# Patient Record
Sex: Female | Born: 1965
Health system: Southern US, Community
[De-identification: ages and names within clinical notes are randomized; demographics above are authoritative.]

## PROBLEM LIST (undated history)

## (undated) DIAGNOSIS — D649 Anemia, unspecified: Secondary | ICD-10-CM

## (undated) DIAGNOSIS — G4730 Sleep apnea, unspecified: Secondary | ICD-10-CM

## (undated) DIAGNOSIS — Z8349 Family history of other endocrine, nutritional and metabolic diseases: Secondary | ICD-10-CM

## (undated) DIAGNOSIS — I10 Essential (primary) hypertension: Secondary | ICD-10-CM

## (undated) DIAGNOSIS — I119 Hypertensive heart disease without heart failure: Secondary | ICD-10-CM

## (undated) DIAGNOSIS — K219 Gastro-esophageal reflux disease without esophagitis: Secondary | ICD-10-CM

## (undated) DIAGNOSIS — T7840XA Allergy, unspecified, initial encounter: Secondary | ICD-10-CM

## (undated) DIAGNOSIS — M1990 Unspecified osteoarthritis, unspecified site: Secondary | ICD-10-CM

## (undated) DIAGNOSIS — F329 Major depressive disorder, single episode, unspecified: Secondary | ICD-10-CM

## (undated) DIAGNOSIS — R519 Headache, unspecified: Secondary | ICD-10-CM

## (undated) DIAGNOSIS — F32A Depression, unspecified: Secondary | ICD-10-CM

## (undated) DIAGNOSIS — R51 Headache: Secondary | ICD-10-CM

## (undated) DIAGNOSIS — F419 Anxiety disorder, unspecified: Secondary | ICD-10-CM

## (undated) HISTORY — DX: Gastro-esophageal reflux disease without esophagitis: K21.9

## (undated) HISTORY — DX: Allergy, unspecified, initial encounter: T78.40XA

## (undated) HISTORY — DX: Anxiety disorder, unspecified: F41.9

## (undated) HISTORY — DX: Depression, unspecified: F32.A

## (undated) HISTORY — DX: Anemia, unspecified: D64.9

## (undated) HISTORY — DX: Major depressive disorder, single episode, unspecified: F32.9

## (undated) HISTORY — PX: WISDOM TOOTH EXTRACTION: SHX21

## (undated) HISTORY — DX: Hypertensive heart disease without heart failure: I11.9

## (undated) HISTORY — DX: Morbid (severe) obesity due to excess calories: E66.01

## (undated) HISTORY — DX: Unspecified osteoarthritis, unspecified site: M19.90

## (undated) HISTORY — DX: Headache, unspecified: R51.9

## (undated) HISTORY — DX: Family history of other endocrine, nutritional and metabolic diseases: Z83.49

## (undated) HISTORY — DX: Sleep apnea, unspecified: G47.30

## (undated) HISTORY — DX: Headache: R51

---

## 1999-04-21 ENCOUNTER — Encounter: Admission: RE | Admit: 1999-04-21 | Discharge: 1999-07-20 | Payer: Self-pay

## 2003-12-14 HISTORY — PX: OTHER SURGICAL HISTORY: SHX169

## 2005-02-24 ENCOUNTER — Emergency Department (HOSPITAL_COMMUNITY): Admission: EM | Admit: 2005-02-24 | Discharge: 2005-02-24 | Payer: Self-pay | Admitting: Emergency Medicine

## 2005-11-12 ENCOUNTER — Other Ambulatory Visit: Admission: RE | Admit: 2005-11-12 | Discharge: 2005-11-12 | Payer: Self-pay | Admitting: Family Medicine

## 2006-01-18 ENCOUNTER — Ambulatory Visit (HOSPITAL_COMMUNITY): Admission: AD | Admit: 2006-01-18 | Discharge: 2006-01-18 | Payer: Self-pay | Admitting: Obstetrics and Gynecology

## 2006-05-03 ENCOUNTER — Encounter: Admission: RE | Admit: 2006-05-03 | Discharge: 2006-05-03 | Payer: Self-pay | Admitting: Family Medicine

## 2006-07-16 ENCOUNTER — Emergency Department (HOSPITAL_COMMUNITY): Admission: EM | Admit: 2006-07-16 | Discharge: 2006-07-16 | Payer: Self-pay | Admitting: Family Medicine

## 2006-09-14 ENCOUNTER — Encounter: Admission: RE | Admit: 2006-09-14 | Discharge: 2006-12-13 | Payer: Self-pay | Admitting: Family Medicine

## 2007-02-13 ENCOUNTER — Other Ambulatory Visit: Admission: RE | Admit: 2007-02-13 | Discharge: 2007-02-13 | Payer: Self-pay | Admitting: Obstetrics and Gynecology

## 2007-06-24 ENCOUNTER — Emergency Department (HOSPITAL_COMMUNITY): Admission: EM | Admit: 2007-06-24 | Discharge: 2007-06-24 | Payer: Self-pay | Admitting: Family Medicine

## 2008-01-26 ENCOUNTER — Other Ambulatory Visit: Admission: RE | Admit: 2008-01-26 | Discharge: 2008-01-26 | Payer: Self-pay | Admitting: Obstetrics and Gynecology

## 2008-06-07 ENCOUNTER — Emergency Department (HOSPITAL_COMMUNITY): Admission: EM | Admit: 2008-06-07 | Discharge: 2008-06-07 | Payer: Self-pay | Admitting: Family Medicine

## 2008-11-03 ENCOUNTER — Emergency Department (HOSPITAL_COMMUNITY): Admission: EM | Admit: 2008-11-03 | Discharge: 2008-11-03 | Payer: Self-pay | Admitting: Family Medicine

## 2010-10-13 ENCOUNTER — Emergency Department (HOSPITAL_COMMUNITY): Admission: EM | Admit: 2010-10-13 | Discharge: 2010-10-14 | Payer: Self-pay | Admitting: Emergency Medicine

## 2010-12-23 ENCOUNTER — Emergency Department (HOSPITAL_COMMUNITY)
Admission: EM | Admit: 2010-12-23 | Discharge: 2010-12-24 | Payer: Self-pay | Source: Home / Self Care | Admitting: Emergency Medicine

## 2010-12-23 ENCOUNTER — Emergency Department (HOSPITAL_COMMUNITY)
Admission: EM | Admit: 2010-12-23 | Discharge: 2010-12-23 | Disposition: A | Discharge status: Other Acute Inpatient Hospital | Payer: Self-pay | Source: Home / Self Care | Admitting: Family Medicine

## 2010-12-28 LAB — POCT I-STAT, CHEM 8
BUN: 6 mg/dL (ref 6–23)
Calcium, Ion: 1.13 mmol/L (ref 1.12–1.32)
Chloride: 105 mEq/L (ref 96–112)
Creatinine, Ser: 0.9 mg/dL (ref 0.4–1.2)
Glucose, Bld: 90 mg/dL (ref 70–99)
HCT: 40 % (ref 36.0–46.0)
Hemoglobin: 13.6 g/dL (ref 12.0–15.0)
Potassium: 3.8 mEq/L (ref 3.5–5.1)
Sodium: 139 mEq/L (ref 135–145)
TCO2: 26 mmol/L (ref 0–100)

## 2010-12-28 LAB — URINALYSIS, ROUTINE W REFLEX MICROSCOPIC
Bilirubin Urine: NEGATIVE
Hgb urine dipstick: NEGATIVE
Ketones, ur: NEGATIVE mg/dL
Nitrite: POSITIVE — AB
Protein, ur: NEGATIVE mg/dL
Specific Gravity, Urine: 1.01 (ref 1.005–1.030)
Urine Glucose, Fasting: NEGATIVE mg/dL
Urobilinogen, UA: 1 mg/dL (ref 0.0–1.0)
pH: 7 (ref 5.0–8.0)

## 2010-12-28 LAB — RAPID URINE DRUG SCREEN, HOSP PERFORMED
Amphetamines: NOT DETECTED
Barbiturates: NOT DETECTED
Benzodiazepines: NOT DETECTED
Cocaine: NOT DETECTED
Opiates: NOT DETECTED
Tetrahydrocannabinol: NOT DETECTED

## 2010-12-28 LAB — URINE CULTURE
Colony Count: >=100000
Culture  Setup Time: 201201112254

## 2010-12-28 LAB — URINE MICROSCOPIC-ADD ON

## 2011-02-23 LAB — POCT I-STAT, CHEM 8
BUN: 7 mg/dL (ref 6–23)
Calcium, Ion: 1.16 mmol/L (ref 1.12–1.32)
Chloride: 101 mEq/L (ref 96–112)
Creatinine, Ser: 0.9 mg/dL (ref 0.4–1.2)
Glucose, Bld: 87 mg/dL (ref 70–99)
HCT: 44 % (ref 36.0–46.0)
Hemoglobin: 15 g/dL (ref 12.0–15.0)
Potassium: 3.4 mEq/L — ABNORMAL LOW (ref 3.5–5.1)
Sodium: 140 mEq/L (ref 135–145)
TCO2: 28 mmol/L (ref 0–100)

## 2011-08-19 ENCOUNTER — Ambulatory Visit (INDEPENDENT_AMBULATORY_CARE_PROVIDER_SITE_OTHER): Payer: Self-pay

## 2011-08-19 ENCOUNTER — Inpatient Hospital Stay (INDEPENDENT_AMBULATORY_CARE_PROVIDER_SITE_OTHER)
Admission: RE | Admit: 2011-08-19 | Discharge: 2011-08-19 | Disposition: A | Discharge status: Home / Self Care | Payer: Self-pay | Source: Ambulatory Visit | Attending: Family Medicine | Admitting: Family Medicine

## 2011-08-19 DIAGNOSIS — J069 Acute upper respiratory infection, unspecified: Secondary | ICD-10-CM

## 2011-09-28 LAB — POCT RAPID STREP A: Streptococcus, Group A Screen (Direct): NEGATIVE

## 2012-02-25 ENCOUNTER — Encounter (HOSPITAL_COMMUNITY): Payer: Self-pay

## 2012-02-25 ENCOUNTER — Emergency Department (INDEPENDENT_AMBULATORY_CARE_PROVIDER_SITE_OTHER): Payer: Self-pay

## 2012-02-25 ENCOUNTER — Emergency Department (INDEPENDENT_AMBULATORY_CARE_PROVIDER_SITE_OTHER)
Admission: EM | Admit: 2012-02-25 | Discharge: 2012-02-25 | Disposition: A | Discharge status: Home / Self Care | Payer: Self-pay | Source: Home / Self Care | Attending: Family Medicine | Admitting: Family Medicine

## 2012-02-25 DIAGNOSIS — IMO0002 Reserved for concepts with insufficient information to code with codable children: Secondary | ICD-10-CM

## 2012-02-25 DIAGNOSIS — M1710 Unilateral primary osteoarthritis, unspecified knee: Secondary | ICD-10-CM

## 2012-02-25 DIAGNOSIS — M25569 Pain in unspecified knee: Secondary | ICD-10-CM

## 2012-02-25 DIAGNOSIS — M170 Bilateral primary osteoarthritis of knee: Secondary | ICD-10-CM

## 2012-02-25 HISTORY — DX: Essential (primary) hypertension: I10

## 2012-02-25 MED ORDER — METOPROLOL TARTRATE 25 MG PO TABS: 25.0000 mg | ORAL_TABLET | Freq: Two times a day (BID) | ORAL | Status: DC

## 2012-02-25 MED ORDER — HYDROCHLOROTHIAZIDE 25 MG PO TABS: 25.0000 mg | ORAL_TABLET | Freq: Every day | ORAL | Status: DC

## 2012-02-25 MED ORDER — HYDROCODONE-ACETAMINOPHEN 5-325 MG PO TABS: ORAL_TABLET | ORAL | Status: AC

## 2012-02-25 NOTE — Discharge Instructions (Signed)
Your x-ray showed evidence of osteoarthritis. I also think that there is a component of patellofemoral syndrome. This is caused by abnormal tracking of her patella or knee. You may take an over-the-counter anti-inflammatory such as Aleve, one to 2 tablets every 12 hours, for baseline pain control. Take the prescribed pain pill, as needed, for pain not well controlled by the baseline medication. Please followup with the orthopedic office listed in your discharge papers. Make an appointment at the next available time. Return to care should your symptoms not improve, or worsen in any way, such as numbness, tingling, or weakness.

## 2012-02-25 NOTE — ED Notes (Signed)
C/o pain in both knees for 2 weeks.  States she has had swelling in the rt knee and lower leg and pain has been worse in both knees for last 2 days.  Denies injury or fall.  BP is elevated here today.  Has not had BP medications in at least one month.

## 2012-02-25 NOTE — ED Notes (Signed)
Dr Juanetta Gosling notified that pt has not taken BP meds in at least one month and BP is elevated here today.

## 2012-03-31 NOTE — ED Provider Notes (Signed)
History     CSN: [id number]  Arrival date & time 02/25/12  1151   First MD Initiated Contact with Patient 02/25/12 1250      Chief Complaint  Patient presents with  . Knee Pain    (Consider location/radiation/quality/duration/timing/severity/associated sxs/prior treatment) HPI Comments: Courtney Bates presents for evaluation of bilateral knee pain. She denies any injury, fall. She reports that she thinks that she has been told before that she has arthritis in her knees. She denies any numbness, tingling, or weakness in her lower extremities.   Patient is a 46 y.o. female presenting with knee pain. The history is provided by the patient.  Knee Pain This is a recurrent problem. The current episode started more than 1 week ago. The problem has not changed since onset.The symptoms are aggravated by bending and exertion. The symptoms are relieved by nothing. She has tried acetaminophen for the symptoms.    Past Medical History  Diagnosis Date  . Hypertension     History reviewed. No pertinent past surgical history.  No family history on file.  History  Substance Use Topics  . Smoking status: Never Smoker   . Smokeless tobacco: Not on file  . Alcohol Use: No    OB History    Grav Para Term Preterm Abortions TAB SAB Ect Mult Living                  Review of Systems  Constitutional: Negative.   HENT: Negative.   Eyes: Negative.   Respiratory: Negative.   Cardiovascular: Negative.   Gastrointestinal: Negative.   Genitourinary: Negative.   Musculoskeletal:       Bilateral knee pain  Skin: Negative.   Neurological: Negative.     Allergies  Penicillins  Home Medications   Current Outpatient Rx  Name Route Sig Dispense Refill  . METOPROLOL TARTRATE 25 MG PO TABS Oral Take 25 mg by mouth daily.    Marland Kitchen HYDROCHLOROTHIAZIDE 25 MG PO TABS Oral Take 1 tablet (25 mg total) by mouth daily. 30 tablet 0  . METOPROLOL TARTRATE 25 MG PO TABS Oral Take 1 tablet (25 mg total) by mouth 2  (two) times daily. 60 tablet 0    BP 180/107  Pulse 98  Temp(Src) 99.8 F (37.7 C) (Oral)  Resp 16  SpO2 98%  LMP 02/11/2012  Physical Exam  Nursing note and vitals reviewed. Constitutional: She is oriented to person, place, and time. She appears well-developed and well-nourished.  HENT:  Head: Normocephalic and atraumatic.  Eyes: EOM are normal.  Neck: Normal range of motion.  Pulmonary/Chest: Effort normal.  Musculoskeletal: Normal range of motion.       Right knee: She exhibits swelling. She exhibits no effusion, no ecchymosis, no LCL laxity, normal meniscus and no MCL laxity. tenderness found. Medial joint line and lateral joint line tenderness noted.       Knee: negative patellar grind, negative patellar facet tenderness, positive medial and lateral joint line tenderness, negative patellar tendon tenderness, negative Lachman's, negative McMurray's; no laxity or pain with varus or valgus stress   Neurological: She is alert and oriented to person, place, and time.  Skin: Skin is warm and dry.  Psychiatric: Her behavior is normal.    ED Course  Procedures (including critical care time)  Labs Reviewed - No data to display No results found.   1. Knee pain   2. Osteoarthritis of both knees       MDM  Xray reviewed by radiologist and myself; no  acute abnormalities        Renaee Munda, MD 03/31/12 1344

## 2012-10-19 ENCOUNTER — Ambulatory Visit: Payer: Self-pay | Admitting: Medical

## 2014-01-11 ENCOUNTER — Encounter: Payer: Self-pay | Admitting: Medical

## 2014-01-11 ENCOUNTER — Ambulatory Visit (INDEPENDENT_AMBULATORY_CARE_PROVIDER_SITE_OTHER): Payer: BC Managed Care – PPO | Admitting: Medical

## 2014-01-11 VITALS — BP 220/110 | HR 100 | Temp 98.3°F | Resp 16 | Wt 258.0 lb

## 2014-01-11 DIAGNOSIS — I10 Essential (primary) hypertension: Secondary | ICD-10-CM

## 2014-01-11 DIAGNOSIS — R9431 Abnormal electrocardiogram [ECG] [EKG]: Secondary | ICD-10-CM

## 2014-01-11 DIAGNOSIS — Z8349 Family history of other endocrine, nutritional and metabolic diseases: Secondary | ICD-10-CM

## 2014-01-11 DIAGNOSIS — K219 Gastro-esophageal reflux disease without esophagitis: Secondary | ICD-10-CM

## 2014-01-11 DIAGNOSIS — E669 Obesity, unspecified: Secondary | ICD-10-CM

## 2014-01-11 LAB — POCT URINALYSIS DIPSTICK
Bilirubin, UA: NEGATIVE
Blood, UA: NEGATIVE
Glucose, UA: NEGATIVE
Ketones, UA: NEGATIVE
Leukocytes, UA: NEGATIVE
Nitrite, UA: NEGATIVE
Protein, UA: NEGATIVE
Spec Grav, UA: 1.015
Urobilinogen, UA: NEGATIVE
pH, UA: 6

## 2014-01-11 LAB — CBC
HCT: 41.6 % (ref 36.0–46.0)
Hemoglobin: 14.1 g/dL (ref 12.0–15.0)
MCH: 27.5 pg (ref 26.0–34.0)
MCHC: 33.9 g/dL (ref 30.0–36.0)
MCV: 81.1 fL (ref 78.0–100.0)
Platelets: 353 10*3/uL (ref 150–400)
RBC: 5.13 MIL/uL — ABNORMAL HIGH (ref 3.87–5.11)
RDW: 14.2 % (ref 11.5–15.5)
WBC: 8.8 10*3/uL (ref 4.0–10.5)

## 2014-01-11 LAB — BASIC METABOLIC PANEL
BUN: 11 mg/dL (ref 6–23)
CO2: 26 mEq/L (ref 19–32)
Calcium: 9.1 mg/dL (ref 8.4–10.5)
Chloride: 104 mEq/L (ref 96–112)
Creat: 0.82 mg/dL (ref 0.50–1.10)
Glucose, Bld: 83 mg/dL (ref 70–99)
Potassium: 4 mEq/L (ref 3.5–5.3)
Sodium: 138 mEq/L (ref 135–145)

## 2014-01-11 MED ORDER — OMEPRAZOLE 40 MG PO CPDR: DELAYED_RELEASE_CAPSULE | ORAL | Status: DC

## 2014-01-11 MED ORDER — METOPROLOL TARTRATE 25 MG PO TABS: 25.0000 mg | ORAL_TABLET | Freq: Two times a day (BID) | ORAL | Status: DC

## 2014-01-11 MED ORDER — HYDROCHLOROTHIAZIDE 25 MG PO TABS: 25.0000 mg | ORAL_TABLET | Freq: Every day | ORAL | Status: DC

## 2014-01-11 NOTE — Patient Instructions (Signed)
Begin back on metoprolol 25 mg twice daily.  Begin hydrochlorothiazide fluid pill once daily in the morning.  Limit salt intake  Eating a healthy low-fat diet   Limit fast food and eating out.  We will refer you to cardiology for further evaluation.  Hypertension As your heart beats, it forces blood through your arteries. This force is your blood pressure. If the pressure is too high, it is called hypertension (HTN) or high blood pressure. HTN is dangerous because you may have it and not know it. High blood pressure may mean that your heart has to work harder to pump blood. Your arteries may be narrow or stiff. The extra work puts you at risk for heart disease, stroke, and other problems.  Blood pressure consists of two numbers, a higher number over a lower, 110/72, for example. It is stated as "110 over 72." The ideal is below 120 for the top number (systolic) and under 80 for the bottom (diastolic). Write down your blood pressure today. You should pay close attention to your blood pressure if you have certain conditions such as:  Heart failure.  Prior heart attack.  Diabetes  Chronic kidney disease.  Prior stroke.  Multiple risk factors for heart disease. To see if you have HTN, your blood pressure should be measured while you are seated with your arm held at the level of the heart. It should be measured at least twice. A one-time elevated blood pressure reading (especially in the Emergency Department) does not mean that you need treatment. There may be conditions in which the blood pressure is different between your right and left arms. It is important to see your caregiver soon for a recheck. Most people have essential hypertension which means that there is not a specific cause. This type of high blood pressure may be lowered by changing lifestyle factors such as:  Stress.  Smoking.  Lack of exercise.  Excessive weight.  Drug/tobacco/alcohol use.  Eating less salt. Most  people do not have symptoms from high blood pressure until it has caused damage to the body. Effective treatment can often prevent, delay or reduce that damage. TREATMENT  When a cause has been identified, treatment for high blood pressure is directed at the cause. There are a large number of medications to treat HTN. These fall into several categories, and your caregiver will help you select the medicines that are best for you. Medications may have side effects. You should review side effects with your caregiver. If your blood pressure stays high after you have made lifestyle changes or started on medicines,   Your medication(s) may need to be changed.  Other problems may need to be addressed.  Be certain you understand your prescriptions, and know how and when to take your medicine.  Be sure to follow up with your caregiver within the time frame advised (usually within two weeks) to have your blood pressure rechecked and to review your medications.  If you are taking more than one medicine to lower your blood pressure, make sure you know how and at what times they should be taken. Taking two medicines at the same time can result in blood pressure that is too low. SEEK IMMEDIATE MEDICAL CARE IF:  You develop a severe headache, blurred or changing vision, or confusion.  You have unusual weakness or numbness, or a faint feeling.  You have severe chest or abdominal pain, vomiting, or breathing problems. MAKE SURE YOU:   Understand these instructions.  Will watch your  condition.  Will get help right away if you are not doing well or get worse. Document Released: 11/29/2005 Document Revised: 02/21/2012 Document Reviewed: 07/19/2008 [facility] Patient Information 2014 [place], [place].   Gastroesophageal Reflux Disease, Adult Gastroesophageal reflux disease (GERD) happens when acid from your stomach flows up into the esophagus. When acid comes in contact with the esophagus, the acid causes  soreness (inflammation) in the esophagus. Over time, GERD may create small holes (ulcers) in the lining of the esophagus. CAUSES   Increased body weight. This puts pressure on the stomach, making acid rise from the stomach into the esophagus.   Smoking. This increases acid production in the stomach.   Drinking alcohol. This causes decreased pressure in the lower esophageal sphincter (valve or ring of muscle between the esophagus and stomach), allowing acid from the stomach into the esophagus.   Late evening meals and a full stomach. This increases pressure and acid production in the stomach.   A malformed lower esophageal sphincter.  Sometimes, no cause is found. SYMPTOMS   Burning pain in the lower part of the mid-chest behind the breastbone and in the mid-stomach area. This may occur twice a week or more often.   Trouble swallowing.   Sore throat.   Dry cough.   Asthma-like symptoms including chest tightness, shortness of breath, or wheezing.  DIAGNOSIS  Your caregiver may be able to diagnose GERD based on your symptoms. In some cases, X-rays and other tests may be done to check for complications or to check the condition of your stomach and esophagus. TREATMENT  Your caregiver may recommend over-the-counter or prescription medicines to help decrease acid production. Ask your caregiver before starting or adding any new medicines.  HOME CARE INSTRUCTIONS   Change the factors that you can control. Ask your caregiver for guidance concerning weight loss, quitting smoking, and alcohol consumption.   Avoid foods and drinks that make your symptoms worse, such as:   Caffeine or alcoholic drinks.   Chocolate.   Peppermint or mint flavorings.   Garlic and onions.   Spicy foods.   Citrus fruits, such as oranges, lemons, or limes.   Tomato-based foods such as sauce, chili, salsa, and pizza.   Fried and fatty foods.   Avoid lying down for the 3 hours prior to your bedtime or  prior to taking a nap.   Eat small, frequent meals instead of large meals.   Wear loose-fitting clothing. Do not wear anything tight around your waist that causes pressure on your stomach.   Raise the head of your bed 6 to 8 inches with wood blocks to help you sleep. Extra pillows will not help.   Only take over-the-counter or prescription medicines for pain, discomfort, or fever as directed by your caregiver.   Do not take aspirin, ibuprofen, or other nonsteroidal anti-inflammatory drugs (NSAIDs).  SEEK IMMEDIATE MEDICAL CARE IF:   You have pain in your arms, neck, jaw, teeth, or back.   Your pain increases or changes in intensity or duration.   You develop nausea, vomiting, or sweating (diaphoresis).   You develop shortness of breath, or you faint.   Your vomit is green, yellow, black, or looks like coffee grounds or blood.   Your stool is red, bloody, or black.  These symptoms could be signs of other problems, such as heart disease, gastric bleeding, or esophageal bleeding. MAKE SURE YOU:   Understand these instructions.   Will watch your condition.   Will get help right away if  you are not doing well or get worse.  Document Released: 09/08/2005 Document Revised: 08/11/2011 Document Reviewed: 06/18/2011 [facility] Patient Information 2012 [place], [place].

## 2014-01-11 NOTE — Progress Notes (Signed)
  Subjective:  Courtney Bates is a 48 y.o. female who presents as a new patient today.  No routine medical care in the last 6 years.    She notes a history of hypertension diagnosed about 6 or 7 years ago, was on medication initially, but after losing her insurance, she has been out of medicine for at least 6 years.  She just recently got insurance, and at her earliest chance wanted to get in to see a doctor and get back on blood pressure medication. She checks her blood pressures on a regular basis, and she routinely sees systolic's in the 200s, diastolic in the 100s.  She does get some chest palpitations, chest pressure at times, gets frequent headaches.  She does get blurred vision somewhat regularly. She denies numbness, tingling, weakness, vision loss, dark cola urine, no SOB, no edema, no DOE.   She uses no diet or salt discretion. Her prior medication regimen included metoprolol and HCTZ.  She also reports bad reflux, history of GERD.  No other c/o.  The following portions of the patient's history were reviewed and updated as appropriate: allergies, current medications, past family history, past medical history, past social history, past surgical history and problem list.  ROS Otherwise as in subjective above  Objective: Physical Exam  BP 220/110  Pulse 100  Temp(Src) 98.3 F (36.8 C) (Oral)  Resp 16  Wt 258 lb (117.028 kg)   General appearance: alert, no distress, WD/WN, obese female Neck: supple, no lymphadenopathy, no thyromegaly, no masses, no bruits, visible right neck pulsations with heart beat, but no frank JVD Heart: RRR, normal S1, S2, no murmurs Lungs: CTA bilaterally, no wheezes, rhonchi, or rales Abdomen: +bs, soft, non tender, non distended, no masses, no hepatomegaly, no splenomegaly Pulses: 2+ radial pulses, 2+ pedal pulses, normal cap refill Ext: no edema Neuro: nonfocal exam    Adult ECG Report  Indication: uncontrolled HTN  Rate: 93bpm  Rhythm: normal sinus rhythm  QRS Axis: 19 degrees  PR Interval: 162ms  QRS Duration: 88ms  QTc: 457ms  Conduction Disturbances: possible age undetermined septal infarct givne leads V2, V3 pattern  Other Abnormalities: voltage criteria for LVH  Patient's cardiac risk factors are: hypertension, obesity (BMI >= 30 kg/m2) and sedentary lifestyle.  EKG comparison: none  Narrative Interpretation: LVH, possible prior septal infarct    Assessment: Encounter Diagnoses  Name Primary?  . Essential hypertension, benign Yes  . Obesity, unspecified   . Family history of thyroid disease     Plan: Reviewed EKG, labs today, labs today.  Discussed risks of uncontrolled HTN.  Discussed the significance of her BP findings today.  Will restart her on a BP medication, discussed diet, need for weight loss, salt avoidance, hydration, compliance.  Given her likely LVH, chest pressure, palpiations and uncontrolled BPs, referral to cardiology.  otherwise follow up: pending labs, 1 wk  Begin omeprazole, GERD diet for heartburn and GERD.

## 2014-01-12 LAB — TSH: TSH: 1.106 u[IU]/mL (ref 0.350–4.500)

## 2014-01-15 ENCOUNTER — Telehealth: Payer: Self-pay | Admitting: Family Medicine

## 2014-01-15 NOTE — Telephone Encounter (Signed)
I left the patient a message on her voicemail about her cardiology appointment. CLS 01/17/14 @ 230 pm with Dr. Donnie Ahoilley.

## 2014-01-15 NOTE — Telephone Encounter (Signed)
Message copied by Janeice RobinsonSCALES, Legend Pecore L on Tue Jan 15, 2014 10:01 AM ------      Message from: Jac CanavanYSINGER, DAVID S      Created: Fri Jan 11, 2014  2:36 PM       Refer to Dr. Donnie Ahoilley for 6+ year hx/o uncontrolled hypertension, abnormal EKG, chest discomfort ------

## 2014-01-17 ENCOUNTER — Encounter: Payer: Self-pay | Admitting: Cardiology

## 2014-01-22 ENCOUNTER — Ambulatory Visit: Payer: BC Managed Care – PPO | Admitting: Medical

## 2014-01-30 ENCOUNTER — Other Ambulatory Visit: Payer: Self-pay | Admitting: Cardiology

## 2014-01-30 ENCOUNTER — Encounter: Payer: Self-pay | Admitting: Cardiology

## 2014-01-30 ENCOUNTER — Ambulatory Visit (INDEPENDENT_AMBULATORY_CARE_PROVIDER_SITE_OTHER): Payer: BC Managed Care – PPO | Admitting: Medical

## 2014-01-30 VITALS — BP 112/88 | HR 100 | Temp 98.5°F | Resp 16 | Wt 256.0 lb

## 2014-01-30 DIAGNOSIS — I10 Essential (primary) hypertension: Secondary | ICD-10-CM | POA: Insufficient documentation

## 2014-01-30 DIAGNOSIS — R05 Cough: Secondary | ICD-10-CM

## 2014-01-30 DIAGNOSIS — E669 Obesity, unspecified: Secondary | ICD-10-CM

## 2014-01-30 DIAGNOSIS — B349 Viral infection, unspecified: Secondary | ICD-10-CM

## 2014-01-30 DIAGNOSIS — R197 Diarrhea, unspecified: Secondary | ICD-10-CM

## 2014-01-30 DIAGNOSIS — R600 Localized edema: Secondary | ICD-10-CM

## 2014-01-30 DIAGNOSIS — H6121 Impacted cerumen, right ear: Secondary | ICD-10-CM

## 2014-01-30 DIAGNOSIS — R609 Edema, unspecified: Secondary | ICD-10-CM

## 2014-01-30 DIAGNOSIS — K219 Gastro-esophageal reflux disease without esophagitis: Secondary | ICD-10-CM

## 2014-01-30 DIAGNOSIS — H6120 Impacted cerumen, unspecified ear: Secondary | ICD-10-CM

## 2014-01-30 DIAGNOSIS — J029 Acute pharyngitis, unspecified: Secondary | ICD-10-CM

## 2014-01-30 DIAGNOSIS — R059 Cough, unspecified: Secondary | ICD-10-CM

## 2014-01-30 DIAGNOSIS — I517 Cardiomegaly: Secondary | ICD-10-CM

## 2014-01-30 DIAGNOSIS — B9789 Other viral agents as the cause of diseases classified elsewhere: Secondary | ICD-10-CM

## 2014-01-30 LAB — COMPREHENSIVE METABOLIC PANEL
ALT: 8 U/L (ref 0–35)
AST: 13 U/L (ref 0–37)
Albumin: 4.1 g/dL (ref 3.5–5.2)
Alkaline Phosphatase: 73 U/L (ref 39–117)
BUN: 18 mg/dL (ref 6–23)
CO2: 30 mEq/L (ref 19–32)
Calcium: 9.9 mg/dL (ref 8.4–10.5)
Chloride: 99 mEq/L (ref 96–112)
Creat: 1.1 mg/dL (ref 0.50–1.10)
Glucose, Bld: 85 mg/dL (ref 70–99)
Potassium: 4.3 mEq/L (ref 3.5–5.3)
Sodium: 135 mEq/L (ref 135–145)
Total Bilirubin: 0.5 mg/dL (ref 0.2–1.2)
Total Protein: 6.6 g/dL (ref 6.0–8.3)

## 2014-01-30 MED ORDER — HYDROCODONE-HOMATROPINE 5-1.5 MG/5ML PO SYRP: 5.0000 mL | ORAL_SOLUTION | Freq: Three times a day (TID) | ORAL | Status: DC | PRN

## 2014-01-30 MED ORDER — AMLODIPINE BESYLATE 5 MG PO TABS: 5.0000 mg | ORAL_TABLET | Freq: Two times a day (BID) | ORAL | Status: DC

## 2014-01-30 MED ORDER — FUROSEMIDE 40 MG PO TABS: 40.0000 mg | ORAL_TABLET | Freq: Every day | ORAL | Status: DC

## 2014-01-30 MED ORDER — LISINOPRIL 10 MG PO TABS: 10.0000 mg | ORAL_TABLET | Freq: Every day | ORAL | Status: DC

## 2014-01-30 NOTE — Progress Notes (Unsigned)
Patient ID: VALERYE KOBUS, female   DOB: 04-Jul-1966, 48 y.o.   MRN: [id number]   Lavaya, Defreitas    Date of visit:  01/30/2014 DOB:  [birth year]    Age:  47 yrs. Medical record number: [id number] Account number:  [id number] Primary Care Provider: Yvetta Coder ____________________________ CURRENT DIAGNOSES  1. Hypertensive Heart Disease-Benign without CHF  2. GERD  3. Obesity, morbid (BMI>40) ____________________________ ALLERGIES  Penicillins, Intolerance-unknown ____________________________ MEDICATIONS  1. hydrochlorothiazide 25 mg tablet, 1 p.o. daily  2. omeprazole 40 mg capsule,delayed release, 1 p.o. daily  3. metoprolol tartrate 25 mg tablet, BID  4. amlodipine 5 mg tablet, BID  5. lisinopril 10 mg tablet, 1 p.o. daily  6. furosemide 40 mg tablet, 1 p.o. daily ____________________________ HISTORY OF PRESENT ILLNESS Patient seen for cardiac followup. She has an echocardiogram done today at the office that shows an ejection fraction of 60% with moderate concentric LVH. She is feeling much better and her headaches and dyspnea have improved. She had her blood pressure checked at a drug store and noted it was under much better control but has not yet gotten a home blood pressure monitor. Her blood pressure in the office was much better today. ____________________________ PAST HISTORY  Past Medical Illnesses:  hypertension, morbid obesity, GERD;  Cardiovascular Illnesses:  no previous history of cardiac disease.;  Surgical Procedures:  no prior surgical procedures;  Cardiology Procedures-Invasive:  no history of prior cardiac procedures;  Cardiology Procedures-Noninvasive:  echocardiogram February 2015;  LVEF of 60% documented via echocardiogram on 01/30/2014,   ____________________________ CARDIO-PULMONARY TEST DATES EKG Date:  01/17/2014;  Echocardiography Date: 01/30/2014;   ____________________________ FAMILY HISTORY Brother -- Brother alive and well Father -- Father dead,  Cancer Mother -- Mother alive with problem, Hypertension Sister -- Sister alive and well Sister -- Sister alive and well ____________________________ SOCIAL HISTORY Alcohol Use:  no alcohol use;  Smoking:  never smoked;  Diet:  regular diet;  Lifestyle:  1 son, 1 daughter and single;  Exercise:  no regular exercise;  Occupation:  sewing;  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  Integumentary:recurrent rash Ears, Nose, Throat, Mouth:  denies any hearing loss, epistaxis, hoarseness or difficulty speaking. Respiratory: denies dyspnea, cough, wheezing or hemoptysis. Cardiovascular:  please review HPI Abdominal: denies dyspepsia, GI bleeding, constipation, or diarrhea Musculoskeletal:  arthritis of the knees Neurological:  denies headaches, stroke, or TIA,.  ____________________________ PHYSICAL EXAMINATION VITAL SIGNS  Blood Pressure:  140/90 Sitting, Left arm, regular cuff   Pulse:  90/min. Weight:  256.00 lbs. Height:  66"BMI: 41  Constitutional:  pleasant African American female in no acute distress, severely obese Head:  normocephalic, normal hair pattern, no masses or tenderness Chest:  normal symmetry, clear to auscultation. Cardiac:  regular rhythm, normal S1 and S2, No S3 or S4, no murmurs, gallops or rubs detected. Extremities & Back:  no deformities, clubbing, cyanosis, erythema or edema observed. Normal muscle strength and tone. Neurological:  no gross motor or sensory deficits noted, affect appropriate, oriented x3. ____________________________ IMPRESSIONS/PLAN   1. Hypertensive heart disease with recent accelerated hypertension currently under better control  2. Significant LVH likely due to uncontrolled hypertension 3. Morbid obesity  Recommendations:  She is to see her primary provider this afternoon. I will ask him to check a comprehensive metabolic  panel on her to check her potassium and also to get a chest x-ray as a  baseline for her heart size. I encouraged her to continue with her weight loss and good dietary issues. She has much better blood pressure control and likely will need to be on a multidrug regimen until she has been able to lose some weight. I will see her in followup in 6 weeks. ____________________________ TODAYS ORDERS  1. Return Visit: 6 weeks                       ____________________________ Cardiology Physician:  Darden PalmerW. Spencer Charliene Inoue, Jr. MD [facility]

## 2014-01-30 NOTE — Progress Notes (Signed)
                                                             Subjective:  Courtney Bates is a 48 y.o. female who presents for recheck.  I saw her recently as a new patient.  She notes 2 day hx/o sore throat, nagging frequency cough, clear production, right sided neck tenderness, lymph nodes tender, ear pressure on the right. Has had several episodes of loose stool today.  denies fever, nausea, vomiting, sinus pressure.  No SOB or wheezing.  After her recent first visit, I had her see cardiology Dr. Donnie Ahoilley for uncontrolled long history of hypertension.  She has been started on additional medications (amlodipine, lisinopril, lasix).  BP has been looking better.  She had a good visit with him, was appreciative of the referral.    She did start Omeprazole and using diet trigger avoidance for GERD.  Has seen improvement.   No other c/o.  The following portions of the patient's history were reviewed and updated as appropriate: allergies, current medications, past family history, past medical history, past social history, past surgical history and problem list.  ROS Otherwise as in subjective above  Objective: Physical Exam  BP 112/88  Pulse 100  Temp(Src) 98.5 F (36.9 C) (Oral)  Resp 16  Wt 256 lb (116.121 kg)   General appearance: alert, no distress, WD/WN HEENT: conjunctiva pink, impacted cerumen right ear canal, left TM with serous effusion, mildly erythematous pharynx, no exudate, turbinates swollen and pink, clear discharge Oral: MMM, no lesions Neck: supple, no lymphadenopathy, no thyromegaly, no masses, no bruits, visible right neck pulsations with heart beat, but no frank JVD Heart: RRR, normal S1, S2, no murmurs Lungs: CTA bilaterally, no wheezes, rhonchi, or rales Pulses: 2+ radial pulses, 2+ pedal pulses, normal cap refill Ext: no edema     Assessment: Encounter Diagnoses  Name Primary?  . Essential hypertension, benign Yes  . Obesity, unspecified   . LVH (left  ventricular hypertrophy)   . GERD (gastroesophageal reflux disease)   . Leg edema   . Viral syndrome   . Diarrhea   . Sore throat   . Cough   . Impacted cerumen of right ear     Plan: HTN - improved on current multiple drug regimen.  C/t same medication.  Labs and CXR today per Dr. Donnie Ahoilley Obesity - she is working on lifestyle changes LVH - reviewed cardiology notes, echo notes, will send for CXR GERD - c/t omeprazole, trigger avoidance Leg edema - improved on current regimen Viral syndrome, diarrhea, sore throat, cough - hycodan prn, discussed supportive care, signs/symptoms that would prompt recheck Impacted cerumen - discussed risks/benefits, and at her consent, used warm water lavage to remove cerumen from right ear canal.  Tolerated procedure well.  TM appeared flat, serous effusion, no erythema.   F/u pending lab, xray, 23mo

## 2014-01-30 NOTE — Progress Notes (Signed)
Patient ID: Courtney Bates, female   DOB: 06-Nov-1966, 48 y.o.   MRN: [id number]   Courtney Bates, Courtney Bates    Date of visit:  01/17/2014 DOB:  1966/05/17    Age:  47 yrs. Medical record number:  [id number]  Account number:  [id number] Primary Care Provider: Yvetta Coder ____________________________ CURRENT DIAGNOSES  1. Palpitations  2. Hypertensive Heart Disease-Benign without CHF  3. GERD  4. Obesity, morbid (BMI>40) ____________________________ ALLERGIES  Penicillins, Intolerance-unknown ____________________________ MEDICATIONS  1. hydrochlorothiazide 25 mg tablet, 1 p.o. daily  2. omeprazole 40 mg capsule,delayed release, 1 p.o. daily  3. metoprolol tartrate 25 mg tablet, BID  4. amlodipine 5 mg tablet, BID  5. lisinopril 10 mg tablet, 1 p.o. daily  6. furosemide 40 mg tablet, 1 p.o. daily ____________________________ CHIEF COMPLAINTS  Hypertension  palpatation ____________________________ HISTORY OF PRESENT ILLNESS This very nice 48 year old black female is seen for evaluation of hypertensive heart disease and uncontrolled blood pressure. The patient has a seven-year history of hypertension but stopped taking her medicines about a year ago after she lost her insurance. She also describes a time where she gained a lot of weight as a result of a lot of partying and alcohol use but has stopped doing that now. She thinks that she has gained about 25 pounds in the past year. She was recently seen and found to have markedly uncontrolled blood pressure. She was started on treatment with hydrochlorothiazide and a beta blocker. She denies chest pain but does have some mild edema and complains of occasional palpitations. She has mild dyspnea with exertion. She has no PND, orthopnea or claudication. She has been using nonsteroidal anti-inflammatory agents and even used some this morning. She has been complaining of headaches. She tries to restrict the salt in her diet. She is not currently using  alcohol and has never used cocaine. ____________________________ PAST HISTORY  Past Medical Illnesses:  hypertension, morbid obesity, GERD;  Cardiovascular Illnesses:  no previous history of cardiac disease.;  Surgical Procedures:  no prior surgical procedures;  Cardiology Procedures-Invasive:  no history of prior cardiac procedures;  Cardiology Procedures-Noninvasive:  no previous non-invasive procedures;  LVEF not documented,   ____________________________ CARDIO-PULMONARY TEST DATES EKG Date:  01/17/2014;   ____________________________ FAMILY HISTORY Brother -- Brother alive and well Father -- Father dead, Cancer Mother -- Mother alive with problem, Hypertension Sister -- Sister alive and well Sister -- Sister alive and well ____________________________ SOCIAL HISTORY Alcohol Use:  no alcohol use;  Smoking:  never smoked;  Diet:  regular diet;  Lifestyle:  1 son, 1 daughter and single;  Exercise:  no regular exercise;  Occupation:  sewing;  Residence:  lives with daughter;   ____________________________ REVIEW OF SYSTEMS General:  weight gain of approximately 25 lbs  Integumentary:recurrent rash Eyes: denies diplopia, history of glaucoma or visual problems. Ears, Nose, Throat, Mouth:  denies any hearing loss, epistaxis, hoarseness or difficulty speaking. Respiratory: dyspnea with exertion Cardiovascular:  please review HPI Abdominal: denies dyspepsia, GI bleeding, constipation, or diarrheaGenitourinary-Female: no dysuria, urgency, frequency, UTIs, or stress incontinence Musculoskeletal:  arthritis of the knees Neurological:  headaches Psychiatric:  denies depession or anxiety.  ____________________________ PHYSICAL EXAMINATION VITAL SIGNS  Blood Pressure:  230/110 Sitting, Right arm, large cuff  , 228/120 Standing, Right arm and regular cuff   Pulse:  90/min. Weight:  258.00 lbs. Height:  66"BMI: 41  Constitutional:  pleasant African American female in no acute distress, severely  obese Skin:  warm and dry to touch, no  apparent skin lesions, or masses noted. Head:  normocephalic, normal hair pattern, no masses or tenderness Eyes:  EOMS Intact, PERRLA, C and S clear, Funduscopic exam not done. ENT:  ears, nose and throat reveal no gross abnormalities.  Dentition good. Neck:  supple, without massess. No JVD, thyromegaly or carotid bruits. Carotid upstroke normal. Chest:  normal symmetry, clear to auscultation. Cardiac:  regular rhythm, normal S1 and S2, No S3 or S4, no murmurs, gallops or rubs detected. Abdomen:  abdomen soft,non-tender, no masses, no hepatospenomegaly, or aneurysm noted Peripheral Pulses:  the femoral,dorsalis pedis, and posterior tibial pulses are full and equal bilaterally with no bruits auscultated. Extremities & Back:  1+ edema Neurological:  no gross motor or sensory deficits noted, affect appropriate, oriented x3. ____________________________ IMPRESSIONS/PLAN  1. Hypertensive heart disease with accelerated hypertension 2. Morbid obesity  Recommendations:  Her blood pressure was quite elevated today. We discussed lifestyle changes such as the importance of weight loss and discussed following a calorie and carbohydrate restricted diet. We discussed hypertension as well as endorgan damage and I would like for her in view of her abnormal EKG with LVH and ST-T wave changes to obtain an echocardiogram.  We discussed avoiding nonsteroidal anti-inflammatory agents as they will worsen blood pressure. She had some mild edema today and I recommended that she initiate treatment with furosemide 40 mg daily and that she begin amlodipine 5 mg twice daily and lisinopril 10 mg daily. I will see her back in followup in 2 weeks. I asked her to obtain a home blood pressure monitor and monitor her blood pressure at home. ____________________________ TODAYS ORDERS  1. 2D, color flow, doppler: 2 weeks  2. Return Visit: 2 weeks  3. 12 Lead EKG: Today                        ____________________________ Cardiology Physician:  Darden PalmerW. Spencer Vivien Barretto, Jr. MD [facility]

## 2014-01-31 ENCOUNTER — Ambulatory Visit
Admission: RE | Admit: 2014-01-31 | Discharge: 2014-01-31 | Disposition: A | Discharge status: Home / Self Care | Payer: BC Managed Care – PPO | Source: Ambulatory Visit | Attending: Medical | Admitting: Medical

## 2014-01-31 DIAGNOSIS — I517 Cardiomegaly: Secondary | ICD-10-CM

## 2014-01-31 DIAGNOSIS — I10 Essential (primary) hypertension: Secondary | ICD-10-CM

## 2014-02-06 ENCOUNTER — Encounter: Payer: Self-pay | Admitting: Medical

## 2014-03-19 ENCOUNTER — Encounter: Payer: Self-pay | Admitting: Cardiology

## 2014-03-19 NOTE — Progress Notes (Signed)
Patient ID: Courtney Bates, female   DOB: 10/09/1966, 48 y.o.   MRN: [id number]   Courtney Bates, Courtney Bates    Date of visit:  03/19/2014 DOB:  10/09/1966    Age:  48 yrs. Medical record number:  [id number] Account number:  [id number] Primary Care Provider: Yvetta CoderYSINGER, D. SHANE ____________________________ CURRENT DIAGNOSES  1. Hypertensive Heart Disease-Benign without CHF  2. Palpitations  3. GERD  4. Obesity, morbid (BMI>40) ____________________________ ALLERGIES  Penicillins, Intolerance-unknown ____________________________ MEDICATIONS  1. hydrochlorothiazide 25 mg tablet, 1 p.o. daily  2. omeprazole 40 mg capsule,delayed release, 1 p.o. daily  3. lisinopril 10 mg tablet, 1 p.o. daily  4. furosemide 40 mg tablet, 1 p.o. daily  5. amlodipine 5 mg tablet, Daily  6. metoprolol tartrate 50 mg tablet, BID ____________________________ CHIEF COMPLAINTS  Followup of Hypertensive Heart Disease-Benign without CHF ____________________________ HISTORY OF PRESENT ILLNESS Patient seen for cardiac followup. She has noticed frequent palpitations over the past month and is concerned about that. Her blood pressure has been under good control. She is feeling mildly nauseous today and thinks she is getting a bone. She has been able to lose about 3 pounds of weight. She denies angina or other symptoms. ____________________________ PAST HISTORY  Past Medical Illnesses:  hypertension, morbid obesity, GERD;  Cardiovascular Illnesses:  no previous history of cardiac disease.;  Surgical Procedures:  no prior surgical procedures;  Cardiology Procedures-Invasive:  no history of prior cardiac procedures;  Cardiology Procedures-Noninvasive:  echocardiogram February 2015;  LVEF of 60% documented via echocardiogram on 01/30/2014,   ____________________________ FAMILY HISTORY Brother -- Brother alive and well Father -- Father dead, Cancer Mother -- Mother alive with problem, Hypertension Sister -- Sister alive and  well Sister -- Sister alive and well ____________________________ SOCIAL HISTORY Alcohol Use:  no alcohol use;  Smoking:  never smoked;  Diet:  regular diet;  Lifestyle:  1 son, 1 daughter and single;  Exercise:  no regular exercise;  Occupation:  sewing;  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  Integumentary:recurrent rash Respiratory: denies dyspnea, cough, wheezing or hemoptysis. Cardiovascular:  please review HPI Abdominal: anorexia Musculoskeletal:  arthritis of the knees Neurological:  denies headaches, stroke, or TIA,.  ____________________________ PHYSICAL EXAMINATION VITAL SIGNS  Blood Pressure:  124/80 Standing, Left arm, large cuff  , 120/78 Sitting, Left arm and large cuff   Pulse:  96/min. Weight:  253.00 lbs. Height:  66"BMI: 41  Constitutional:  pleasant African American female in no acute distress, severely obese Head:  normocephalic, normal hair pattern, no masses or tenderness Chest:  normal symmetry, clear to auscultation. Cardiac:  regular rhythm, normal S1 and S2, No S3 or S4, no murmurs, gallops or rubs detected. Extremities & Back:  no deformities, clubbing, cyanosis, erythema or edema observed. Normal muscle strength and tone. Neurological:  no gross motor or sensory deficits noted, affect appropriate, oriented x3. ____________________________ IMPRESSIONS/PLAN  1. Palpitations of uncertain cause 2. Hypertensive heart disease 3. Morbid obesity  Recommendations:  Patient were cardiac event monitor and followup afterwards. I reduced her amlodipine to 5 mg daily and increased her metoprolol to 50 mg twice a day. ____________________________ TODAYS ORDERS  1. [place]: Today  2. Return Visit: 1 month                       ____________________________ Cardiology Physician:  Darden PalmerW. Spencer Tilley, Jr. MD [facility]

## 2014-03-27 ENCOUNTER — Ambulatory Visit (INDEPENDENT_AMBULATORY_CARE_PROVIDER_SITE_OTHER): Payer: BC Managed Care – PPO | Admitting: Medical

## 2014-03-27 ENCOUNTER — Encounter: Payer: Self-pay | Admitting: Medical

## 2014-03-27 VITALS — BP 120/70 | HR 80 | Temp 99.6°F | Resp 16 | Wt 256.0 lb

## 2014-03-27 DIAGNOSIS — J209 Acute bronchitis, unspecified: Secondary | ICD-10-CM

## 2014-03-27 DIAGNOSIS — J029 Acute pharyngitis, unspecified: Secondary | ICD-10-CM

## 2014-03-27 LAB — POCT RAPID STREP A (OFFICE): RAPID STREP A SCREEN: NEGATIVE

## 2014-03-27 MED ORDER — HYDROCODONE-HOMATROPINE 5-1.5 MG/5ML PO SYRP: 5.0000 mL | ORAL_SOLUTION | Freq: Three times a day (TID) | ORAL | Status: DC | PRN

## 2014-03-27 MED ORDER — ALBUTEROL SULFATE HFA 108 (90 BASE) MCG/ACT IN AERS: 2.0000 | INHALATION_SPRAY | Freq: Four times a day (QID) | RESPIRATORY_TRACT | Status: DC | PRN

## 2014-03-27 MED ORDER — AZITHROMYCIN 250 MG PO TABS: ORAL_TABLET | ORAL | Status: DC

## 2014-03-27 NOTE — Patient Instructions (Signed)
Bronchitis and sore throat  Rest  Drink plenty of water daily  Begin Zpak antibiotic x 5 days  You may use albuterol inhaler for shorntess of breath, cough, wheezing, 2 puffs every 4-6 hours as needed  You may use Hycodan cough syrup for worse cough if needed  You may use OTC Mucinex DM or Coricidin HBP for cough/congestion

## 2014-03-27 NOTE — Progress Notes (Signed)
  Subjective:  Courtney Bates is a 48 y.o. female who presents for cough, sore throat.  Symptoms include 2 wk of cough, worsening with production, chest congestion, tightness in chest, horrible sore throat last 2 days, headache from coughing so much, fever 101.9 last night.  Denies ear pain, sinus pressure, rash, NVD.  Treatment to date: Tylenol.  No sick contacts.   She does not smoke.   She does not have a history of bronchitis.   No other aggravating or relieving factors.  No other c/o.  The following portions of the patient's history were reviewed and updated as appropriate: allergies, current medications, past family history, past medical history, past social history, past surgical history and problem list.  ROS as in subjective  Past Medical History  Diagnosis Date  . Family history of thyroid disease   . Allergy   . GERD (gastroesophageal reflux disease)   . Hypertensive heart disease     ECHO 01/30/14 with concentric LVH  EF 60% and   . Morbid obesity      Objective: BP 120/70  Pulse 80  Temp(Src) 99.6 F (37.6 C) (Oral)  Resp 16  Wt 256 lb (116.121 kg)   General appearance: Alert, WD/WN, no distress, ill appearing                             Skin: warm, no rash, no diaphoresis                           Head: no sinus tenderness                            Eyes: conjunctiva normal, corneas clear, PERRLA                            Ears: pearly TMs, external ear canals normal                          Nose: septum midline, turbinates swollen, with erythema and clear discharge             Mouth/throat: MMM, tongue normal, mild pharyngeal erythema                           Neck: supple, no adenopathy, no thyromegaly, nontender                          Heart: RRR, normal S1, S2, no murmurs                         Lungs: +bronchial breath sounds, +scattered rhonchi, no wheezes, no rales                Extremities: no edema, nontender     Assessment: Encounter Diagnoses   Name Primary?  . Acute bronchitis Yes  . Sore throat       Plan:  Medication orders today include: Zpak, Hycodan syrup prn, Albuterol inhaler prn.  Discussed diagnosis and treatment of bronchitis.  Suggested symptomatic OTC remedies for cough and congestion.  Tylenol or Ibuprofen OTC for fever and malaise.  Call/return in 2-3 days if symptoms are worse or not improving.

## 2014-04-23 ENCOUNTER — Encounter: Payer: Self-pay | Admitting: Cardiology

## 2014-04-23 NOTE — Progress Notes (Unsigned)
Patient ID: Eveline KetoMina G Timoney, female   DOB: 10/04/1966, 48 y.o.   MRN: [id number]   Vanice SarahWhitmore, Bo    Date of visit:  04/23/2014 DOB:  10/04/1966    Age:  47 yrs. Medical record number:  [id number]     Account number:  [id number] Primary Care Provider: Yvetta CoderYSINGER, D. SHANE ____________________________ CURRENT DIAGNOSES  1. Hypertensive Heart Disease-Benign without CHF  2. Palpitations  3. GERD  4. Obesity, morbid (BMI>40)  5. Hypertensive Heart Disease Nos ____________________________ ALLERGIES  Penicillins, Intolerance-unknown ____________________________ MEDICATIONS  1. hydrochlorothiazide 25 mg tablet, 1 p.o. daily  2. omeprazole 40 mg capsule,delayed release, 1 p.o. daily  3. lisinopril 10 mg tablet, 1 p.o. daily  4. furosemide 40 mg tablet, 1 p.o. daily  5. amlodipine 5 mg tablet, Daily  6. metoprolol tartrate 50 mg tablet, BID ____________________________ CHIEF COMPLAINTS  Followup of Hypertensive Heart Disease-Benign without CHF  Followup of Palpitations ____________________________ HISTORY OF PRESENT ILLNESS  Patient seen for cardiac followup. She has lost an additional 4 pounds since she was here and remains normotensive. She does complain of occasional headaches. She denies other cardiac symptoms at this time. She has worn a cardiac event monitor that showed an isolated episode of 9 beats of atrial tachycardia on April 19 but for the most part had no significant arrhythmias. ____________________________ PAST HISTORY  Past Medical Illnesses:  hypertension, morbid obesity, GERD;  Cardiovascular Illnesses:  no previous history of cardiac disease.;  Surgical Procedures:  no prior surgical procedures;  Cardiology Procedures-Invasive:  no history of prior cardiac procedures;  Cardiology Procedures-Noninvasive:  echocardiogram February 2015, event monitor April 2015;  LVEF of 60% documented via echocardiogram on 01/30/2014,   ____________________________ CARDIO-PULMONARY TEST DATES EKG  Date:  01/17/2014;  Holter/Event Monitor Date: 03/19/2014;  Echocardiography Date: 01/30/2014;   ____________________________ FAMILY HISTORY Brother -- Brother alive and well Father -- Father dead, Cancer Mother -- Mother alive with problem, Hypertension Sister -- Sister alive and well Sister -- Sister alive and well ____________________________ SOCIAL HISTORY Alcohol Use:  no alcohol use;  Smoking:  never smoked;  Diet:  regular diet;  Lifestyle:  1 son, 1 daughter and single;  Exercise:  no regular exercise;  Occupation:  sewing;  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 Weight:  249.00 lbs. Height:  66"BMI: 40  Constitutional:  pleasant African American female in no acute distress, severely obese Head:  normocephalic, normal hair pattern, no masses or tenderness Chest:  normal symmetry, clear to auscultation. Cardiac:  regular rhythm, normal S1 and S2, No S3 or S4, no murmurs, gallops or rubs detected. Extremities & Back:  no deformities, clubbing, cyanosis, erythema or edema observed. Normal muscle strength and tone. Neurological:  no gross motor or sensory deficits noted, affect appropriate, oriented x3. ____________________________ IMPRESSIONS/PLAN  1. Hypertensive heart disease with LVH under good control now 2. Morbid obesity but with good weight loss since here  Recommendations:  Obtain a metabolic panel today to assess potassium. She may be able to cut back one of her diuretics if her blood pressure remains low like this. I have asked her to followup with her primary care provider and I will see her again in one year and consider a repeat echo to evaluate her LV wall thickness. If she remains significantly normotensive I would recommend discontinuing either the furosemide or  the HCTZ and seeing how her  blood pressure did. ____________________________ TODAYS ORDERS  1. Basic Metabolic Panel: Today  2. Return Visit: 1 year                       ____________________________ Cardiology Physician:  Darden PalmerW. Spencer Tilley, Jr. MD [facility]

## 2014-05-02 ENCOUNTER — Other Ambulatory Visit: Payer: Self-pay | Admitting: Medical

## 2014-05-15 ENCOUNTER — Encounter: Payer: Self-pay | Admitting: Medical

## 2014-06-03 ENCOUNTER — Ambulatory Visit: Payer: Self-pay | Admitting: Medical

## 2014-06-28 ENCOUNTER — Ambulatory Visit: Payer: BC Managed Care – PPO | Admitting: Medical

## 2014-07-01 ENCOUNTER — Encounter: Payer: Self-pay | Admitting: Family Medicine

## 2014-07-01 ENCOUNTER — Ambulatory Visit (INDEPENDENT_AMBULATORY_CARE_PROVIDER_SITE_OTHER): Payer: BC Managed Care – PPO | Admitting: Family Medicine

## 2014-07-01 VITALS — BP 146/100 | HR 80 | Temp 98.1°F | Ht 67.0 in | Wt 249.0 lb

## 2014-07-01 DIAGNOSIS — R05 Cough: Secondary | ICD-10-CM

## 2014-07-01 DIAGNOSIS — K219 Gastro-esophageal reflux disease without esophagitis: Secondary | ICD-10-CM

## 2014-07-01 DIAGNOSIS — Z79899 Other long term (current) drug therapy: Secondary | ICD-10-CM

## 2014-07-01 DIAGNOSIS — I119 Hypertensive heart disease without heart failure: Secondary | ICD-10-CM

## 2014-07-01 DIAGNOSIS — R059 Cough, unspecified: Secondary | ICD-10-CM

## 2014-07-01 LAB — COMPREHENSIVE METABOLIC PANEL
ALBUMIN: 3.8 g/dL (ref 3.5–5.2)
ALK PHOS: 73 U/L (ref 39–117)
AST: 10 U/L (ref 0–37)
BILIRUBIN TOTAL: 0.4 mg/dL (ref 0.2–1.2)
BUN: 15 mg/dL (ref 6–23)
CO2: 28 meq/L (ref 19–32)
Calcium: 9.3 mg/dL (ref 8.4–10.5)
Chloride: 102 mEq/L (ref 96–112)
Creat: 1.1 mg/dL (ref 0.50–1.10)
Glucose, Bld: 87 mg/dL (ref 70–99)
Potassium: 4 mEq/L (ref 3.5–5.3)
SODIUM: 138 meq/L (ref 135–145)
Total Protein: 6.5 g/dL (ref 6.0–8.3)

## 2014-07-01 LAB — CBC WITH DIFFERENTIAL/PLATELET
BASOS ABS: 0.1 10*3/uL (ref 0.0–0.1)
BASOS PCT: 1 % (ref 0–1)
EOS PCT: 2 % (ref 0–5)
Eosinophils Absolute: 0.1 10*3/uL (ref 0.0–0.7)
HCT: 37.4 % (ref 36.0–46.0)
Hemoglobin: 12.8 g/dL (ref 12.0–15.0)
Lymphocytes Relative: 27 % (ref 12–46)
Lymphs Abs: 1.9 10*3/uL (ref 0.7–4.0)
MCH: 28.3 pg (ref 26.0–34.0)
MCHC: 34.2 g/dL (ref 30.0–36.0)
MCV: 82.6 fL (ref 78.0–100.0)
Monocytes Absolute: 0.6 10*3/uL (ref 0.1–1.0)
Monocytes Relative: 8 % (ref 3–12)
Neutro Abs: 4.4 10*3/uL (ref 1.7–7.7)
Neutrophils Relative %: 62 % (ref 43–77)
PLATELETS: 384 10*3/uL (ref 150–400)
RBC: 4.53 MIL/uL (ref 3.87–5.11)
RDW: 13 % (ref 11.5–15.5)
WBC: 7.1 10*3/uL (ref 4.0–10.5)

## 2014-07-01 LAB — LIPID PANEL
Cholesterol: 157 mg/dL (ref 0–200)
HDL: 33 mg/dL — ABNORMAL LOW (ref >39)
LDL Cholesterol: 96 mg/dL (ref 0–99)
Total CHOL/HDL Ratio: 4.8 Ratio
Triglycerides: 139 mg/dL (ref <150)
VLDL: 28 mg/dL (ref 0–40)

## 2014-07-01 MED ORDER — AZITHROMYCIN 250 MG PO TABS: ORAL_TABLET | ORAL | Status: DC

## 2014-07-01 MED ORDER — LOSARTAN POTASSIUM 25 MG PO TABS: 25.0000 mg | ORAL_TABLET | Freq: Every day | ORAL | Status: DC

## 2014-07-01 MED ORDER — BENZONATATE 200 MG PO CAPS: 200.0000 mg | ORAL_CAPSULE | Freq: Three times a day (TID) | ORAL | Status: DC | PRN

## 2014-07-01 NOTE — Progress Notes (Signed)
Chief Complaint  Patient presents with  . Cough    dry cough since seen 03/27/14. She cannot sleep. She is also having hot flashes x 2 weeks.    She was last seen in April, and treated for acute bronchitis.  The fevers and productive cough resolved, but the dry cough (which remains intense) has remained the same.  Still having very frequent dry cough, which keeps her up at night, causing sore throat.  For the last week and a half she has been having "hot flashes".  She checked her temp a week ago and it was 100.  Denies chills.  She complains of "her nerves", where she has a lot of stress--she feels like her "skin is crawling", but denies skin being itchy.  She was started on lisinopril back in January, per pt. She states that the cough started BEFORE being started on any blood pressure medications. She thinks the cough is worse since being on the BP meds.  She is compliant with taking the omeprazole daily, no change in cough since starting that (if anything, is worse).  Denies sniffles, sneeze, postnasal drainage, sinus pressure.  She complains of constant headache--across her forehead, and the base of her skull, posteriorly, off and on x 5 days.  Taking Tylenol, but not helping.  She used albuterol once last week (when it was very hot)--doesn't seem to make a difference with the cough.  Denies dyspnea on exertion.  BP's are checked at [facility], and are "good"--she didn't bring list, thinks maybe 125/80 (can't recall numbers, just that they are "normal")  Past Medical History  Diagnosis Date  . Family history of thyroid disease   . Allergy   . GERD (gastroesophageal reflux disease)   . Hypertensive heart disease     ECHO 01/30/14 with concentric LVH  EF 60% and   . Morbid obesity    History reviewed. No pertinent past surgical history.  History   Social History  . Marital Status: Single    Spouse Name: N/A    Number of Children: N/A  . Years of Education: N/A   Occupational History  .  Not on file.   Social History Main Topics  . Smoking status: Never Smoker   . Smokeless tobacco: Never Used  . Alcohol Use: Yes     Comment: occasionally  . Drug Use: No  . Sexual Activity: Not on file   Other Topics Concern  . 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 acetaminophen (TYLENOL) 500 MG tablet Take 1,000 mg by mouth every 6 (six) hours as needed. 07/01/2014: Taking prn headache, cramps  . amLODipine (NORVASC) 5 MG tablet Take 1 tablet (5 mg total) by mouth 2 (two) times daily.   . furosemide (LASIX) 40 MG tablet Take 1 tablet (40 mg total) by mouth daily.   . hydrochlorothiazide (HYDRODIURIL) 25 MG tablet TAKE 1 TABLET (25 MG TOTAL) BY MOUTH DAILY.   Marland Kitchen. lisinopril (PRINIVIL,ZESTRIL) 10 MG tablet Take 1 tablet (10 mg total) by mouth daily.   . metoprolol tartrate (LOPRESSOR) 25 MG tablet Take 1 tablet (25 mg total) by mouth 2 (two) times daily.   Marland Kitchen. omeprazole (PRILOSEC) 40 MG capsule 1 TABLET PO 30 MIN PRIOR TO BREAKFAST   . albuterol (PROVENTIL HFA;VENTOLIN HFA) 108 (90 BASE) MCG/ACT inhaler Inhale 2 puffs into the lungs every 6 (  six) hours as needed for wheezing or shortness of breath. 07/01/2014: Uses prn, last used one day last week  . [DISCONTINUED] azithromycin (ZITHROMAX) 250 MG tablet 2 tablets day 1, then 1 tablet days 2-4   . [DISCONTINUED] HYDROcodone-homatropine (HYCODAN) 5-1.5 MG/5ML syrup Take 5 mLs by mouth every 8 (eight) hours as needed for cough.   . [DISCONTINUED] metoprolol tartrate (LOPRESSOR) 25 MG tablet Take 1 tablet (25 mg total) by mouth 2 (two) times daily.    Allergies  Allergen Reactions  . Penicillins    ROS:  Headache per HPI, no dizziness, other neuro symptoms.  No URI symptoms, just dry cough.  No dyspnea.  No nausea, vomiting, heartburn,bowel changes. Left foot swelling off/on x 2 months Menses are regular--on  it now, +cramping. Denies urinary complaints.  Has some knee pain L>R. +cramps in her legs, that are severe at night, and into her toes.  PHYSICAL EXAM: BP 180/108  Pulse 80  Temp(Src) 98.1 F (36.7 C) (Tympanic)  Ht 5\' 7"  (1.702 m)  Wt 249 lb (112.946 kg)  BMI 38.99 kg/m2  LMP 07/01/2014 146/100 on repeat by MD Well developed, pleasant female in no distress. She had spell of dry cough while nurse was checking her in.  Frequent throat clearing and only rare cough during my visit. HEENT:  PERRL, EOMI, conjunctiva clear.  Nasal mucosa is mildly edematous, no purulence.  Sinuses nontender.  OP clear TM's and EAC--normal on the left, occluded by cerumen on the right. Temporalis muscles and temporal arteries are nontender Neck: no lymphadenopathy or mass Heart: regular rate and rhythm Lungs:  Slightly coarse on the right.  No wheezes, rales, ronchi.  No cough with forced expiration; fair air movement throughout Abdomen: obese, soft, nontender Extremities: no edema  ASSESSMENT/PLAN:  Hypertensive heart disease without heart failure - Plan: Lipid panel, losartan (COZAAR) 25 MG tablet  Gastroesophageal reflux disease, esophagitis presence not specified  Cough - Plan: CBC with Differential, azithromycin (ZITHROMAX) 250 MG tablet, benzonatate (TESSALON) 200 MG capsule  Encounter for long-term (current) use of other medications - Plan: Comprehensive metabolic panel  Unusual regimen with both lasix and HCTZ.  She is having muscle cramps, and per last lab results, is past due for recheck.  Check chem today.  Reviewed high potassium foods.   Cough--cannot r/o bronchitis given recent exacerbation, ?lowgrade fevers, and coarseness focally on right on exam.  There appears to be chronic dry cough--?related to ACEI, possibly component of reflux (although unchanged despite PPI). Check CBC.  Treat for bronchitis with z-pak, tessalon prn Change ACEI to ARB Pt states not sexually active, no risk for  pregnancy--discussed contraindication, and to use condoms if/when sexually active  Check lipids--none in chart.  She had a few sips of coffee (not black) which might affect it--so if TG/glu elevated, plan to repeat when fasting  F/u in 2 weeks with Vincenza Hews

## 2014-07-01 NOTE — Patient Instructions (Signed)
Take the z-pak. Use the tessalon as needed for cough.  STOP the lisinopril, and take the losartan in its place. Continue to check your BP and bring your list to your follow-up appointment. Try and get potassium rich foods in your diet (bananas, orange juice)  See Vincenza HewsShane in 2 weeks for follow-up.  We will be in touch with your results in 1-2 days.

## 2014-07-15 ENCOUNTER — Encounter: Payer: Self-pay | Admitting: Medical

## 2014-07-15 ENCOUNTER — Ambulatory Visit (INDEPENDENT_AMBULATORY_CARE_PROVIDER_SITE_OTHER): Payer: BC Managed Care – PPO | Admitting: Medical

## 2014-07-15 VITALS — BP 132/84 | HR 68 | Temp 98.6°F | Resp 16 | Wt 252.0 lb

## 2014-07-15 DIAGNOSIS — R059 Cough, unspecified: Secondary | ICD-10-CM

## 2014-07-15 DIAGNOSIS — I10 Essential (primary) hypertension: Secondary | ICD-10-CM

## 2014-07-15 DIAGNOSIS — E786 Lipoprotein deficiency: Secondary | ICD-10-CM

## 2014-07-15 DIAGNOSIS — R05 Cough: Secondary | ICD-10-CM

## 2014-07-15 MED ORDER — OMEPRAZOLE 40 MG PO CPDR: 40.0000 mg | DELAYED_RELEASE_CAPSULE | Freq: Two times a day (BID) | ORAL | Status: DC

## 2014-07-15 MED ORDER — HYDROCODONE-HOMATROPINE 5-1.5 MG/5ML PO SYRP: 5.0000 mL | ORAL_SOLUTION | Freq: Three times a day (TID) | ORAL | Status: DC | PRN

## 2014-07-15 NOTE — Progress Notes (Signed)
Subjective: Here for followup  At her recent visit had labs, HDL was found to be low, blood pressure medications were modified given cough, lisinopril was stopped, losartan was added. There is discrepancy in the chart regarding the metoprolol dose and she is not sure which that she is taking.  She is actually taking Lasix and HCTZ both.  Her only complaint today is that the cough lingers, interfering with sleep. she is not currently taken allergy pill but does note congestion and sneezing regular. She eats a variety of foods, does take omeprazole once daily.  Otherwise no complaints  Review of systems as in subjective  Objective: Filed Vitals:   07/15/14 1441  BP: 132/84  Pulse: 68  Temp: 98.6 F (37 C)  Resp: 16    General appearence: alert, no distress, WD/WN Neck: supple, no lymphadenopathy, no thyromegaly, no masses, no bruits Heart: RRR, normal S1, S2, no murmurs Lungs: CTA bilaterally, no wheezes, rhonchi, or rales Abdomen: +bs, soft, non tender, non distended, no masses, no hepatomegaly, no splenomegaly Pulses: 2+ symmetric, upper and lower extremities, normal cap refill Ext: 1+ bilat lower ext edema, nonpitting  Assessment: Encounter Diagnoses  Name Primary?  . Essential hypertension, benign Yes  . Low HDL (under 40)   . Cough    Plan: We reviewed her recent labs, discussed the significance of low HDL. Discussed the need for weight loss, healthy diet, routine exercise.  She will call back to update me on which milligram dose of Metoprolol she is taking. She is compliant with the rest of the medications.  She has only been off the lisinopril less than a week. We discussed her cough concerns.  Specific recommendations today include:  It is still not clear what is causing your cough - you have only been off the lisinopril about a week, the cough could be related to allergies or acid reflux  You may use the Hycodan syrup at bedtime for cough  Increase your omeprazole to  40 mg twice daily to help with acid reflux  Begin Zyrtec allergy medication over-the-counter at bedtime to help with any allergy trigger  Stop the furosemide/Lasix fluid pill by taking a half tablet daily for the next 3 days and then stop this completely  Please call back to verify whether you're taking metoprolol 25 mg versus 50 mg twice daily?  Continue all other medications as usual  Once I hear back from urology the additional recommendations which may include increasing the losartan medication dose  Don't eat or drink within 2 hours at bedtime  Avoid acidic foods, spicy food, tomato-based foods, citrus foods, peppers for now which can all lead to acid reflux  Try and lose weight through healthy diet and exercise  Get healthy fats in the diet such as fish, fish old tablets, avocado, almonds  Avoid red meat and processed foods and high sugar foods  Recheck in one month, sooner when necessary

## 2014-07-15 NOTE — Patient Instructions (Signed)
  Thank you for giving me the opportunity to serve you today.    Your diagnosis today includes: Encounter Diagnoses  Name Primary?  . Essential hypertension, benign Yes  . Low HDL (under 40)   . Cough      Specific recommendations today include:  It is still not clear what is causing your cough - you have only been off the lisinopril about a week, the cough could be related to allergies or acid reflux  You may use the Hycodan syrup at bedtime for cough  Increase your omeprazole to 40 mg twice daily to help with acid reflux  Begin Zyrtec allergy medication over-the-counter at bedtime to help with any allergy trigger  Stop the furosemide/Lasix fluid pill by taking a half tablet daily for the next 3 days and then stop this completely  Please call back to verify whether you're taking metoprolol 25 mg versus 50 mg twice daily?  Continue all other medications as usual  Once I hear back from urology the additional recommendations which may include increasing the losartan medication dose  Don't eat or drink within 2 hours at bedtime  Avoid acidic foods, spicy food, tomato-based foods, citrus foods, peppers for now which can all lead to acid reflux  Try and lose weight through healthy diet and exercise  Get healthy fats in the diet such as fish, fish old tablets, avocado, almonds  Avoid red meat and processed foods and high sugar foods  Let us plan to recheck in  1 month , however of cough worsening in the meantime then let me know

## 2014-07-17 ENCOUNTER — Other Ambulatory Visit: Payer: Self-pay | Admitting: Medical

## 2014-07-17 ENCOUNTER — Telehealth: Payer: Self-pay | Admitting: Medical

## 2014-07-17 DIAGNOSIS — R05 Cough: Secondary | ICD-10-CM

## 2014-07-17 DIAGNOSIS — R059 Cough, unspecified: Secondary | ICD-10-CM

## 2014-07-17 MED ORDER — PROMETHAZINE-DM 6.25-15 MG/5ML PO SYRP: 5.0000 mL | ORAL_SOLUTION | Freq: Four times a day (QID) | ORAL | Status: DC | PRN

## 2014-07-17 MED ORDER — METOPROLOL TARTRATE 50 MG PO TABS: 50.0000 mg | ORAL_TABLET | Freq: Two times a day (BID) | ORAL | Status: DC

## 2014-07-17 NOTE — Telephone Encounter (Signed)
Pt stopped by with a couple of issues. Pt cant take to cough meds with codine, it is making her itch. She would like something else sent in. Also pt is agreeable to another chest xray. I told her chandra would call with info on that. She also wanted you to know that she stakes metoprolol 50 mg.  She states there was question about that. Pt uses [facility] and can be reached at [phone]

## 2014-07-17 NOTE — Telephone Encounter (Signed)
Called out a different cough syrup, promethazine DM, have her stop the Hycodan syrup.  [facility] [address] 8am - 4pm tomorrow for Chest xray.  Please abstract her update her allergies to show Hycodan

## 2014-07-18 NOTE — Telephone Encounter (Signed)
LM to [facility] 

## 2014-07-18 NOTE — Telephone Encounter (Signed)
PT INFORMED AND VERBALIZED UNDERSTANDING 

## 2014-07-19 ENCOUNTER — Ambulatory Visit
Admission: RE | Admit: 2014-07-19 | Discharge: 2014-07-19 | Disposition: A | Discharge status: Home / Self Care | Payer: BC Managed Care – PPO | Source: Ambulatory Visit | Attending: Medical | Admitting: Medical

## 2014-07-19 DIAGNOSIS — R059 Cough, unspecified: Secondary | ICD-10-CM

## 2014-07-19 DIAGNOSIS — R05 Cough: Secondary | ICD-10-CM

## 2014-08-15 ENCOUNTER — Ambulatory Visit: Payer: BC Managed Care – PPO | Admitting: Medical

## 2014-08-23 ENCOUNTER — Encounter: Payer: Self-pay | Admitting: Medical

## 2014-08-23 ENCOUNTER — Telehealth: Payer: Self-pay | Admitting: Medical

## 2014-08-23 NOTE — Telephone Encounter (Signed)
No show letter sent/call back list checked °

## 2014-08-24 ENCOUNTER — Other Ambulatory Visit: Payer: Self-pay | Admitting: Family Medicine

## 2014-08-26 NOTE — Telephone Encounter (Signed)
SHANE IS THIS OKAY

## 2014-10-28 ENCOUNTER — Encounter: Payer: BC Managed Care – PPO | Admitting: Medical

## 2014-11-13 ENCOUNTER — Other Ambulatory Visit: Payer: Self-pay | Admitting: Medical

## 2014-11-13 NOTE — Telephone Encounter (Signed)
OK to RF? No show x2

## 2014-11-18 ENCOUNTER — Ambulatory Visit: Payer: BC Managed Care – PPO | Admitting: Medical

## 2014-12-02 ENCOUNTER — Encounter: Payer: Self-pay | Admitting: Family Medicine

## 2015-01-18 ENCOUNTER — Other Ambulatory Visit: Payer: Self-pay | Admitting: Medical

## 2015-08-07 ENCOUNTER — Other Ambulatory Visit (INDEPENDENT_AMBULATORY_CARE_PROVIDER_SITE_OTHER): Payer: BLUE CROSS/BLUE SHIELD

## 2015-08-07 ENCOUNTER — Encounter: Payer: Self-pay | Admitting: Family

## 2015-08-07 ENCOUNTER — Ambulatory Visit (INDEPENDENT_AMBULATORY_CARE_PROVIDER_SITE_OTHER): Payer: BLUE CROSS/BLUE SHIELD | Admitting: Family

## 2015-08-07 VITALS — BP 162/98 | HR 68 | Temp 98.4°F | Resp 18 | Ht 67.0 in | Wt 259.0 lb

## 2015-08-07 DIAGNOSIS — Z1231 Encounter for screening mammogram for malignant neoplasm of breast: Secondary | ICD-10-CM

## 2015-08-07 DIAGNOSIS — K219 Gastro-esophageal reflux disease without esophagitis: Secondary | ICD-10-CM | POA: Diagnosis not present

## 2015-08-07 DIAGNOSIS — I119 Hypertensive heart disease without heart failure: Secondary | ICD-10-CM | POA: Diagnosis not present

## 2015-08-07 DIAGNOSIS — Z7189 Other specified counseling: Secondary | ICD-10-CM

## 2015-08-07 DIAGNOSIS — Z7689 Persons encountering health services in other specified circumstances: Secondary | ICD-10-CM

## 2015-08-07 DIAGNOSIS — R6889 Other general symptoms and signs: Secondary | ICD-10-CM

## 2015-08-07 LAB — BASIC METABOLIC PANEL
BUN: 11 mg/dL (ref 6–23)
CALCIUM: 9.3 mg/dL (ref 8.4–10.5)
CO2: 29 mEq/L (ref 19–32)
CREATININE: 0.98 mg/dL (ref 0.40–1.20)
Chloride: 105 mEq/L (ref 96–112)
GFR: 77.68 mL/min (ref >60.00)
Glucose, Bld: 99 mg/dL (ref 70–99)
Potassium: 3.6 mEq/L (ref 3.5–5.1)
Sodium: 139 mEq/L (ref 135–145)

## 2015-08-07 LAB — CBC
HCT: 39.7 % (ref 36.0–46.0)
Hemoglobin: 13.3 g/dL (ref 12.0–15.0)
MCHC: 33.6 g/dL (ref 30.0–36.0)
MCV: 80.5 fl (ref 78.0–100.0)
Platelets: 321 10*3/uL (ref 150.0–400.0)
RBC: 4.93 Mil/uL (ref 3.87–5.11)
RDW: 13.3 % (ref 11.5–15.5)
WBC: 8.2 10*3/uL (ref 4.0–10.5)

## 2015-08-07 LAB — IBC PANEL
Iron: 48 ug/dL (ref 42–145)
Saturation Ratios: 12.2 % — ABNORMAL LOW (ref 20.0–50.0)
Transferrin: 282 mg/dL (ref 212.0–360.0)

## 2015-08-07 LAB — FERRITIN: Ferritin: 15.7 ng/mL (ref 10.0–291.0)

## 2015-08-07 LAB — TSH: TSH: 0.82 u[IU]/mL (ref 0.35–4.50)

## 2015-08-07 MED ORDER — LOSARTAN POTASSIUM-HCTZ 100-12.5 MG PO TABS: 1.0000 | ORAL_TABLET | Freq: Every day | ORAL | Status: DC

## 2015-08-07 MED ORDER — PANTOPRAZOLE SODIUM 40 MG PO TBEC: 40.0000 mg | DELAYED_RELEASE_TABLET | Freq: Every day | ORAL | Status: DC

## 2015-08-07 NOTE — Assessment & Plan Note (Signed)
Previously maintained on omeprazole which she discontinued taking independently. Start Protonix. Follow-up if symptoms worsen or do not improve with the medication.

## 2015-08-07 NOTE — Patient Instructions (Addendum)
Thank you for choosing [facility].  Summary/Instructions:  Your prescription(s) have been submitted to your pharmacy or been printed and provided for you. Please take as directed and contact our office if you believe you are having problem(s) with the medication(s) or have any questions.  Please stop by the lab on the basement level of the building for your blood work. Your results will be released to MyChart (or called to you) after review, usually within 72 hours after test completion. If any changes need to be made, you will be notified at that same time.  Referrals have been made during this visit. You should expect to hear back from our schedulers in about 7-10 days in regards to establishing an appointment with the specialists we discussed.   If your symptoms worsen or fail to improve, please contact our office for further instruction, or in case of emergency go directly to the emergency room at the closest medical facility.   DASH Eating Plan DASH stands for "Dietary Approaches to Stop Hypertension." The DASH eating plan is a healthy eating plan that has been shown to reduce high blood pressure (hypertension). Additional health benefits may include reducing the risk of type 2 diabetes mellitus, heart disease, and stroke. The DASH eating plan may also help with weight loss. WHAT DO I NEED TO KNOW ABOUT THE DASH EATING PLAN? For the DASH eating plan, you will follow these general guidelines:  Choose foods with a percent daily value for sodium of less than 5% (as listed on the food label).  Use salt-free seasonings or herbs instead of table salt or sea salt.  Check with your health care provider or pharmacist before using salt substitutes.  Eat lower-sodium products, often labeled as "lower sodium" or "no salt added."  Eat fresh foods.  Eat more vegetables, fruits, and low-fat dairy products.  Choose whole grains. Look for the word "whole" as the first word in the ingredient  list.  Choose fish and skinless chicken or turkey more often than red meat. Limit fish, poultry, and meat to 6 oz (170 g) each day.  Limit sweets, desserts, sugars, and sugary drinks.  Choose heart-healthy fats.  Limit cheese to 1 oz (28 g) per day.  Eat more home-cooked food and less restaurant, buffet, and fast food.  Limit fried foods.  Cook foods using methods other than frying.  Limit canned vegetables. If you do use them, rinse them well to decrease the sodium.  When eating at a restaurant, ask that your food be prepared with less salt, or no salt if possible. WHAT FOODS CAN I EAT? Seek help from a dietitian for individual calorie needs. Grains Whole grain or whole wheat bread. Brown rice. Whole grain or whole wheat pasta. Quinoa, bulgur, and whole grain cereals. Low-sodium cereals. Corn or whole wheat flour tortillas. Whole grain cornbread. Whole grain crackers. Low-sodium crackers. Vegetables Fresh or frozen vegetables (raw, steamed, roasted, or grilled). Low-sodium or reduced-sodium tomato and vegetable juices. Low-sodium or reduced-sodium tomato sauce and paste. Low-sodium or reduced-sodium canned vegetables.  Fruits All fresh, canned (in natural juice), or frozen fruits. Meat and Other Protein Products Ground beef (85% or leaner), grass-fed beef, or beef trimmed of fat. Skinless chicken or turkey. Ground chicken or turkey. Pork trimmed of fat. All fish and seafood. Eggs. Dried beans, peas, or lentils. Unsalted nuts and seeds. Unsalted canned beans. Dairy Low-fat dairy products, such as skim or 1% milk, 2% or reduced-fat cheeses, low-fat ricotta or cottage cheese, or plain low-fat   yogurt. Low-sodium or reduced-sodium cheeses. Fats and Oils Tub margarines without trans fats. Light or reduced-fat mayonnaise and salad dressings (reduced sodium). Avocado. Safflower, olive, or canola oils. Natural peanut or almond butter. Other Unsalted popcorn and pretzels. The items listed  above may not be a complete list of recommended foods or beverages. Contact your dietitian for more options. WHAT FOODS ARE NOT RECOMMENDED? Grains White bread. White pasta. White rice. Refined cornbread. Bagels and croissants. Crackers that contain trans fat. Vegetables Creamed or fried vegetables. Vegetables in a cheese sauce. Regular canned vegetables. Regular canned tomato sauce and paste. Regular tomato and vegetable juices. Fruits Dried fruits. Canned fruit in light or heavy syrup. Fruit juice. Meat and Other Protein Products Fatty cuts of meat. Ribs, chicken wings, bacon, sausage, bologna, salami, chitterlings, fatback, hot dogs, bratwurst, and packaged luncheon meats. Salted nuts and seeds. Canned beans with salt. Dairy Whole or 2% milk, cream, half-and-half, and cream cheese. Whole-fat or sweetened yogurt. Full-fat cheeses or blue cheese. Nondairy creamers and whipped toppings. Processed cheese, cheese spreads, or cheese curds. Condiments Onion and garlic salt, seasoned salt, table salt, and sea salt. Canned and packaged gravies. Worcestershire sauce. Tartar sauce. Barbecue sauce. Teriyaki sauce. Soy sauce, including reduced sodium. Steak sauce. Fish sauce. Oyster sauce. Cocktail sauce. Horseradish. Ketchup and mustard. Meat flavorings and tenderizers. Bouillon cubes. Hot sauce. Tabasco sauce. Marinades. Taco seasonings. Relishes. Fats and Oils Butter, stick margarine, lard, shortening, ghee, and bacon fat. Coconut, palm kernel, or palm oils. Regular salad dressings. Other Pickles and olives. Salted popcorn and pretzels. The items listed above may not be a complete list of foods and beverages to avoid. Contact your dietitian for more information. WHERE CAN I FIND MORE INFORMATION? National Heart, Lung, and Blood Institute: www.nhlbi.nih.gov/health/health-topics/topics/dash/ Document Released: 11/18/2011 Document Revised: 04/15/2014 Document Reviewed: 10/03/2013 ExitCare Patient  Information 2015 [employer]. This information is not intended to replace advice given to you by your health care provider. Make sure you discuss any questions you have with your health care provider.   

## 2015-08-07 NOTE — Progress Notes (Signed)
Pre visit review using our clinic review tool, if applicable. No additional management support is needed unless otherwise documented below in the visit note. 

## 2015-08-07 NOTE — Progress Notes (Signed)
Subjective:    Patient ID: Courtney Bates, female    DOB: 1966-06-26, 49 y.o.   MRN: [id number]  Chief Complaint  Patient presents with  . Establish Care    wants to get put back on BP medication    HPI:  Courtney Bates is a 49 y.o. female with a PMH of hypertension, GERD and morbid obesity who presents today for an office visit to establish care.   1.) Hypertension - Previously diagnosed with high blood pressure. Tries to avoid fried foods and salts, but that has not helped very much. Checks her pressure about 2x per week and has been slightly elevated. Average home reading around 147/101. Does have a lot of family stress. Previous modifying medications including hydrochlorothiazide.   BP Readings from Last 3 Encounters:  08/07/15 162/98  07/15/14 132/84  07/01/14 146/100    2.) Feelings of cold intolerance - Associated of cold intolerance has been going on for a couple of months. Describes that she has to wear a sweater at times while being indoors. Does have some family history of thyroid disease.  Lab Results  Component Value Date   TSH 0.82 08/07/2015   3.) GERD - previously maintained on omeprazole. Indicates no longer controlled with omeprazole. Requesting additional medication or change in medication to assist with GERD symptoms.   Allergies  Allergen Reactions  . Penicillins      Outpatient Prescriptions Prior to Visit  Medication Sig Dispense Refill  . acetaminophen (TYLENOL) 500 MG tablet Take 1,000 mg by mouth every 6 (six) hours as needed.    Marland Kitchen albuterol (PROVENTIL HFA;VENTOLIN HFA) 108 (90 BASE) MCG/ACT inhaler Inhale 2 puffs into the lungs every 6 (six) hours as needed for wheezing or shortness of breath. 1 Inhaler 0  . amLODipine (NORVASC) 5 MG tablet Take 1 tablet (5 mg total) by mouth 2 (two) times daily. 60 tablet 12  . azithromycin (ZITHROMAX) 250 MG tablet Take 2 tablets by mouth on first day, then 1 tablet by mouth on days 2 through 5 6 tablet 0    . benzonatate (TESSALON) 200 MG capsule Take 1 capsule (200 mg total) by mouth 3 (three) times daily as needed. 30 capsule 0  . furosemide (LASIX) 40 MG tablet Take 1 tablet (40 mg total) by mouth daily. 30 tablet 12  . hydrochlorothiazide (HYDRODIURIL) 25 MG tablet TAKE 1 TABLET (25 MG TOTAL) BY MOUTH DAILY. 30 tablet 0  . losartan (COZAAR) 25 MG tablet TAKE 1 TABLET (25 MG TOTAL) BY MOUTH DAILY. 30 tablet 3  . metoprolol (LOPRESSOR) 50 MG tablet Take 1 tablet (50 mg total) by mouth 2 (two) times daily. 60 tablet 3  . omeprazole (PRILOSEC) 40 MG capsule TAKE 1 CAPSULE (40 MG TOTAL) BY MOUTH 2 (TWO) TIMES DAILY. 60 capsule 1  . promethazine-dextromethorphan (PROMETHAZINE-DM) 6.25-15 MG/5ML syrup Take 5 mLs by mouth 4 (four) times daily as needed for cough. 60 mL 0   No facility-administered medications prior to visit.     Past Medical History  Diagnosis Date  . Family history of thyroid disease   . Allergy   . GERD (gastroesophageal reflux disease)   . Hypertensive heart disease     ECHO 01/30/14 with concentric LVH  EF 60% and   . Morbid obesity   . Depression   . Frequent headaches   . Hypertension      History reviewed. No pertinent past surgical history.   Family History  Problem Relation Age of Onset  .  Hypertension Mother   . Arthritis Mother   . Other Father     brain tumor  . Heart disease Maternal Grandfather   . Heart disease Paternal Grandmother   . Heart disease Paternal Grandfather      Social History   Social History  . Marital Status: Single    Spouse Name: N/A  . Number of Children: 2  . Years of Education: 14   Occupational History  . Sewer    Social History Main Topics  . Smoking status: Never Smoker   . Smokeless tobacco: Never Used  . Alcohol Use: Yes     Comment: occasionally  . Drug Use: No  . Sexual Activity: Yes    Birth Control/ Protection: None   Other Topics Concern  . 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  Respiratory: Negative for chest tightness.   Cardiovascular: Negative for chest pain, palpitations and leg swelling.  Endocrine: Positive for cold intolerance. Negative for polydipsia, polyphagia and polyuria.      Objective:    BP 162/98 mmHg  Pulse 68  Temp(Src) 98.4 F (36.9 C) (Oral)  Resp 18  Ht 5\' 7"  (1.702 m)  Wt 259 lb (117.482 kg)  BMI 40.56 kg/m2  SpO2 98% Nursing note and vital signs reviewed.  Physical Exam  Constitutional: She is oriented to person, place, and time. She appears well-developed and well-nourished. No distress.  Neck: Neck supple. No thyromegaly present.  Cardiovascular: Normal rate, regular rhythm, normal heart sounds and intact distal pulses.   Pulmonary/Chest: Effort normal and breath sounds normal.  Neurological: She is alert and oriented to person, place, and time.  Skin: Skin is warm and dry.  Psychiatric: She has a normal mood and affect. Her behavior is normal. Judgment and thought content normal.       Assessment & Plan:   Problem List Items Addressed This Visit      Cardiovascular and Mediastinum   Hypertensive heart disease - Primary (Chronic)    Blood pressure elevated above goal 140/90 and not currently maintained on medication. Discussed importance of controlling her hypertension given left ventricular hypertrophy and possibility of further end organ damage. Start losartan-hydrochlorothiazide. Continue to monitor blood pressure at home. Follow-up in one month.      Relevant Medications   losartan-hydrochlorothiazide (HYZAAR) 100-12.5 MG per tablet   Other Relevant Orders   Basic Metabolic Panel (BMET) (Completed)     Digestive   GERD (gastroesophageal reflux disease) (Chronic)    Previously maintained  on omeprazole which she discontinued taking independently. Start Protonix. Follow-up if symptoms worsen or do not improve with the medication.      Relevant Medications   pantoprazole (PROTONIX) 40 MG tablet     Other   Cold intolerance    Symptoms of cold intolerance of unknown origin. Obtain CBC, ferritin, iron panel, and TSH to rule out metabolic causes. Follow-up pending lab work.      Relevant Orders   CBC (Completed)   TSH (Completed)   IBC panel (Completed)   Ferritin (Completed)   Establishing care with new doctor, encounter for   Relevant Orders   Ambulatory referral to Gynecology    Other Visit Diagnoses  Encounter for screening mammogram for breast cancer        Relevant Orders    MM DIGITAL SCREENING BILATERAL

## 2015-08-07 NOTE — Assessment & Plan Note (Signed)
Blood pressure elevated above goal 140/90 and not currently maintained on medication. Discussed importance of controlling her hypertension given left ventricular hypertrophy and possibility of further end organ damage. Start losartan-hydrochlorothiazide. Continue to monitor blood pressure at home. Follow-up in one month.

## 2015-08-07 NOTE — Assessment & Plan Note (Signed)
Symptoms of cold intolerance of unknown origin. Obtain CBC, ferritin, iron panel, and TSH to rule out metabolic causes. Follow-up pending lab work.

## 2015-08-11 ENCOUNTER — Telehealth: Payer: Self-pay | Admitting: Family

## 2015-08-11 NOTE — Telephone Encounter (Signed)
Please inform patient that her blood work shows that her iron levels are okay, but slightly on the lower end, something that we can continue to monitor. Otherwise her thyroid, white/red blood cells, kidney function and electrolytes are all normal. Therefore there is no obvious cause for the cold intolerance, although hormones cannot be ruled out.

## 2015-08-12 NOTE — Telephone Encounter (Signed)
Pt aware of results 

## 2015-08-12 NOTE — Telephone Encounter (Signed)
Tried calling pt. Was unable to leave VM. Mailbox was full. Will try later.

## 2015-09-02 ENCOUNTER — Ambulatory Visit: Payer: Self-pay | Admitting: Women's Health

## 2015-09-08 ENCOUNTER — Ambulatory Visit (INDEPENDENT_AMBULATORY_CARE_PROVIDER_SITE_OTHER): Payer: BLUE CROSS/BLUE SHIELD | Admitting: Family

## 2015-09-08 ENCOUNTER — Encounter: Payer: Self-pay | Admitting: Family

## 2015-09-08 VITALS — BP 142/100 | HR 92 | Temp 98.0°F | Resp 18 | Ht 67.0 in | Wt 259.8 lb

## 2015-09-08 DIAGNOSIS — G43909 Migraine, unspecified, not intractable, without status migrainosus: Secondary | ICD-10-CM | POA: Insufficient documentation

## 2015-09-08 DIAGNOSIS — Z23 Encounter for immunization: Secondary | ICD-10-CM

## 2015-09-08 DIAGNOSIS — G479 Sleep disorder, unspecified: Secondary | ICD-10-CM | POA: Diagnosis not present

## 2015-09-08 DIAGNOSIS — G43809 Other migraine, not intractable, without status migrainosus: Secondary | ICD-10-CM

## 2015-09-08 DIAGNOSIS — I119 Hypertensive heart disease without heart failure: Secondary | ICD-10-CM | POA: Diagnosis not present

## 2015-09-08 MED ORDER — LOSARTAN POTASSIUM-HCTZ 100-12.5 MG PO TABS: 1.0000 | ORAL_TABLET | Freq: Every day | ORAL | Status: DC

## 2015-09-08 MED ORDER — SUMATRIPTAN SUCCINATE 100 MG PO TABS: 50.0000 mg | ORAL_TABLET | Freq: Every day | ORAL | Status: DC | PRN

## 2015-09-08 MED ORDER — AMLODIPINE BESYLATE 10 MG PO TABS: 10.0000 mg | ORAL_TABLET | Freq: Every day | ORAL | Status: DC

## 2015-09-08 NOTE — Assessment & Plan Note (Signed)
Symptoms consistent with migraine headaches given benign neurological exam. Headaches are refractory to OTC medication. Start Imitrex. Follow up pending usage to determine effectiveness.

## 2015-09-08 NOTE — Assessment & Plan Note (Signed)
Continues to experience hypertensive readings above goal of 140/90 with current regimen. Continue current dosage of losartan-hydrochlorothiazide. Start amlodipine. Monitor blood pressure at home daily. Follow up in 1 month with blood pressure log.

## 2015-09-08 NOTE — Patient Instructions (Addendum)
Thank you for choosing [facility].  Summary/Instructions:  Your prescription(s) have been submitted to your pharmacy or been printed and provided for you. Please take as directed and contact our office if you believe you are having problem(s) with the medication(s) or have any questions.  If your symptoms worsen or fail to improve, please contact our office for further instruction, or in case of emergency go directly to the emergency room at the closest medical facility.   Continue to take your medication as prescribed.   Take your blood pressures daily and bring your log with you.   Follow up in 1 month.   Recommendations for improving sleep:   Avoid having pets sleep in the bedroom  Avoid caffeine consumption after 4pm  Keep bedroom cool and conducive to sleep  Avoid nicotine use, especially in the evening  Avoid exercise within 2-3 hours before bedtime  Stimulus Control:   Go to bed only when sleepy  Use the bedroom for sleep and sex only  Go to another room if you are unable to fall asleep within 15 to 20 minutes  Read or engage in other quiet activities and return to bed only when sleepy.

## 2015-09-08 NOTE — Assessment & Plan Note (Signed)
Sleep disturbance refractory to OTC medications. Emphasized importance of good sleep hygiene and recommend attempts with Unisom or other sleep aids. Follow up pending medication trail.

## 2015-09-08 NOTE — Progress Notes (Signed)
Subjective:    Patient ID: Courtney Bates, female    DOB: 12-Oct-1966, 49 y.o.   MRN: [id number]  Chief Complaint  Patient presents with  . Follow-up    Hypertension, medication has been working ok, BP is still up and down    HPI:  Courtney Bates is a 50 y.o. female who  has a past medical history of Family history of thyroid disease; Allergy; GERD (gastroesophageal reflux disease); Hypertensive heart disease; Morbid obesity; Depression; Frequent headaches; and Hypertension. and presents today for a follow up office visit.   1.) Hypertension - Previously started on losartan-hydrochlorothiazide for increased blood pressure. Takes the medication as prescribed and denies adverse side effects. Reports her home blood pressure readings on average around 150s/100.   BP Readings from Last 3 Encounters:  09/08/15 142/100  08/07/15 162/98  07/15/14 132/84   2.) Headaches - Associated symptom of headaches have been waxing and waning for several weeks. Describes as throbbing with occasional sensitivity to light and sound that are refractory to the modifying factors of OTC medications including NSAIDs and Excedrin. Previously noted migraines in the past that were adequately controlled with the triptans.    3.) Sleep disturbance - Associated symptom of inability to fall asleep has been going on for some time now. Reports she has tried to work on sleep hygiene is refractory to melatonin. Benedryl does help her sleep however leaves her with a groggy sensation upon awakening.    Allergies  Allergen Reactions  . Penicillins      Current Outpatient Prescriptions on File Prior to Visit  Medication Sig Dispense Refill  . pantoprazole (PROTONIX) 40 MG tablet Take 1 tablet (40 mg total) by mouth daily. 30 tablet 3   No current facility-administered medications on file prior to visit.    No past surgical history on file.  Past Medical History  Diagnosis Date  . Family history of thyroid  disease   . Allergy   . GERD (gastroesophageal reflux disease)   . Hypertensive heart disease     ECHO 01/30/14 with concentric LVH  EF 60% and   . Morbid obesity   . Depression   . Frequent headaches   . Hypertension     Review of Systems  Constitutional: Negative for fever and chills.  Eyes: Positive for photophobia.       Negative for changes in vision.   Respiratory: Negative for chest tightness and shortness of breath.   Cardiovascular: Negative for chest pain, palpitations and leg swelling.  Neurological: Positive for headaches.  Psychiatric/Behavioral: Positive for sleep disturbance.      Objective:    BP 142/100 mmHg  Pulse 92  Temp(Src) 98 F (36.7 C) (Oral)  Resp 18  Ht  (1.702 m)  Wt 259 lb 12.8 oz (117.845 kg)  BMI 40.68 kg/m2  SpO2 99% Nursing note and vital signs reviewed.  Physical Exam  Constitutional: She is oriented to person, place, and time. She appears well-developed and well-nourished. No distress.  Cardiovascular: Normal rate, regular rhythm, normal heart sounds and intact distal pulses.   Pulmonary/Chest: Effort normal and breath sounds normal.  Neurological: She is alert and oriented to person, place, and time.  Skin: Skin is warm and dry.  Psychiatric: She has a normal mood and affect. Her behavior is normal. Judgment and thought content normal.       Assessment & Plan:   Problem List Items Addressed This Visit      Cardiovascular and Mediastinum  Hypertensive heart disease - Primary (Chronic)    Continues to experience hypertensive readings above goal of 140/90 with current regimen. Continue current dosage of losartan-hydrochlorothiazide. Start amlodipine. Monitor blood pressure at home daily. Follow up in 1 month with blood pressure log.       Relevant Medications   amLODipine (NORVASC) 10 MG tablet   losartan-hydrochlorothiazide (HYZAAR) 100-12.5 MG per tablet   Migraine    Symptoms consistent with migraine headaches given  benign neurological exam. Headaches are refractory to OTC medication. Start Imitrex. Follow up pending usage to determine effectiveness.       Relevant Medications   amLODipine (NORVASC) 10 MG tablet   losartan-hydrochlorothiazide (HYZAAR) 100-12.5 MG per tablet   SUMAtriptan (IMITREX) 100 MG tablet     Other   Sleep disturbance    Sleep disturbance refractory to OTC medications. Emphasized importance of good sleep hygiene and recommend attempts with Unisom or other sleep aids. Follow up pending medication trail.        Other Visit Diagnoses    Need for TD vaccine        Relevant Orders    Td vaccine greater than or equal to 7yo preservative free IM (Completed)    Encounter for immunization

## 2015-09-08 NOTE — Progress Notes (Signed)
Pre visit review using our clinic review tool, if applicable. No additional management support is needed unless otherwise documented below in the visit note.   Received flu and tetanus shot

## 2015-09-19 ENCOUNTER — Ambulatory Visit (INDEPENDENT_AMBULATORY_CARE_PROVIDER_SITE_OTHER): Payer: BLUE CROSS/BLUE SHIELD | Admitting: Women's Health

## 2015-09-19 ENCOUNTER — Other Ambulatory Visit (HOSPITAL_COMMUNITY)
Admission: RE | Admit: 2015-09-19 | Discharge: 2015-09-19 | Disposition: A | Discharge status: Home / Self Care | Payer: BLUE CROSS/BLUE SHIELD | Source: Ambulatory Visit | Attending: Women's Health | Admitting: Women's Health

## 2015-09-19 ENCOUNTER — Encounter: Payer: Self-pay | Admitting: Women's Health

## 2015-09-19 VITALS — BP 150/99 | Ht 67.0 in | Wt 257.4 lb

## 2015-09-19 DIAGNOSIS — A499 Bacterial infection, unspecified: Secondary | ICD-10-CM | POA: Diagnosis not present

## 2015-09-19 DIAGNOSIS — Z113 Encounter for screening for infections with a predominantly sexual mode of transmission: Secondary | ICD-10-CM

## 2015-09-19 DIAGNOSIS — Z1151 Encounter for screening for human papillomavirus (HPV): Secondary | ICD-10-CM | POA: Insufficient documentation

## 2015-09-19 DIAGNOSIS — Z01419 Encounter for gynecological examination (general) (routine) without abnormal findings: Secondary | ICD-10-CM

## 2015-09-19 DIAGNOSIS — N760 Acute vaginitis: Secondary | ICD-10-CM

## 2015-09-19 DIAGNOSIS — B9689 Other specified bacterial agents as the cause of diseases classified elsewhere: Secondary | ICD-10-CM

## 2015-09-19 LAB — WET PREP FOR TRICH, YEAST, CLUE
Trich, Wet Prep: NONE SEEN
WBC, Wet Prep HPF POC: NONE SEEN
Yeast Wet Prep HPF POC: NONE SEEN

## 2015-09-19 LAB — HEMOGLOBIN A1C
HEMOGLOBIN A1C: 5.1 % (ref <5.7)
Mean Plasma Glucose: 100 mg/dL (ref <117)

## 2015-09-19 MED ORDER — METRONIDAZOLE 500 MG PO TABS: 500.0000 mg | ORAL_TABLET | Freq: Two times a day (BID) | ORAL | Status: DC

## 2015-09-19 NOTE — Addendum Note (Signed)
Addended by: Aura Camps on: 09/19/2015 02:55 PM   Modules accepted: Orders

## 2015-09-19 NOTE — Progress Notes (Signed)
Courtney Bates 1966-02-08 [id number]    History:    Presents for annual exam.  Regular monthly cycle/condoms/new partner. Overdue for mammogram. Reports normal Pap history. Hypertension/depression/heart disease primary care manages labs and meds  Past medical history, past surgical history, family history and social history were all reviewed and documented in the EPIC chart. [employer] job. 2 children 20, 27 one grandchild all doing well. Mother healthy  ROS:  A ROS was performed and pertinent positives and negatives are included.  Exam:  Filed Vitals:   09/19/15 1407  BP: 150/99    General appearance:  Normal Thyroid:  Symmetrical, normal in size, without palpable masses or nodularity. Respiratory  Auscultation:  Clear without wheezing or rhonchi Cardiovascular  Auscultation:  Regular rate, without rubs, murmurs or gallops  Edema/varicosities:  Not grossly evident Abdominal  Soft,nontender, without masses, guarding or rebound.  Liver/spleen:  No organomegaly noted  Hernia:  None appreciated  Skin  Inspection:  Grossly normal   Breasts: Examined lying and sitting.     Right: Without masses, retractions, discharge or axillary adenopathy.     Left: Without masses, retractions, discharge or axillary adenopathy. Gentitourinary   Inguinal/mons:  Normal without inguinal adenopathy  External genitalia:  Normal  BUS/Urethra/Skene's glands:  Normal  Vagina:  Moderate white adherent discharge with odor wet prep positive for clues, TNTC bacteria  Cervix:  Normal  Uterus:   normal in size, shape and contour.  Midline and mobile  Adnexa/parametria:     Rt: Without masses or tenderness.   Lt: Without masses or tenderness.  Anus and perineum: Normal  Digital rectal exam: Normal sphincter tone without palpated masses or tenderness  Assessment/Plan:  49 y.o. SHF G2 P2 for annual exam with no complaints.  Monthly cycle/condoms STD screen Bacteria vaginosis Obesity Hypertension/heart  disease/depression-primary care manages labs and meds has follow-up scheduled for blood pressure  Plan: Reviewed blood pressure elevated today keep scheduled follow-up appointment with primary care and continue to monitorB/P,  second drug  added 1 week ago. SBE's, schedule annual screening mammogram, breast center information given and reviewed importance of annual screen. Reviewed importance of increasing regular exercise and decreasing calories for weight loss. Flagyl 500 twice daily for 7 days #14 prescription given with alcohol precautions. Call if no relief of discharge. Pap with HR HPV typing, new screening guidelines reviewed. Hemoglobin A1c, GC/Chlamydia, HIV, hep B, C, RPR. Contraception options reviewed IUDs, declines will continue with condoms.  [facility], 2:36 PM 09/19/2015 .

## 2015-09-19 NOTE — Patient Instructions (Signed)
Bacterial Vaginosis Bacterial vaginosis is a vaginal infection that occurs when the normal balance of bacteria in the vagina is disrupted. It results from an overgrowth of certain bacteria. This is the most common vaginal infection in women of childbearing age. Treatment is important to prevent complications, especially in pregnant women, as it can cause a premature delivery. CAUSES  Bacterial vaginosis is caused by an increase in harmful bacteria that are normally present in smaller amounts in the vagina. Several different kinds of bacteria can cause bacterial vaginosis. However, the reason that the condition develops is not fully understood. RISK FACTORS Certain activities or behaviors can put you at an increased risk of developing bacterial vaginosis, including:  Having a new sex partner or multiple sex partners.  Douching.  Using an intrauterine device (IUD) for contraception. Women do not get bacterial vaginosis from toilet seats, bedding, swimming pools, or contact with objects around them. SIGNS AND SYMPTOMS  Some women with bacterial vaginosis have no signs or symptoms. Common symptoms include:  Grey vaginal discharge.  A fishlike odor with discharge, especially after sexual intercourse.  Itching or burning of the vagina and vulva.  Burning or pain with urination. DIAGNOSIS  Your health care provider will take a medical history and examine the vagina for signs of bacterial vaginosis. A sample of vaginal fluid may be taken. Your health care provider will look at this sample under a microscope to check for bacteria and abnormal cells. A vaginal pH test may also be done.  TREATMENT  Bacterial vaginosis may be treated with antibiotic medicines. These may be given in the form of a pill or a vaginal cream. A second round of antibiotics may be prescribed if the condition comes back after treatment. Because bacterial vaginosis increases your risk for sexually transmitted diseases, getting  treated can help reduce your risk for chlamydia, gonorrhea, HIV, and herpes. HOME CARE INSTRUCTIONS   Only take over-the-counter or prescription medicines as directed by your health care provider.  If antibiotic medicine was prescribed, take it as directed. Make sure you finish it even if you start to feel better.  Tell all sexual partners that you have a vaginal infection. They should see their health care provider and be treated if they have problems, such as a mild rash or itching.  During treatment, it is important that you follow these instructions:  Avoid sexual activity or use condoms correctly.  Do not douche.  Avoid alcohol as directed by your health care provider.  Avoid breastfeeding as directed by your health care provider. SEEK MEDICAL CARE IF:   Your symptoms are not improving after 3 days of treatment.  You have increased discharge or pain.  You have a fever. MAKE SURE YOU:   Understand these instructions.  Will watch your condition.  Will get help right away if you are not doing well or get worse. FOR MORE INFORMATION  Centers for Disease Control and Prevention, Division of STD Prevention: [employer]raiserFraud.[employer] American Sexual Health Association (ASHA): www.ashastd.org    This information is not intended to replace advice given to you by your health care provider. Make sure you discuss any questions you have with your health care provider.   Document Released: 11/29/2005 Document Revised: 12/20/2014 Document Reviewed: 07/11/2013 Elsevier Interactive Patient Education 2016 [place] Maintenance, Female Adopting a healthy lifestyle and getting preventive care can go a long way to promote health and wellness. Talk with your health care provider about what schedule of regular examinations is right for you.  This is a good chance for you to check in with your provider about disease prevention and staying healthy. In between checkups, there are plenty of  things you can do on your own. Experts have done a lot of research about which lifestyle changes and preventive measures are most likely to keep you healthy. Ask your health care provider for more information. WEIGHT AND DIET  Eat a healthy diet  Be sure to include plenty of vegetables, fruits, low-fat dairy products, and lean protein.  Do not eat a lot of foods high in solid fats, added sugars, or salt.  Get regular exercise. This is one of the most important things you can do for your health.  Most adults should exercise for at least 150 minutes each week. The exercise should increase your heart rate and make you sweat (moderate-intensity exercise).  Most adults should also do strengthening exercises at least twice a week. This is in addition to the moderate-intensity exercise.  Maintain a healthy weight  Body mass index (BMI) is a measurement that can be used to identify possible weight problems. It estimates body fat based on height and weight. Your health care provider can help determine your BMI and help you achieve or maintain a healthy weight.  For females 58 years of age and older:   A BMI below 18.5 is considered underweight.  A BMI of 18.5 to 24.9 is normal.  A BMI of 25 to 29.9 is considered overweight.  A BMI of 30 and above is considered obese.  Watch levels of cholesterol and blood lipids  You should start having your blood tested for lipids and cholesterol at 49 years of age, then have this test every 5 years.  You may need to have your cholesterol levels checked more often if:  Your lipid or cholesterol levels are high.  You are older than 49 years of age.  You are at high risk for heart disease.  CANCER SCREENING   Lung Cancer  Lung cancer screening is recommended for adults 62-77 years old who are at high risk for lung cancer because of a history of smoking.  A yearly low-dose CT scan of the lungs is recommended for people who:  Currently  smoke.  Have quit within the past 15 years.  Have at least a 30-pack-year history of smoking. A pack year is smoking an average of one pack of cigarettes a day for 1 year.  Yearly screening should continue until it has been 15 years since you quit.  Yearly screening should stop if you develop a health problem that would prevent you from having lung cancer treatment.  Breast Cancer  Practice breast self-awareness. This means understanding how your breasts normally appear and feel.  It also means doing regular breast self-exams. Let your health care provider know about any changes, no matter how small.  If you are in your 20s or 30s, you should have a clinical breast exam (CBE) by a health care provider every 1-3 years as part of a regular health exam.  If you are 33 or older, have a CBE every year. Also consider having a breast X-ray (mammogram) every year.  If you have a family history of breast cancer, talk to your health care provider about genetic screening.  If you are at high risk for breast cancer, talk to your health care provider about having an MRI and a mammogram every year.  Breast cancer gene (BRCA) assessment is recommended for women who have family members  with BRCA-related cancers. BRCA-related cancers include:  Breast.  Ovarian.  Tubal.  Peritoneal cancers.  Results of the assessment will determine the need for genetic counseling and BRCA1 and BRCA2 testing. Cervical Cancer Your health care provider may recommend that you be screened regularly for cancer of the pelvic organs (ovaries, uterus, and vagina). This screening involves a pelvic examination, including checking for microscopic changes to the surface of your cervix (Pap test). You may be encouraged to have this screening done every 3 years, beginning at age 13.  For women ages 57-65, health care providers may recommend pelvic exams and Pap testing every 3 years, or they may recommend the Pap and pelvic  exam, combined with testing for human papilloma virus (HPV), every 5 years. Some types of HPV increase your risk of cervical cancer. Testing for HPV may also be done on women of any age with unclear Pap test results.  Other health care providers may not recommend any screening for nonpregnant women who are considered low risk for pelvic cancer and who do not have symptoms. Ask your health care provider if a screening pelvic exam is right for you.  If you have had past treatment for cervical cancer or a condition that could lead to cancer, you need Pap tests and screening for cancer for at least 20 years after your treatment. If Pap tests have been discontinued, your risk factors (such as having a new sexual partner) need to be reassessed to determine if screening should resume. Some women have medical problems that increase the chance of getting cervical cancer. In these cases, your health care provider may recommend more frequent screening and Pap tests. Colorectal Cancer  This type of cancer can be detected and often prevented.  Routine colorectal cancer screening usually begins at 49 years of age and continues through 49 years of age.  Your health care provider may recommend screening at an earlier age if you have risk factors for colon cancer.  Your health care provider may also recommend using home test kits to check for hidden blood in the stool.  A small camera at the end of a tube can be used to examine your colon directly (sigmoidoscopy or colonoscopy). This is done to check for the earliest forms of colorectal cancer.  Routine screening usually begins at age 71.  Direct examination of the colon should be repeated every 5-10 years through 49 years of age. However, you may need to be screened more often if early forms of precancerous polyps or small growths are found. Skin Cancer  Check your skin from head to toe regularly.  Tell your health care provider about any new moles or  changes in moles, especially if there is a change in a mole's shape or color.  Also tell your health care provider if you have a mole that is larger than the size of a pencil eraser.  Always use sunscreen. Apply sunscreen liberally and repeatedly throughout the day.  Protect yourself by wearing long sleeves, pants, a wide-brimmed hat, and sunglasses whenever you are outside. HEART DISEASE, DIABETES, AND HIGH BLOOD PRESSURE   High blood pressure causes heart disease and increases the risk of stroke. High blood pressure is more likely to develop in:  People who have blood pressure in the high end of the normal range (130-139/85-89 mm Hg).  People who are overweight or obese.  People who are African American.  If you are 53-74 years of age, have your blood pressure checked every 3-5 years.  If you are 32 years of age or older, have your blood pressure checked every year. You should have your blood pressure measured twice--once when you are at a hospital or clinic, and once when you are not at a hospital or clinic. Record the average of the two measurements. To check your blood pressure when you are not at a hospital or clinic, you can use:  An automated blood pressure machine at a pharmacy.  A home blood pressure monitor.  If you are between 74 years and 8 years old, ask your health care provider if you should take aspirin to prevent strokes.  Have regular diabetes screenings. This involves taking a blood sample to check your fasting blood sugar level.  If you are at a normal weight and have a low risk for diabetes, have this test once every three years after 49 years of age.  If you are overweight and have a high risk for diabetes, consider being tested at a younger age or more often. PREVENTING INFECTION  Hepatitis B  If you have a higher risk for hepatitis B, you should be screened for this virus. You are considered at high risk for hepatitis B if:  You were born in a country where  hepatitis B is common. Ask your health care provider which countries are considered high risk.  Your parents were born in a high-risk country, and you have not been immunized against hepatitis B (hepatitis B vaccine).  You have HIV or AIDS.  You use needles to inject street drugs.  You live with someone who has hepatitis B.  You have had sex with someone who has hepatitis B.  You get hemodialysis treatment.  You take certain medicines for conditions, including cancer, organ transplantation, and autoimmune conditions. Hepatitis C  Blood testing is recommended for:  Everyone born from [birth year] through 1965.  Anyone with known risk factors for hepatitis C. Sexually transmitted infections (STIs)  You should be screened for sexually transmitted infections (STIs) including gonorrhea and chlamydia if:  You are sexually active and are younger than 49 years of age.  You are older than 48 years of age and your health care provider tells you that you are at risk for this type of infection.  Your sexual activity has changed since you were last screened and you are at an increased risk for chlamydia or gonorrhea. Ask your health care provider if you are at risk.  If you do not have HIV, but are at risk, it may be recommended that you take a prescription medicine daily to prevent HIV infection. This is called pre-exposure prophylaxis (PrEP). You are considered at risk if:  You are sexually active and do not regularly use condoms or know the HIV status of your partner(s).  You take drugs by injection.  You are sexually active with a partner who has HIV. Talk with your health care provider about whether you are at high risk of being infected with HIV. If you choose to begin PrEP, you should first be tested for HIV. You should then be tested every 3 months for as long as you are taking PrEP.  PREGNANCY   If you are premenopausal and you may become pregnant, ask your health care provider about  preconception counseling.  If you may become pregnant, take 400 to 800 micrograms (mcg) of folic acid every day.  If you want to prevent pregnancy, talk to your health care provider about birth control (contraception). OSTEOPOROSIS AND MENOPAUSE   Osteoporosis  is a disease in which the bones lose minerals and strength with aging. This can result in serious bone fractures. Your risk for osteoporosis can be identified using a bone density scan.  If you are 55 years of age or older, or if you are at risk for osteoporosis and fractures, ask your health care provider if you should be screened.  Ask your health care provider whether you should take a calcium or vitamin D supplement to lower your risk for osteoporosis.  Menopause may have certain physical symptoms and risks.  Hormone replacement therapy may reduce some of these symptoms and risks. Talk to your health care provider about whether hormone replacement therapy is right for you.  HOME CARE INSTRUCTIONS   Schedule regular health, dental, and eye exams.  Stay current with your immunizations.   Do not use any tobacco products including cigarettes, chewing tobacco, or electronic cigarettes.  If you are pregnant, do not drink alcohol.  If you are breastfeeding, limit how much and how often you drink alcohol.  Limit alcohol intake to no more than 1 drink per day for nonpregnant women. One drink equals 12 ounces of beer, 5 ounces of wine, or 1 ounces of hard liquor.  Do not use street drugs.  Do not share needles.  Ask your health care provider for help if you need support or information about quitting drugs.  Tell your health care provider if you often feel depressed.  Tell your health care provider if you have ever been abused or do not feel safe at home.   This information is not intended to replace advice given to you by your health care provider. Make sure you discuss any questions you have with your health care  provider.   Document Released: 06/14/2011 Document Revised: 12/20/2014 Document Reviewed: 10/31/2013 Elsevier Interactive Patient Education [employer].

## 2015-09-20 LAB — RPR

## 2015-09-20 LAB — HIV ANTIBODY (ROUTINE TESTING W REFLEX): HIV: NONREACTIVE

## 2015-09-20 LAB — GC/CHLAMYDIA PROBE AMP
CT Probe RNA: NEGATIVE
GC Probe RNA: NEGATIVE

## 2015-09-20 LAB — HEPATITIS B SURFACE ANTIGEN: HEP B S AG: NEGATIVE

## 2015-09-20 LAB — HEPATITIS C ANTIBODY: HCV Ab: NEGATIVE

## 2015-09-23 LAB — CYTOLOGY - PAP

## 2015-10-10 ENCOUNTER — Ambulatory Visit: Payer: BLUE CROSS/BLUE SHIELD | Admitting: Family

## 2015-10-10 ENCOUNTER — Encounter: Payer: Self-pay | Admitting: Women's Health

## 2015-12-08 ENCOUNTER — Other Ambulatory Visit: Payer: Self-pay | Admitting: Family

## 2015-12-11 ENCOUNTER — Other Ambulatory Visit: Payer: Self-pay

## 2015-12-11 DIAGNOSIS — K219 Gastro-esophageal reflux disease without esophagitis: Secondary | ICD-10-CM

## 2015-12-11 MED ORDER — PANTOPRAZOLE SODIUM 40 MG PO TBEC: 40.0000 mg | DELAYED_RELEASE_TABLET | Freq: Every day | ORAL | Status: DC

## 2016-01-12 ENCOUNTER — Encounter: Payer: Self-pay | Admitting: Family

## 2016-01-12 ENCOUNTER — Ambulatory Visit (INDEPENDENT_AMBULATORY_CARE_PROVIDER_SITE_OTHER): Payer: BLUE CROSS/BLUE SHIELD | Admitting: Family

## 2016-01-12 VITALS — BP 138/86 | HR 99 | Temp 98.4°F | Resp 16 | Ht 67.0 in | Wt 266.0 lb

## 2016-01-12 DIAGNOSIS — F329 Major depressive disorder, single episode, unspecified: Secondary | ICD-10-CM

## 2016-01-12 DIAGNOSIS — F419 Anxiety disorder, unspecified: Secondary | ICD-10-CM | POA: Insufficient documentation

## 2016-01-12 DIAGNOSIS — K219 Gastro-esophageal reflux disease without esophagitis: Secondary | ICD-10-CM

## 2016-01-12 DIAGNOSIS — G43809 Other migraine, not intractable, without status migrainosus: Secondary | ICD-10-CM | POA: Diagnosis not present

## 2016-01-12 DIAGNOSIS — I119 Hypertensive heart disease without heart failure: Secondary | ICD-10-CM

## 2016-01-12 DIAGNOSIS — F32A Depression, unspecified: Secondary | ICD-10-CM | POA: Insufficient documentation

## 2016-01-12 MED ORDER — MIRTAZAPINE 15 MG PO TBDP: 15.0000 mg | ORAL_TABLET | Freq: Every day | ORAL | Status: DC

## 2016-01-12 MED ORDER — PANTOPRAZOLE SODIUM 40 MG PO TBEC: 40.0000 mg | DELAYED_RELEASE_TABLET | Freq: Every day | ORAL | Status: DC

## 2016-01-12 NOTE — Progress Notes (Signed)
Pre visit review using our clinic review tool, if applicable. No additional management support is needed unless otherwise documented below in the visit note. 

## 2016-01-12 NOTE — Patient Instructions (Signed)
Thank you for choosing [facility].  Summary/Instructions:  Your prescription(s) have been submitted to your pharmacy or been printed and provided for you. Please take as directed and contact our office if you believe you are having problem(s) with the medication(s) or have any questions.  If your symptoms worsen or fail to improve, please contact our office for further instruction, or in case of emergency go directly to the emergency room at the closest medical facility.    Major Depressive Disorder Major depressive disorder is a mental illness. It also may be called clinical depression or unipolar depression. Major depressive disorder usually causes feelings of sadness, hopelessness, or helplessness. Some people with this disorder do not feel particularly sad but lose interest in doing things they used to enjoy (anhedonia). Major depressive disorder also can cause physical symptoms. It can interfere with work, school, relationships, and other normal everyday activities. The disorder varies in severity but is longer lasting and more serious than the sadness we all feel from time to time in our lives. Major depressive disorder often is triggered by stressful life events or major life changes. Examples of these triggers include divorce, loss of your job or home, a move, and the death of a family member or close friend. Sometimes this disorder occurs for no obvious reason at all. People who have family members with major depressive disorder or bipolar disorder are at higher risk for developing this disorder, with or without life stressors. Major depressive disorder can occur at any age. It may occur just once in your life (single episode major depressive disorder). It may occur multiple times (recurrent major depressive disorder). SYMPTOMS People with major depressive disorder have either anhedonia or depressed mood on nearly a daily basis for at least 2 weeks or longer. Symptoms of depressed mood  include:  Feelings of sadness (blue or down in the dumps) or emptiness.  Feelings of hopelessness or helplessness.  Tearfulness or episodes of crying (may be observed by others).  Irritability (children and adolescents). In addition to depressed mood or anhedonia or both, people with this disorder have at least four of the following symptoms:  Difficulty sleeping or sleeping too much.   Significant change (increase or decrease) in appetite or weight.   Lack of energy or motivation.  Feelings of guilt and worthlessness.   Difficulty concentrating, remembering, or making decisions.  Unusually slow movement (psychomotor retardation) or restlessness (as observed by others).   Recurrent wishes for death, recurrent thoughts of self-harm (suicide), or a suicide attempt. People with major depressive disorder commonly have persistent negative thoughts about themselves, other people, and the world. People with severe major depressive disorder may experiencedistorted beliefs or perceptions about the world (psychotic delusions). They also may see or hear things that are not real (psychotic hallucinations). DIAGNOSIS Major depressive disorder is diagnosed through an assessment by your health care provider. Your health care provider will ask aboutaspects of your daily life, such as mood,sleep, and appetite, to see if you have the diagnostic symptoms of major depressive disorder. Your health care provider may ask about your medical history and use of alcohol or drugs, including prescription medicines. Your health care provider also may do a physical exam and blood work. This is because certain medical conditions and the use of certain substances can cause major depressive disorder-like symptoms (secondary depression). Your health care provider also may refer you to a mental health specialist for further evaluation and treatment. TREATMENT It is important to recognize the symptoms of major    depressive disorder and seek treatment. The following treatments can be prescribed for this disorder:   Medicine. Antidepressant medicines usually are prescribed. Antidepressant medicines are thought to correct chemical imbalances in the brain that are commonly associated with major depressive disorder. Other types of medicine may be added if the symptoms do not respond to antidepressant medicines alone or if psychotic delusions or hallucinations occur.  Talk therapy. Talk therapy can be helpful in treating major depressive disorder by providing support, education, and guidance. Certain types of talk therapy also can help with negative thinking (cognitive behavioral therapy) and with relationship issues that trigger this disorder (interpersonal therapy). A mental health specialist can help determine which treatment is best for you. Most people with major depressive disorder do well with a combination of medicine and talk therapy. Treatments involving electrical stimulation of the brain can be used in situations with extremely severe symptoms or when medicine and talk therapy do not work over time. These treatments include electroconvulsive therapy, transcranial magnetic stimulation, and vagal nerve stimulation.   This information is not intended to replace advice given to you by your health care provider. Make sure you discuss any questions you have with your health care provider.   Document Released: 03/26/2013 Document Revised: 12/20/2014 Document Reviewed: 03/26/2013 Elsevier Interactive Patient Education 2016 [employer]  

## 2016-01-12 NOTE — Assessment & Plan Note (Signed)
Blood pressure is controlled and below goal 140/90 with current regimen. Denies adverse side effects. Continue current dosage of losartan-hydrochlorothiazide and amlodipine. Continue to monitor blood pressure at home. Follow-up in 2 weeks for blood pressure check

## 2016-01-12 NOTE — Assessment & Plan Note (Signed)
Symptoms and exam consistent with depression secondary to life stressors. Denies suicidal ideation. Very tearful during examination. Discussed various treatment options including medication and cognitive behavioral therapy. Patient wishes to pursue both. Start mirtazapine to assist with depression and sleep. Refer to psychology for cognitive behavioral therapy. Follow-up if symptoms worsen prior to psychology appointment.

## 2016-01-12 NOTE — Progress Notes (Signed)
Subjective:    Patient ID: Courtney Bates, female    DOB: 1966-11-12, 50 y.o.   MRN: [id number]  Chief Complaint  Patient presents with  . Follow-up    following up on BP, also says imitrex does not work for her headache    HPI:  Courtney Bates is a 49 y.o. female who  has a past medical history of Family history of thyroid disease; Allergy; GERD (gastroesophageal reflux disease); Hypertensive heart disease; Morbid obesity (HCC); Depression; Frequent headaches; and Hypertension. and presents today for a follow up office visit.  1.) Hypertension - currently maintained on losartan-hydrochlorothiazide and amlodipine. Takes medications prescribed and denies adverse side effects or hypertensive events.   BP Readings from Last 3 Encounters:  01/12/16 138/86  09/19/15 150/99  09/08/15 142/100   2.) Migraines - currently maintained on sumatriptan. Takes medications prescribed and denies adverse side effects. Reports that her symptoms are not well controlled with current medication regimen. Headaches continue to be throbbing with a frequency of 4-5 times per week. Modifying factors include darkness and quiet. Aggravated by stress.   3.) Depressed mood - This is a new problem. Associated symptom of increased crying and dysphoric mood has been going on for a couple of months. Severity of her symptoms are enough to disturb her sleep pattern. Denies suicidal ideations. Does have several family stressors and describes how it is difficult to drive at night. Expresses frustration with challenges losing weight. Modifying factors include Tylenol PM and melatonin which have not provided much relief. Does have good support system.    Allergies  Allergen Reactions  . Penicillins      Current Outpatient Prescriptions on File Prior to Visit  Medication Sig Dispense Refill  . amLODipine (NORVASC) 10 MG tablet TAKE 1 TABLET BY MOUTH DAILY 30 tablet 2  . losartan-hydrochlorothiazide (HYZAAR) 100-12.5  MG per tablet Take 1 tablet by mouth daily. 30 tablet 1  . metroNIDAZOLE (FLAGYL) 500 MG tablet Take 1 tablet (500 mg total) by mouth 2 (two) times daily. 14 tablet 0   No current facility-administered medications on file prior to visit.    Past Medical History  Diagnosis Date  . Family history of thyroid disease   . Allergy   . GERD (gastroesophageal reflux disease)   . Hypertensive heart disease     ECHO 01/30/14 with concentric LVH  EF 60% and   . Morbid obesity (HCC)   . Depression   . Frequent headaches   . Hypertension      Review of Systems  Constitutional: Negative for fever and chills.  Eyes:       Negative for changes in vision.  Respiratory: Negative for chest tightness and wheezing.   Cardiovascular: Negative for chest pain, palpitations and leg swelling.  Neurological: Positive for headaches. Negative for dizziness and weakness.  Psychiatric/Behavioral: Positive for sleep disturbance and dysphoric mood. Negative for suicidal ideas.      Objective:    BP 138/86 mmHg  Pulse 99  Temp(Src) 98.4 F (36.9 C) (Oral)  Resp 16  Ht  (1.702 m)  Wt 266 lb (120.657 kg)  BMI 41.65 kg/m2  SpO2 99% Nursing note and vital signs reviewed.  Physical Exam  Constitutional: She is oriented to person, place, and time. She appears well-developed and well-nourished. No distress.  Cardiovascular: Normal rate, regular rhythm, normal heart sounds and intact distal pulses.   Pulmonary/Chest: Effort normal and breath sounds normal.  Neurological: She is alert and oriented to person,  place, and time.  Skin: Skin is warm and dry.  Psychiatric: She has a normal mood and affect. Her behavior is normal. Judgment and thought content normal.  Very tearful.        Assessment & Plan:   Problem List Items Addressed This Visit      Cardiovascular and Mediastinum   Hypertensive heart disease (Chronic)    Blood pressure is controlled and below goal 140/90 with current regimen.  Denies adverse side effects. Continue current dosage of losartan-hydrochlorothiazide and amlodipine. Continue to monitor blood pressure at home. Follow-up in 2 weeks for blood pressure check      Migraine    Headaches refractory to sumatriptan. Discontinue sumatriptan. Possibly related to depressive symptoms. Continue over-the-counter medications at this time. Follow-up if symptoms worsen or fail to improve. Advised to seek medical care if worst headache of her life develops.      Relevant Medications   mirtazapine (REMERON SOL-TAB) 15 MG disintegrating tablet     Digestive   GERD (gastroesophageal reflux disease) - Primary (Chronic)   Relevant Medications   pantoprazole (PROTONIX) 40 MG tablet     Other   Depression    Symptoms and exam consistent with depression secondary to life stressors. Denies suicidal ideation. Very tearful during examination. Discussed various treatment options including medication and cognitive behavioral therapy. Patient wishes to pursue both. Start mirtazapine to assist with depression and sleep. Refer to psychology for cognitive behavioral therapy. Follow-up if symptoms worsen prior to psychology appointment.      Relevant Medications   mirtazapine (REMERON SOL-TAB) 15 MG disintegrating tablet   Other Relevant Orders   Ambulatory referral to Psychology

## 2016-01-12 NOTE — Assessment & Plan Note (Signed)
Headaches refractory to sumatriptan. Discontinue sumatriptan. Possibly related to depressive symptoms. Continue over-the-counter medications at this time. Follow-up if symptoms worsen or fail to improve. Advised to seek medical care if worst headache of her life develops.

## 2016-01-14 ENCOUNTER — Other Ambulatory Visit: Payer: Self-pay | Admitting: Family

## 2016-01-22 ENCOUNTER — Encounter (HOSPITAL_COMMUNITY): Payer: Self-pay | Admitting: Emergency Medicine

## 2016-01-22 ENCOUNTER — Emergency Department (HOSPITAL_COMMUNITY)
Admission: EM | Admit: 2016-01-22 | Discharge: 2016-01-22 | Disposition: A | Discharge status: Home / Self Care | Payer: BLUE CROSS/BLUE SHIELD | Source: Home / Self Care | Attending: Family Medicine | Admitting: Family Medicine

## 2016-01-22 DIAGNOSIS — H109 Unspecified conjunctivitis: Secondary | ICD-10-CM | POA: Diagnosis not present

## 2016-01-22 MED ORDER — HYDROCODONE-ACETAMINOPHEN 5-325 MG PO TABS
2.0000 | ORAL_TABLET | ORAL | Status: DC | PRN
Start: 2016-01-22 — End: 2016-04-08

## 2016-01-22 MED ORDER — TOBRAMYCIN 0.3 % OP SOLN: 2.0000 [drp] | OPHTHALMIC | Status: DC

## 2016-01-22 NOTE — ED Notes (Signed)
Patient requested work note 

## 2016-01-22 NOTE — ED Provider Notes (Addendum)
CSN: [id number]     Arrival date & time 01/22/16  1307 History   First MD Initiated Contact with Patient 01/22/16 1337     Chief Complaint  Patient presents with  . Eye Pain   (Consider location/radiation/quality/duration/timing/severity/associated sxs/prior Treatment) Patient is a 50 y.o. female presenting with eye pain. The history is provided by the patient. No language interpreter was used.  Eye Pain This is a new problem. The problem occurs constantly. The problem has been gradually worsening. Pertinent negatives include no headaches. Nothing aggravates the symptoms. She has tried nothing for the symptoms. The treatment provided no relief.  Pt reports pain in her right eye.  Pt reports drainage.  Pt was around child with uri  Past Medical History  Diagnosis Date  . Family history of thyroid disease   . Allergy   . GERD (gastroesophageal reflux disease)   . Hypertensive heart disease     ECHO 01/30/14 with concentric LVH  EF 60% and   . Morbid obesity (HCC)   . Depression   . Frequent headaches   . Hypertension    History reviewed. No pertinent past surgical history. Family History  Problem Relation Age of Onset  . Hypertension Mother   . Arthritis Mother   . Other Father     brain tumor  . Heart disease Maternal Grandfather   . Heart disease Paternal Grandmother   . Heart disease Paternal Grandfather    Social History  Substance Use Topics  . Smoking status: Never Smoker   . Smokeless tobacco: Never Used  . Alcohol Use: Yes     Comment: occasionally   OB History    Gravida Para Term Preterm AB TAB SAB Ectopic Multiple Living   Review of Systems  Eyes: Positive for pain.  Neurological: Negative for headaches.  All other systems reviewed and are negative.   Allergies  Penicillins  Home Medications   Prior to Admission medications   Medication Sig Start Date End Date Taking? Authorizing Provider  amLODipine (NORVASC) 10 MG tablet TAKE 1  TABLET BY MOUTH DAILY 12/09/15   Veryl Speak, FNP  HYDROcodone-acetaminophen (NORCO/VICODIN) 5-325 MG tablet Take 2 tablets by mouth every 4 (four) hours as needed. 01/22/16   Elson Areas, PA-C  losartan-hydrochlorothiazide [facility]) 100-12.5 MG tablet TAKE 1 TABLET EVERY DAY 01/14/16   Veryl Speak, FNP  metroNIDAZOLE (FLAGYL) 500 MG tablet Take 1 tablet (500 mg total) by mouth 2 (two) times daily. 09/19/15   Harrington Challenger, NP  mirtazapine (REMERON SOL-TAB) 15 MG disintegrating tablet Take 1 tablet (15 mg total) by mouth at bedtime. 01/12/16   Veryl Speak, FNP  pantoprazole (PROTONIX) 40 MG tablet Take 1 tablet (40 mg total) by mouth daily. Appointment needed for further refills. 01/12/16   Veryl Speak, FNP  tobramycin (TOBREX) 0.3 % ophthalmic solution Place 2 drops into the right eye every 4 (four) hours. 01/22/16   Elson Areas, PA-C   Meds Ordered and Administered this Visit  Medications - No data to display  BP 144/96 mmHg  Pulse 99  Temp(Src) 98 F (36.7 C) (Oral)  Resp 16  SpO2 98%  LMP 01/19/2016 No data found.   Physical Exam  Constitutional: She appears well-developed and well-nourished.  HENT:  Head: Normocephalic and atraumatic.  Eyes: Conjunctivae and EOM are normal. Pupils are equal, round, and reactive to light.  Injected right conjunctiva,  Tearing    Neck: Normal range of motion. Neck supple.  Cardiovascular: Normal rate.   Abdominal: Soft. There is no tenderness.  Musculoskeletal: Normal range of motion.  Skin: Skin is warm.    ED Course  Procedures (including critical care time)  Labs Review Labs Reviewed - No data to display  Imaging Review No results found.   Visual Acuity Review  Right Eye Distance: 20/30 Left Eye Distance: 20/40 Bilateral Distance: 20/30  Right Eye Near:   Left Eye Near:    Bilateral Near:         MDM   1. Conjunctivitis of right eye    Meds ordered this encounter  Medications  . tobramycin (TOBREX)  0.3 % ophthalmic solution    Sig: Place 2 drops into the right eye every 4 (four) hours.    Dispense:  5 mL    Refill:  0    Order Specific Question:  Supervising Provider    Answer:  Linna Hoff [phone]  . HYDROcodone-acetaminophen (NORCO/VICODIN) 5-325 MG tablet    Sig: Take 2 tablets by mouth every 4 (four) hours as needed.    Dispense:  10 tablet    Refill:  0    Order Specific Question:  Supervising Provider    Answer:  Linna Hoff [phone]  An After Visit Summary was printed and given to the patient.   Lonia Skinner [place], PA-C 01/22/16 1427  Lonia Skinner [place], PA-C 02/13/16 1625

## 2016-01-22 NOTE — Discharge Instructions (Signed)

## 2016-01-22 NOTE — ED Notes (Signed)
Patient requested a work note, verified with karen, pa

## 2016-01-22 NOTE — ED Notes (Signed)
Right eye pain, redness, tearing.  No known injury.  Onset yesterday morning.  Patient does not wear corrective lenses.  Patient reports right eye blurry.

## 2016-02-08 ENCOUNTER — Other Ambulatory Visit: Payer: Self-pay | Admitting: Family

## 2016-04-05 ENCOUNTER — Other Ambulatory Visit: Payer: Self-pay | Admitting: Family

## 2016-04-08 ENCOUNTER — Ambulatory Visit (INDEPENDENT_AMBULATORY_CARE_PROVIDER_SITE_OTHER): Payer: BLUE CROSS/BLUE SHIELD | Admitting: Family

## 2016-04-08 ENCOUNTER — Other Ambulatory Visit (INDEPENDENT_AMBULATORY_CARE_PROVIDER_SITE_OTHER): Payer: BLUE CROSS/BLUE SHIELD

## 2016-04-08 ENCOUNTER — Telehealth: Payer: Self-pay | Admitting: Family

## 2016-04-08 ENCOUNTER — Encounter: Payer: Self-pay | Admitting: Family

## 2016-04-08 VITALS — BP 130/94 | HR 94 | Temp 98.1°F | Resp 16 | Ht 67.0 in | Wt 268.0 lb

## 2016-04-08 DIAGNOSIS — Z0000 Encounter for general adult medical examination without abnormal findings: Secondary | ICD-10-CM

## 2016-04-08 LAB — IBC PANEL
Iron: 33 ug/dL — ABNORMAL LOW (ref 42–145)
SATURATION RATIOS: 7.9 % — AB (ref 20.0–50.0)
TRANSFERRIN: 297 mg/dL (ref 212.0–360.0)

## 2016-04-08 LAB — LIPID PANEL
CHOL/HDL RATIO: 4
Cholesterol: 164 mg/dL (ref 0–200)
HDL: 37 mg/dL — ABNORMAL LOW (ref >39.00)
LDL CALC: 103 mg/dL — AB (ref 0–99)
NONHDL: 127.01
TRIGLYCERIDES: 122 mg/dL (ref 0.0–149.0)
VLDL: 24.4 mg/dL (ref 0.0–40.0)

## 2016-04-08 LAB — CBC
HCT: 38.1 % (ref 36.0–46.0)
HEMOGLOBIN: 12.9 g/dL (ref 12.0–15.0)
MCHC: 33.8 g/dL (ref 30.0–36.0)
MCV: 79.5 fl (ref 78.0–100.0)
PLATELETS: 345 10*3/uL (ref 150.0–400.0)
RBC: 4.8 Mil/uL (ref 3.87–5.11)
RDW: 13.5 % (ref 11.5–15.5)
WBC: 9.8 10*3/uL (ref 4.0–10.5)

## 2016-04-08 LAB — COMPREHENSIVE METABOLIC PANEL
ALT: 7 U/L (ref 0–35)
AST: 12 U/L (ref 0–37)
Albumin: 4.3 g/dL (ref 3.5–5.2)
Alkaline Phosphatase: 64 U/L (ref 39–117)
BUN: 10 mg/dL (ref 6–23)
CALCIUM: 9.5 mg/dL (ref 8.4–10.5)
CHLORIDE: 103 meq/L (ref 96–112)
CO2: 29 meq/L (ref 19–32)
CREATININE: 1.03 mg/dL (ref 0.40–1.20)
GFR: 73.14 mL/min (ref >60.00)
GLUCOSE: 96 mg/dL (ref 70–99)
Potassium: 3.5 mEq/L (ref 3.5–5.1)
SODIUM: 138 meq/L (ref 135–145)
Total Bilirubin: 0.5 mg/dL (ref 0.2–1.2)
Total Protein: 7.3 g/dL (ref 6.0–8.3)

## 2016-04-08 LAB — VITAMIN D 25 HYDROXY (VIT D DEFICIENCY, FRACTURES): VITD: 10.05 ng/mL — AB (ref 30.00–100.00)

## 2016-04-08 LAB — TSH: TSH: 1.2 u[IU]/mL (ref 0.35–4.50)

## 2016-04-08 LAB — FERRITIN: FERRITIN: 20.7 ng/mL (ref 10.0–291.0)

## 2016-04-08 NOTE — Telephone Encounter (Signed)
Please inform patient that her thyroid function, kidney function, liver function, and electrolytes are within the normal limits. Her cholesterol looks okay, however her HDL or good cholesterol is slightly low. This can be increased through dietary changes including fish oil, fish products, walnuts, flaxseed, or other sources omega-3 fatty acids. Aerobic exercise of 30 minutes of moderate level activity daily calcium help to increase her HDL levels. Her vitamin D was significantly low. For this I will send in 6 weeks worth of high-dose vitamin D to be followed up with 2000 units of vitamin D daily to additional months. Her iron levels are significantly low indicating iron deficiency with no evidence of anemia. Therefore would like her to start ferrous sulfate 325 mg daily. We can plan to follow-up in a month as we discussed for weight checks and follow-up for blood work..Marland Kitchen

## 2016-04-08 NOTE — Progress Notes (Signed)
Pre visit review using our clinic review tool, if applicable. No additional management support is needed unless otherwise documented below in the visit note. 

## 2016-04-08 NOTE — Assessment & Plan Note (Signed)
Recommend weight loss of approximately 5-10% of current body weight through lifestyle changes in the next 6-8 months.Recommend increasing physical activity to 30 minutes of moderate level activity daily. Encourage nutritional intake that focuses on nutrient dense foods and is moderate, varied, and balanced and is low in saturated fats and processed/sugary foods. May also consider addition of medication to assist in weight loss.

## 2016-04-08 NOTE — Assessment & Plan Note (Signed)
1) Anticipatory Guidance: Discussed importance of wearing a seatbelt while driving and not texting while driving; changing batteries in smoke detector at least once annually; wearing suntan lotion when outside; eating a balanced and moderate diet; getting physical activity at least 30 minutes per day.  2) Immunizations / Screenings / Labs:  All immunizations are up-to-date per recommendations. Obtain vitamin D for vitamin D screening. Obtain ferritin and IBC panel for iron screening from previous iron deficiency anemia. All other screenings are up-to-date per recommendations. Obtain CBC, CMET, Lipid profile and TSH.   Overall well exam with risk factors for cardiovascular disease including hypertension and obesity. Blood pressure is slightly elevated above goal although generally well maintained at home with current regimen. Discussed importance of weight loss to improve both blood pressure and overall health including calorie counting and lifestyle changes. Recommend weight loss of approximately 5-10% of current body weight. Recommend increasing physical activity to 30 minutes of moderate level activity daily. Encourage nutritional intake that focuses on nutrient dense foods and is moderate, varied, and balanced and is low in saturated fats and processed/sugary foods. Continue other healthy lifestyle behaviors and choices. Follow-up prevention exam in 1 year. Follow-up office visit 1 month to determine effectiveness of weight loss and to recheck blood pressure.

## 2016-04-08 NOTE — Progress Notes (Signed)
Subjective:    Patient ID: Courtney Bates, female    DOB: 11/01/1966, 50 y.o.   MRN: [id number]  Chief Complaint  Patient presents with  . CPE    not fasting    HPI:  Courtney Bates is a 50 y.o. female who presents today for an annual wellness visit.   1) Health Maintenance -   Diet - Averages about 2 meals per day consisting of chicken, fish, fruits, and vegetables. Occasional beef; Lots of water; Limited caffeine  Exercise - Walks daily; 15 min daily    2) Preventative Exams / Immunizations:  Dental -- Up to date  Vision -- Up to date   Health Maintenance  Topic Date Due  . INFLUENZA VACCINE  07/13/2016  . PAP SMEAR  09/18/2018  . TETANUS/TDAP  09/07/2025  . HIV Screening  Completed    Immunization History  Administered Date(s) Administered  . Influenza,inj,Quad PF,36+ Mos 09/08/2015  . Td 09/08/2015    Allergies  Allergen Reactions  . Penicillins      Outpatient Prescriptions Prior to Visit  Medication Sig Dispense Refill  . amLODipine (NORVASC) 10 MG tablet TAKE 1 TABLET BY MOUTH DAILY 30 tablet 2  . losartan-hydrochlorothiazide (HYZAAR) 100-12.5 MG tablet TAKE 1 TABLET EVERY DAY 30 tablet 2  . mirtazapine (REMERON SOL-TAB) 15 MG disintegrating tablet TAKE 1 TABLET BY MOUTH AT BEDTIME. 30 tablet 0  . pantoprazole (PROTONIX) 40 MG tablet Take 1 tablet (40 mg total) by mouth daily. Appointment needed for further refills. 90 tablet 0  . HYDROcodone-acetaminophen (NORCO/VICODIN) 5-325 MG tablet Take 2 tablets by mouth every 4 (four) hours as needed. 10 tablet 0  . metroNIDAZOLE (FLAGYL) 500 MG tablet Take 1 tablet (500 mg total) by mouth 2 (two) times daily. 14 tablet 0  . tobramycin (TOBREX) 0.3 % ophthalmic solution Place 2 drops into the right eye every 4 (four) hours. 5 mL 0   No facility-administered medications prior to visit.     Past Medical History  Diagnosis Date  . Family history of thyroid disease   . Allergy   . GERD  (gastroesophageal reflux disease)   . Hypertensive heart disease     ECHO 01/30/14 with concentric LVH  EF 60% and   . Morbid obesity (HCC)   . Depression   . Frequent headaches   . Hypertension      History reviewed. No pertinent past surgical history.   Family History  Problem Relation Age of Onset  . Hypertension Mother   . Arthritis Mother   . Other Father     brain tumor  . Heart disease Maternal Grandfather   . Heart disease Paternal Grandmother   . Heart disease Paternal Grandfather      Social History   Social History  . Marital Status: Single    Spouse Name: N/A  . Number of Children: 2  . Years of Education: 14   Occupational History  . Sewer    Social History Main Topics  . Smoking status: Never Smoker   . Smokeless tobacco: Never Used  . Alcohol Use: Yes     Comment: occasionally  . Drug Use: No  . Sexual Activity: Yes    Birth Control/ Protection: Condom   Other Topics Concern  . 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 HENT: Head: Denies headache or neck pain Ears: Denies changes in hearing, ringing in ears, earache, drainage Nose: Denies discharge, stuffiness, itching, nosebleed, sinus pain Throat: Denies sore throat, hoarseness, dry mouth, sores, thrush Eyes: Denies loss/changes in vision, pain, redness, blurry/double vision, flashing lights Cardiovascular: Denies chest pain/discomfort, tightness, palpitations, shortness of breath with activity, difficulty lying down, swelling, sudden awakening with shortness of breath Respiratory: Denies shortness of breath, cough, sputum production, wheezing Gastrointestinal: Denies dysphasia, heartburn, change in appetite, nausea,  change in bowel habits, rectal bleeding, constipation, diarrhea, yellow skin or eyes Genitourinary: Denies frequency, urgency, burning/pain, blood in urine, incontinence, change in urinary strength. Musculoskeletal: Denies muscle/joint pain, stiffness, back pain, redness or swelling of joints, trauma Skin: Denies rashes, lumps, itching, dryness, color changes, or hair/nail changes Neurological: Denies dizziness, fainting, seizures, weakness, numbness, tingling, tremor Psychiatric - Denies nervousness, stress, depression or memory loss Endocrine: Denies heat or cold intolerance, sweating, frequent urination, excessive thirst, changes in appetite Hematologic: Denies ease of bruising or bleeding     Objective:     BP 130/94 mmHg  Pulse 94  Temp(Src) 98.1 F (36.7 C) (Oral)  Resp 16  Ht  (1.702 m)  Wt 268 lb (121.564 kg)  BMI 41.96 kg/m2  SpO2 99% Nursing note and vital signs reviewed.  Physical Exam  Constitutional: She is oriented to person, place, and time. She appears well-developed and well-nourished.  HENT:  Head: Normocephalic.  Right Ear: Hearing, tympanic membrane, external ear and ear canal normal.  Left Ear: Hearing, tympanic membrane, external ear and ear canal normal.  Nose: Nose normal.  Mouth/Throat: Uvula is midline, oropharynx is clear and moist and mucous membranes are normal.  Eyes: Conjunctivae and EOM are normal. Pupils are equal, round, and reactive to light.  Neck: Neck supple. No JVD present. No tracheal deviation present. No thyromegaly present.  Cardiovascular: Normal rate, regular rhythm, normal heart sounds and intact distal pulses.   Pulmonary/Chest: Effort normal and breath sounds normal.  Abdominal: Soft. Bowel sounds are normal. She exhibits no distension and no mass. There is no tenderness. There is no rebound and no guarding.  Musculoskeletal: Normal range of motion. She exhibits no edema or tenderness.  Lymphadenopathy:    She has no cervical  adenopathy.  Neurological: She is alert and oriented to person, place, and time. She has normal reflexes. No cranial nerve deficit. She exhibits normal muscle tone. Coordination normal.  Skin: Skin is warm and dry.  Psychiatric: She has a normal mood and affect. Her behavior is normal. Judgment and thought content normal.       Assessment & Plan:   Problem List Items Addressed This Visit      Other   Morbid obesity (HCC) (Chronic)    Recommend weight loss of approximately 5-10% of current body weight through lifestyle changes in the next 6-8 months.Recommend increasing physical activity to 30 minutes of moderate level activity daily. Encourage nutritional intake that focuses on nutrient dense foods and is moderate, varied, and balanced and is low in saturated fats and processed/sugary foods. May also consider addition of medication to assist in weight loss.       Routine general medical examination at a health care facility - Primary    1) Anticipatory Guidance: Discussed importance of wearing a seatbelt while driving and not texting while driving; changing batteries in smoke detector at least once  annually; wearing suntan lotion when outside; eating a balanced and moderate diet; getting physical activity at least 30 minutes per day.  2) Immunizations / Screenings / Labs:  All immunizations are up-to-date per recommendations. Obtain vitamin D for vitamin D screening. Obtain ferritin and IBC panel for iron screening from previous iron deficiency anemia. All other screenings are up-to-date per recommendations. Obtain CBC, CMET, Lipid profile and TSH.   Overall well exam with risk factors for cardiovascular disease including hypertension and obesity. Blood pressure is slightly elevated above goal although generally well maintained at home with current regimen. Discussed importance of weight loss to improve both blood pressure and overall health including calorie counting and lifestyle changes.  Recommend weight loss of approximately 5-10% of current body weight. Recommend increasing physical activity to 30 minutes of moderate level activity daily. Encourage nutritional intake that focuses on nutrient dense foods and is moderate, varied, and balanced and is low in saturated fats and processed/sugary foods. Continue other healthy lifestyle behaviors and choices. Follow-up prevention exam in 1 year. Follow-up office visit 1 month to determine effectiveness of weight loss and to recheck blood pressure.       Relevant Orders   CBC   Comprehensive metabolic panel   Lipid panel   TSH   Vitamin D (25 hydroxy)   Ferritin   IBC panel

## 2016-04-08 NOTE — Patient Instructions (Addendum)
Thank you for choosing [facility].  Summary/Instructions:  MyFitness Pal - calorie and exercise tracking app.  Your prescription(s) have been submitted to your pharmacy or been printed and provided for you. Please take as directed and contact our office if you believe you are having problem(s) with the medication(s) or have any questions.  Please stop by the lab on the basement level of the building for your blood work. Your results will be released to [place] (or called to you) after review, usually within 72 hours after test completion. If any changes need to be made, you will be notified at that same time.    Health Maintenance, Female Adopting a healthy lifestyle and getting preventive care can go a long way to promote health and wellness. Talk with your health care provider about what schedule of regular examinations is right for you. This is a good chance for you to check in with your provider about disease prevention and staying healthy. In between checkups, there are plenty of things you can do on your own. Experts have done a lot of research about which lifestyle changes and preventive measures are most likely to keep you healthy. Ask your health care provider for more information. WEIGHT AND DIET  Eat a healthy diet  Be sure to include plenty of vegetables, fruits, low-fat dairy products, and lean protein.  Do not eat a lot of foods high in solid fats, added sugars, or salt.  Get regular exercise. This is one of the most important things you can do for your health.  Most adults should exercise for at least 150 minutes each week. The exercise should increase your heart rate and make you sweat (moderate-intensity exercise).  Most adults should also do strengthening exercises at least twice a week. This is in addition to the moderate-intensity exercise.  Maintain a healthy weight  Body mass index (BMI) is a measurement that can be used to identify possible weight problems.  It estimates body fat based on height and weight. Your health care provider can help determine your BMI and help you achieve or maintain a healthy weight.  For females 3 years of age and older:   A BMI below 18.5 is considered underweight.  A BMI of 18.5 to 24.9 is normal.  A BMI of 25 to 29.9 is considered overweight.  A BMI of 30 and above is considered obese.  Watch levels of cholesterol and blood lipids  You should start having your blood tested for lipids and cholesterol at 50 years of age, then have this test every 5 years.  You may need to have your cholesterol levels checked more often if:  Your lipid or cholesterol levels are high.  You are older than 50 years of age.  You are at high risk for heart disease.  CANCER SCREENING   Lung Cancer  Lung cancer screening is recommended for adults 75-36 years old who are at high risk for lung cancer because of a history of smoking.  A yearly low-dose CT scan of the lungs is recommended for people who:  Currently smoke.  Have quit within the past 15 years.  Have at least a 30-pack-year history of smoking. A pack year is smoking an average of one pack of cigarettes a day for 1 year.  Yearly screening should continue until it has been 15 years since you quit.  Yearly screening should stop if you develop a health problem that would prevent you from having lung cancer treatment.  Breast Cancer  Practice breast self-awareness. This means understanding how your breasts normally appear and feel.  It also means doing regular breast self-exams. Let your health care provider know about any changes, no matter how small.  If you are in your 20s or 30s, you should have a clinical breast exam (CBE) by a health care provider every 1-3 years as part of a regular health exam.  If you are 54 or older, have a CBE every year. Also consider having a breast X-ray (mammogram) every year.  If you have a family history of breast cancer,  talk to your health care provider about genetic screening.  If you are at high risk for breast cancer, talk to your health care provider about having an MRI and a mammogram every year.  Breast cancer gene (BRCA) assessment is recommended for women who have family members with BRCA-related cancers. BRCA-related cancers include:  Breast.  Ovarian.  Tubal.  Peritoneal cancers.  Results of the assessment will determine the need for genetic counseling and BRCA1 and BRCA2 testing. Cervical Cancer Your health care provider may recommend that you be screened regularly for cancer of the pelvic organs (ovaries, uterus, and vagina). This screening involves a pelvic examination, including checking for microscopic changes to the surface of your cervix (Pap test). You may be encouraged to have this screening done every 3 years, beginning at age 34.  For women ages 69-65, health care providers may recommend pelvic exams and Pap testing every 3 years, or they may recommend the Pap and pelvic exam, combined with testing for human papilloma virus (HPV), every 5 years. Some types of HPV increase your risk of cervical cancer. Testing for HPV may also be done on women of any age with unclear Pap test results.  Other health care providers may not recommend any screening for nonpregnant women who are considered low risk for pelvic cancer and who do not have symptoms. Ask your health care provider if a screening pelvic exam is right for you.  If you have had past treatment for cervical cancer or a condition that could lead to cancer, you need Pap tests and screening for cancer for at least 20 years after your treatment. If Pap tests have been discontinued, your risk factors (such as having a new sexual partner) need to be reassessed to determine if screening should resume. Some women have medical problems that increase the chance of getting cervical cancer. In these cases, your health care provider may recommend more  frequent screening and Pap tests. Colorectal Cancer  This type of cancer can be detected and often prevented.  Routine colorectal cancer screening usually begins at 50 years of age and continues through 50 years of age.  Your health care provider may recommend screening at an earlier age if you have risk factors for colon cancer.  Your health care provider may also recommend using home test kits to check for hidden blood in the stool.  A small camera at the end of a tube can be used to examine your colon directly (sigmoidoscopy or colonoscopy). This is done to check for the earliest forms of colorectal cancer.  Routine screening usually begins at age 27.  Direct examination of the colon should be repeated every 5-10 years through 50 years of age. However, you may need to be screened more often if early forms of precancerous polyps or small growths are found. Skin Cancer  Check your skin from head to toe regularly.  Tell your health care provider about any  new moles or changes in moles, especially if there is a change in a mole's shape or color.  Also tell your health care provider if you have a mole that is larger than the size of a pencil eraser.  Always use sunscreen. Apply sunscreen liberally and repeatedly throughout the day.  Protect yourself by wearing long sleeves, pants, a wide-brimmed hat, and sunglasses whenever you are outside. HEART DISEASE, DIABETES, AND HIGH BLOOD PRESSURE   High blood pressure causes heart disease and increases the risk of stroke. High blood pressure is more likely to develop in:  People who have blood pressure in the high end of the normal range (130-139/85-89 mm Hg).  People who are overweight or obese.  People who are African American.  If you are 41-16 years of age, have your blood pressure checked every 3-5 years. If you are 7 years of age or older, have your blood pressure checked every year. You should have your blood pressure measured  twice--once when you are at a hospital or clinic, and once when you are not at a hospital or clinic. Record the average of the two measurements. To check your blood pressure when you are not at a hospital or clinic, you can use:  An automated blood pressure machine at a pharmacy.  A home blood pressure monitor.  If you are between 76 years and 38 years old, ask your health care provider if you should take aspirin to prevent strokes.  Have regular diabetes screenings. This involves taking a blood sample to check your fasting blood sugar level.  If you are at a normal weight and have a low risk for diabetes, have this test once every three years after 51 years of age.  If you are overweight and have a high risk for diabetes, consider being tested at a younger age or more often. PREVENTING INFECTION  Hepatitis B  If you have a higher risk for hepatitis B, you should be screened for this virus. You are considered at high risk for hepatitis B if:  You were born in a country where hepatitis B is common. Ask your health care provider which countries are considered high risk.  Your parents were born in a high-risk country, and you have not been immunized against hepatitis B (hepatitis B vaccine).  You have HIV or AIDS.  You use needles to inject street drugs.  You live with someone who has hepatitis B.  You have had sex with someone who has hepatitis B.  You get hemodialysis treatment.  You take certain medicines for conditions, including cancer, organ transplantation, and autoimmune conditions. Hepatitis C  Blood testing is recommended for:  Everyone born from [birth year] through 1965.  Anyone with known risk factors for hepatitis C. Sexually transmitted infections (STIs)  You should be screened for sexually transmitted infections (STIs) including gonorrhea and chlamydia if:  You are sexually active and are younger than 50 years of age.  You are older than 50 years of age and your  health care provider tells you that you are at risk for this type of infection.  Your sexual activity has changed since you were last screened and you are at an increased risk for chlamydia or gonorrhea. Ask your health care provider if you are at risk.  If you do not have HIV, but are at risk, it may be recommended that you take a prescription medicine daily to prevent HIV infection. This is called pre-exposure prophylaxis (PrEP). You are considered at risk  if:  You are sexually active and do not regularly use condoms or know the HIV status of your partner(s).  You take drugs by injection.  You are sexually active with a partner who has HIV. Talk with your health care provider about whether you are at high risk of being infected with HIV. If you choose to begin PrEP, you should first be tested for HIV. You should then be tested every 3 months for as long as you are taking PrEP.  PREGNANCY   If you are premenopausal and you may become pregnant, ask your health care provider about preconception counseling.  If you may become pregnant, take 400 to 800 micrograms (mcg) of folic acid every day.  If you want to prevent pregnancy, talk to your health care provider about birth control (contraception). OSTEOPOROSIS AND MENOPAUSE   Osteoporosis is a disease in which the bones lose minerals and strength with aging. This can result in serious bone fractures. Your risk for osteoporosis can be identified using a bone density scan.  If you are 69 years of age or older, or if you are at risk for osteoporosis and fractures, ask your health care provider if you should be screened.  Ask your health care provider whether you should take a calcium or vitamin D supplement to lower your risk for osteoporosis.  Menopause may have certain physical symptoms and risks.  Hormone replacement therapy may reduce some of these symptoms and risks. Talk to your health care provider about whether hormone replacement  therapy is right for you.  HOME CARE INSTRUCTIONS   Schedule regular health, dental, and eye exams.  Stay current with your immunizations.   Do not use any tobacco products including cigarettes, chewing tobacco, or electronic cigarettes.  If you are pregnant, do not drink alcohol.  If you are breastfeeding, limit how much and how often you drink alcohol.  Limit alcohol intake to no more than 1 drink per day for nonpregnant women. One drink equals 12 ounces of beer, 5 ounces of wine, or 1 ounces of hard liquor.  Do not use street drugs.  Do not share needles.  Ask your health care provider for help if you need support or information about quitting drugs.  Tell your health care provider if you often feel depressed.  Tell your health care provider if you have ever been abused or do not feel safe at home.   This information is not intended to replace advice given to you by your health care provider. Make sure you discuss any questions you have with your health care provider.   Document Released: 06/14/2011 Document Revised: 12/20/2014 Document Reviewed: 10/31/2013 Elsevier Interactive Patient Education [employer].

## 2016-04-09 MED ORDER — CHOLECALCIFEROL 1.25 MG (50000 UT) PO CAPS: 50000.0000 [IU] | ORAL_CAPSULE | ORAL | Status: DC

## 2016-04-09 NOTE — Telephone Encounter (Signed)
Sent!

## 2016-04-09 NOTE — Telephone Encounter (Signed)
Please send Vitamin D to pts pharmacy.

## 2016-04-12 MED ORDER — CLONAZEPAM 0.5 MG PO TABS: 0.5000 mg | ORAL_TABLET | Freq: Two times a day (BID) | ORAL | Status: DC | PRN

## 2016-04-12 NOTE — Addendum Note (Signed)
Addended by: Jeanine LuzALONE, Ramses Klecka D on: 04/12/2016 01:05 PM   Modules accepted: Orders

## 2016-04-12 NOTE — Telephone Encounter (Signed)
Medication sent to pharmacy and faxed.

## 2016-04-12 NOTE — Telephone Encounter (Signed)
Called pt to let her know. VM was full.

## 2016-04-12 NOTE — Telephone Encounter (Signed)
Pt would like to know if you can give her something for nerves. She said she was already given something to help her sleep but its not working well and thinks it could be just her bc she is dealing with a lot of stress. Please advise

## 2016-05-07 ENCOUNTER — Other Ambulatory Visit: Payer: Self-pay | Admitting: Family

## 2016-05-11 ENCOUNTER — Encounter: Payer: Self-pay | Admitting: Family

## 2016-05-11 ENCOUNTER — Other Ambulatory Visit (INDEPENDENT_AMBULATORY_CARE_PROVIDER_SITE_OTHER): Payer: BLUE CROSS/BLUE SHIELD

## 2016-05-11 ENCOUNTER — Ambulatory Visit (INDEPENDENT_AMBULATORY_CARE_PROVIDER_SITE_OTHER): Payer: BLUE CROSS/BLUE SHIELD | Admitting: Family

## 2016-05-11 ENCOUNTER — Telehealth: Payer: Self-pay | Admitting: Family

## 2016-05-11 VITALS — BP 142/94 | HR 81 | Temp 98.3°F | Resp 16 | Ht 67.0 in | Wt 265.0 lb

## 2016-05-11 DIAGNOSIS — I119 Hypertensive heart disease without heart failure: Secondary | ICD-10-CM

## 2016-05-11 DIAGNOSIS — E611 Iron deficiency: Secondary | ICD-10-CM | POA: Diagnosis not present

## 2016-05-11 LAB — CBC
HCT: 39.7 % (ref 36.0–46.0)
Hemoglobin: 13.4 g/dL (ref 12.0–15.0)
MCHC: 33.8 g/dL (ref 30.0–36.0)
MCV: 80.5 fl (ref 78.0–100.0)
Platelets: 369 10*3/uL (ref 150.0–400.0)
RBC: 4.92 Mil/uL (ref 3.87–5.11)
RDW: 13.8 % (ref 11.5–15.5)
WBC: 10.4 10*3/uL (ref 4.0–10.5)

## 2016-05-11 LAB — IBC PANEL
IRON: 45 ug/dL (ref 42–145)
Saturation Ratios: 10.9 % — ABNORMAL LOW (ref 20.0–50.0)
TRANSFERRIN: 295 mg/dL (ref 212.0–360.0)

## 2016-05-11 MED ORDER — AMLODIPINE BESYLATE 10 MG PO TABS: 10.0000 mg | ORAL_TABLET | Freq: Every day | ORAL | Status: DC

## 2016-05-11 MED ORDER — LOSARTAN POTASSIUM-HCTZ 100-25 MG PO TABS: 1.0000 | ORAL_TABLET | Freq: Every day | ORAL | Status: DC

## 2016-05-11 NOTE — Progress Notes (Signed)
Subjective:    Patient ID: Courtney Bates, female    DOB: 12/24/1965, 50 y.o.   MRN: [id number]  Chief Complaint  Patient presents with  . Follow-up    hypertension    HPI:  Courtney Bates is a 50 y.o. female who  has a past medical history of Family history of thyroid disease; Allergy; GERD (gastroesophageal reflux disease); Hypertensive heart disease; Morbid obesity (HCC); Depression; Frequent headaches; and Hypertension. and presents today for a follow up.  1.) Hypertension -  Currently maintained on amlodipine and hydrochlorothiazide-losartan. Reports taking the medication as described and denies adverse side effects. Denies symptoms of end organ damage. Does describe occasional headache. Continues to work on nutrition and physical activity.   BP Readings from Last 3 Encounters:  05/11/16 142/94  04/08/16 130/94  01/22/16 144/96    2.) Iron deficiency - Previous found to have iron deficiency. Reports taking ferrous sulfate as prescribed and denies adverse side effects. Has had some darker stools. Continues to have cold spells at times.    Allergies  Allergen Reactions  . Penicillins      Current Outpatient Prescriptions on File Prior to Visit  Medication Sig Dispense Refill  . clonazePAM (KLONOPIN) 0.5 MG tablet Take 1 tablet (0.5 mg total) by mouth 2 (two) times daily as needed for anxiety. 30 tablet 0  . D3-50 50000 units capsule TAKE 1 CAPSULE (50,000 UNITS TOTAL) BY MOUTH ONCE A WEEK. 6 capsule 0  . mirtazapine (REMERON SOL-TAB) 15 MG disintegrating tablet TAKE 1 TABLET BY MOUTH AT BEDTIME. 30 tablet 0  . pantoprazole (PROTONIX) 40 MG tablet Take 1 tablet (40 mg total) by mouth daily. Appointment needed for further refills. 90 tablet 0   No current facility-administered medications on file prior to visit.    Review of Systems  Constitutional: Negative for fever and chills.  Eyes:       Negative for changes in vision  Respiratory: Negative for cough, chest  tightness and wheezing.   Cardiovascular: Negative for chest pain, palpitations and leg swelling.  Neurological: Positive for headaches. Negative for dizziness, weakness and light-headedness.      Objective:    BP 142/94 mmHg  Pulse 81  Temp(Src) 98.3 F (36.8 C) (Oral)  Resp 16  Ht 5\' 7"  (1.702 m)  Wt 265 lb (120.203 kg)  BMI 41.50 kg/m2  SpO2 99% Nursing note and vital signs reviewed.  Physical Exam  Constitutional: She is oriented to person, place, and time. She appears well-developed and well-nourished. No distress.  Cardiovascular: Normal rate, regular rhythm, normal heart sounds and intact distal pulses.   Pulmonary/Chest: Effort normal and breath sounds normal.  Neurological: She is alert and oriented to person, place, and time.  Skin: Skin is warm and dry.  Psychiatric: She has a normal mood and affect. Her behavior is normal. Judgment and thought content normal.       Assessment & Plan:   Problem List Items Addressed This Visit      Cardiovascular and Mediastinum   Hypertensive heart disease - Primary (Chronic)    Blood pressure remains above goal 140/90 with current regimen. Does express occasional headaches with no worse headache of her life or neck stiffness. Increase losartan-hydrochlorothiazide. Continue current dosage of amlodipine. Continue to work on nutrition and physical activity. Encouraged to monitor blood pressure at home. Follow-up in 3 months or sooner.      Relevant Medications   losartan-hydrochlorothiazide (HYZAAR) 100-25 MG tablet   amLODipine (NORVASC) 10 MG  tablet     Other   Iron deficiency    Previously diagnosed with iron deficiency within normal hemoglobin level. No adverse side effects of current ferrous sulfate. Obtain CBC and IBC panel. Continue ferrous sulfate pending blood work results.      Relevant Orders   IBC panel   CBC       I have discontinued Ms. Kurka's losartan-hydrochlorothiazide. I have also changed her  amLODipine. Additionally, I am having her start on losartan-hydrochlorothiazide. Lastly, I am having her maintain her pantoprazole, mirtazapine, clonazePAM, and D3-50.   Meds ordered this encounter  Medications  . losartan-hydrochlorothiazide (HYZAAR) 100-25 MG tablet    Sig: Take 1 tablet by mouth daily.    Dispense:  90 tablet    Refill:  0    Order Specific Question:  Supervising Provider    Answer:  Hillard Danker A [[id number]]  . amLODipine (NORVASC) 10 MG tablet    Sig: Take 1 tablet (10 mg total) by mouth daily.    Dispense:  90 tablet    Refill:  0    Order Specific Question:  Supervising Provider    Answer:  Hillard Danker A [[id number]]     Follow-up:  Pending blood work.  Jeanine Luz, FNP

## 2016-05-11 NOTE — Progress Notes (Signed)
Pre visit review using our clinic review tool, if applicable. No additional management support is needed unless otherwise documented below in the visit note. 

## 2016-05-11 NOTE — Telephone Encounter (Signed)
Please inform patient that her iron levels have improved, however I would recommend that she increase her iron intake to 2 times daily. We will recheck in 3 months.

## 2016-05-11 NOTE — Patient Instructions (Addendum)
Thank you for choosing [facility].  Summary/Instructions:  Please increase your losartan-hydrochlorothiazide.  Continue to monitor blood pressure at home.  Continue to take iron as prescribed.   Your prescription(s) have been submitted to your pharmacy or been printed and provided for you. Please take as directed and contact our office if you believe you are having problem(s) with the medication(s) or have any questions.  If your symptoms worsen or fail to improve, please contact our office for further instruction, or in case of emergency go directly to the emergency room at the closest medical facility.

## 2016-05-11 NOTE — Assessment & Plan Note (Signed)
Previously diagnosed with iron deficiency within normal hemoglobin level. No adverse side effects of current ferrous sulfate. Obtain CBC and IBC panel. Continue ferrous sulfate pending blood work results.

## 2016-05-11 NOTE — Assessment & Plan Note (Signed)
Blood pressure remains above goal 140/90 with current regimen. Does express occasional headaches with no worse headache of her life or neck stiffness. Increase losartan-hydrochlorothiazide. Continue current dosage of amlodipine. Continue to work on nutrition and physical activity. Encouraged to monitor blood pressure at home. Follow-up in 3 months or sooner.

## 2016-05-13 NOTE — Telephone Encounter (Signed)
Pt aware of results 

## 2016-07-01 ENCOUNTER — Telehealth: Payer: Self-pay

## 2016-07-01 MED ORDER — CLONAZEPAM 0.5 MG PO TABS: 0.5000 mg | ORAL_TABLET | Freq: Two times a day (BID) | ORAL | Status: DC | PRN

## 2016-07-01 NOTE — Telephone Encounter (Signed)
Medication refill

## 2016-07-01 NOTE — Telephone Encounter (Signed)
Pt called requesting a refill of Clonazepam, please advise

## 2016-07-02 ENCOUNTER — Other Ambulatory Visit: Payer: Self-pay | Admitting: Family

## 2016-07-02 NOTE — Telephone Encounter (Signed)
rx faxed to [facility].  

## 2016-07-23 ENCOUNTER — Other Ambulatory Visit: Payer: Self-pay | Admitting: Family

## 2016-07-23 DIAGNOSIS — K219 Gastro-esophageal reflux disease without esophagitis: Secondary | ICD-10-CM

## 2016-08-30 ENCOUNTER — Other Ambulatory Visit: Payer: Self-pay | Admitting: Family

## 2016-08-30 DIAGNOSIS — I119 Hypertensive heart disease without heart failure: Secondary | ICD-10-CM

## 2016-09-24 ENCOUNTER — Ambulatory Visit (INDEPENDENT_AMBULATORY_CARE_PROVIDER_SITE_OTHER): Payer: BLUE CROSS/BLUE SHIELD | Admitting: Women's Health

## 2016-09-24 ENCOUNTER — Encounter: Payer: BLUE CROSS/BLUE SHIELD | Admitting: Women's Health

## 2016-09-24 ENCOUNTER — Encounter: Payer: Self-pay | Admitting: Women's Health

## 2016-09-24 VITALS — BP 128/80 | Ht 67.0 in | Wt 275.0 lb

## 2016-09-24 DIAGNOSIS — Z23 Encounter for immunization: Secondary | ICD-10-CM

## 2016-09-24 DIAGNOSIS — Z01419 Encounter for gynecological examination (general) (routine) without abnormal findings: Secondary | ICD-10-CM | POA: Diagnosis not present

## 2016-09-24 DIAGNOSIS — Z113 Encounter for screening for infections with a predominantly sexual mode of transmission: Secondary | ICD-10-CM | POA: Diagnosis not present

## 2016-09-24 DIAGNOSIS — N946 Dysmenorrhea, unspecified: Secondary | ICD-10-CM

## 2016-09-24 MED ORDER — IBUPROFEN 600 MG PO TABS: 600.0000 mg | ORAL_TABLET | Freq: Three times a day (TID) | ORAL | 1 refills | Status: DC | PRN

## 2016-09-24 NOTE — Progress Notes (Signed)
Courtney Bates [birth year] [id number]    History:    Presents for annual exam. Regular monthly cycles with increased cramping. New partner/condoms used. Scheduled mammogram 10/08/16. Hx of normal Pap and mammograms. HTN, iron deffiency, and depression managed by primary care. Takes over the counter vitamin D supplements and iron. Aware of need for colonoscopy at age 50. 18 pound weight gain in the past year. Vitamin D 10   03/2016  Past medical history, past surgical history, family history and social history were all reviewed and documented in the EPIC chart.  2 children and 50 year old grandson.   ROS:  A ROS was performed and pertinent positives and negatives are included.  Exam:  Vitals:   09/24/16 1504  BP: 128/80  Weight: 275 lb (124.7 kg)  Height: 5\' 7"  (1.702 m)   Body mass index is 43.07 kg/m.   General appearance:  Normal Thyroid:  Symmetrical, normal in size, without palpable masses or nodularity. Respiratory  Auscultation:  Clear without wheezing or rhonchi Cardiovascular  Auscultation:  Regular rate, without rubs, murmurs or gallops  Edema/varicosities:  Not grossly evident Abdominal  Soft,nontender, large without masses, guarding or rebound.  Liver/spleen:  No organomegaly noted  Hernia:  None appreciated  Skin  Inspection:  Grossly normal   Breasts: Examined lying and sitting.     Right: Without masses, retractions, discharge or axillary adenopathy.     Left: Without masses, retractions, discharge or axillary adenopathy. Gentitourinary   Inguinal/mons:  Normal without inguinal adenopathy  External genitalia:  Normal  BUS/Urethra/Skene's glands:  Normal  Vagina:  Weak vaginal walls with small amount of white discharge.   Cervix:  Normal  Uterus:  antverted, normal in size, shape and contour.  Midline and mobile  Adnexa/parametria:     Rt: Without masses or tenderness.   Lt: Without masses or tenderness.  Anus and perineum: Normal  Digital rectal  exam: Hemorrhoid at base of anal opening. Normal sphincter tone without palpated masses or tenderness  Assessment/Plan:  50 y.o.  G2P2 for annual exam.   Monthly Cycle/ dysmenorrhea /Condoms STD Screen Obesity HTN/ Depression/ Iron deffiency - primary care manages labs and meds Asymptomatic hemorrhoid  Plan: SBE's, continue annual screening mammogram, regular cardio type exercise encouraged. Discussed 18 pound weight gain and adhering to low carb diet. Contraception reviewed declines, will continue condoms . Motrin 600 mg every 8 hours when necessary with menses prescription given with instructions. [facility] contact info given to schedule colonoscopy.  GC/chlamydia pending, HIV, hepatitis B, C, RPR, vitamin D level.  Flu vaccine given   Harrington ChallengerYOUNG,NANCY J [facility] A Medical CorporationWHNP, 3:39 PM 09/24/2016

## 2016-09-24 NOTE — Patient Instructions (Addendum)
Health Maintenance, Female Adopting a healthy lifestyle and getting preventive care can go a long way to promote health and wellness. Talk with your health care provider about what schedule of regular examinations is right for you. This is a good chance for you to check in with your provider about disease prevention and staying healthy. In between checkups, there are plenty of things you can do on your own. Experts have done a lot of research about which lifestyle changes and preventive measures are most likely to keep you healthy. Ask your health care provider for more information. WEIGHT AND DIET  Eat a healthy diet  Be sure to include plenty of vegetables, fruits, low-fat dairy products, and lean protein.  Do not eat a lot of foods high in solid fats, added sugars, or salt.  Get regular exercise. This is one of the most important things you can do for your health.  Most adults should exercise for at least 150 minutes each week. The exercise should increase your heart rate and make you sweat (moderate-intensity exercise).  Most adults should also do strengthening exercises at least twice a week. This is in addition to the moderate-intensity exercise.  Maintain a healthy weight  Body mass index (BMI) is a measurement that can be used to identify possible weight problems. It estimates body fat based on height and weight. Your health care provider can help determine your BMI and help you achieve or maintain a healthy weight.  For females 20 years of age and older:   A BMI below 18.5 is considered underweight.  A BMI of 18.5 to 24.9 is normal.  A BMI of 25 to 29.9 is considered overweight.  A BMI of 30 and above is considered obese.  Watch levels of cholesterol and blood lipids  You should start having your blood tested for lipids and cholesterol at 50 years of age, then have this test every 5 years.  You may need to have your cholesterol levels checked more often if:  Your lipid  or cholesterol levels are high.  You are older than 50 years of age.  You are at high risk for heart disease.  CANCER SCREENING   Lung Cancer  Lung cancer screening is recommended for adults 55-80 years old who are at high risk for lung cancer because of a history of smoking.  A yearly low-dose CT scan of the lungs is recommended for people who:  Currently smoke.  Have quit within the past 15 years.  Have at least a 30-pack-year history of smoking. A pack year is smoking an average of one pack of cigarettes a day for 1 year.  Yearly screening should continue until it has been 15 years since you quit.  Yearly screening should stop if you develop a health problem that would prevent you from having lung cancer treatment.  Breast Cancer  Practice breast self-awareness. This means understanding how your breasts normally appear and feel.  It also means doing regular breast self-exams. Let your health care provider know about any changes, no matter how small.  If you are in your 20s or 30s, you should have a clinical breast exam (CBE) by a health care provider every 1-3 years as part of a regular health exam.  If you are 40 or older, have a CBE every year. Also consider having a breast X-ray (mammogram) every year.  If you have a family history of breast cancer, talk to your health care provider about genetic screening.  If you   are at high risk for breast cancer, talk to your health care provider about having an MRI and a mammogram every year.  Breast cancer gene (BRCA) assessment is recommended for women who have family members with BRCA-related cancers. BRCA-related cancers include:  Breast.  Ovarian.  Tubal.  Peritoneal cancers.  Results of the assessment will determine the need for genetic counseling and BRCA1 and BRCA2 testing. Cervical Cancer Your health care provider may recommend that you be screened regularly for cancer of the pelvic organs (ovaries, uterus, and  vagina). This screening involves a pelvic examination, including checking for microscopic changes to the surface of your cervix (Pap test). You may be encouraged to have this screening done every 3 years, beginning at age 21.  For women ages 30-65, health care providers may recommend pelvic exams and Pap testing every 3 years, or they may recommend the Pap and pelvic exam, combined with testing for human papilloma virus (HPV), every 5 years. Some types of HPV increase your risk of cervical cancer. Testing for HPV may also be done on women of any age with unclear Pap test results.  Other health care providers may not recommend any screening for nonpregnant women who are considered low risk for pelvic cancer and who do not have symptoms. Ask your health care provider if a screening pelvic exam is right for you.  If you have had past treatment for cervical cancer or a condition that could lead to cancer, you need Pap tests and screening for cancer for at least 20 years after your treatment. If Pap tests have been discontinued, your risk factors (such as having a new sexual partner) need to be reassessed to determine if screening should resume. Some women have medical problems that increase the chance of getting cervical cancer. In these cases, your health care provider may recommend more frequent screening and Pap tests. Colorectal Cancer  This type of cancer can be detected and often prevented.  Routine colorectal cancer screening usually begins at 50 years of age and continues through 50 years of age.  Your health care provider may recommend screening at an earlier age if you have risk factors for colon cancer.  Your health care provider may also recommend using home test kits to check for hidden blood in the stool.  A small camera at the end of a tube can be used to examine your colon directly (sigmoidoscopy or colonoscopy). This is done to check for the earliest forms of colorectal  cancer.  Routine screening usually begins at age 50.  Direct examination of the colon should be repeated every 5-10 years through 50 years of age. However, you may need to be screened more often if early forms of precancerous polyps or small growths are found. Skin Cancer  Check your skin from head to toe regularly.  Tell your health care provider about any new moles or changes in moles, especially if there is a change in a mole's shape or color.  Also tell your health care provider if you have a mole that is larger than the size of a pencil eraser.  Always use sunscreen. Apply sunscreen liberally and repeatedly throughout the day.  Protect yourself by wearing long sleeves, pants, a wide-brimmed hat, and sunglasses whenever you are outside. HEART DISEASE, DIABETES, AND HIGH BLOOD PRESSURE   High blood pressure causes heart disease and increases the risk of stroke. High blood pressure is more likely to develop in:  People who have blood pressure in the high end   of the normal range (130-139/85-89 mm Hg).  People who are overweight or obese.  People who are African American.  If you are 38-23 years of age, have your blood pressure checked every 3-5 years. If you are 61 years of age or older, have your blood pressure checked every year. You should have your blood pressure measured twice--once when you are at a hospital or clinic, and once when you are not at a hospital or clinic. Record the average of the two measurements. To check your blood pressure when you are not at a hospital or clinic, you can use:  An automated blood pressure machine at a pharmacy.  A home blood pressure monitor.  If you are between 45 years and 39 years old, ask your health care provider if you should take aspirin to prevent strokes.  Have regular diabetes screenings. This involves taking a blood sample to check your fasting blood sugar level.  If you are at a normal weight and have a low risk for diabetes,  have this test once every three years after 50 years of age.  If you are overweight and have a high risk for diabetes, consider being tested at a younger age or more often. PREVENTING INFECTION  Hepatitis B  If you have a higher risk for hepatitis B, you should be screened for this virus. You are considered at high risk for hepatitis B if:  You were born in a country where hepatitis B is common. Ask your health care provider which countries are considered high risk.  Your parents were born in a high-risk country, and you have not been immunized against hepatitis B (hepatitis B vaccine).  You have HIV or AIDS.  You use needles to inject street drugs.  You live with someone who has hepatitis B.  You have had sex with someone who has hepatitis B.  You get hemodialysis treatment.  You take certain medicines for conditions, including cancer, organ transplantation, and autoimmune conditions. Hepatitis C  Blood testing is recommended for:  Everyone born from [birth year] through 1965.  Anyone with known risk factors for hepatitis C. Sexually transmitted infections (STIs)  You should be screened for sexually transmitted infections (STIs) including gonorrhea and chlamydia if:  You are sexually active and are younger than 50 years of age.  You are older than 50 years of age and your health care provider tells you that you are at risk for this type of infection.  Your sexual activity has changed since you were last screened and you are at an increased risk for chlamydia or gonorrhea. Ask your health care provider if you are at risk.  If you do not have HIV, but are at risk, it may be recommended that you take a prescription medicine daily to prevent HIV infection. This is called pre-exposure prophylaxis (PrEP). You are considered at risk if:  You are sexually active and do not regularly use condoms or know the HIV status of your partner(s).  You take drugs by injection.  You are sexually  active with a partner who has HIV. Talk with your health care provider about whether you are at high risk of being infected with HIV. If you choose to begin PrEP, you should first be tested for HIV. You should then be tested every 3 months for as long as you are taking PrEP.  PREGNANCY   If you are premenopausal and you may become pregnant, ask your health care provider about preconception counseling.  If you may  become pregnant, take 400 to 800 micrograms (mcg) of folic acid every day.  If you want to prevent pregnancy, talk to your health care provider about birth control (contraception). OSTEOPOROSIS AND MENOPAUSE   Osteoporosis is a disease in which the bones lose minerals and strength with aging. This can result in serious bone fractures. Your risk for osteoporosis can be identified using a bone density scan.  If you are 42 years of age or older, or if you are at risk for osteoporosis and fractures, ask your health care provider if you should be screened.  Ask your health care provider whether you should take a calcium or vitamin D supplement to lower your risk for osteoporosis.  Menopause may have certain physical symptoms and risks.  Hormone replacement therapy may reduce some of these symptoms and risks. Talk to your health care provider about whether hormone replacement therapy is right for you.  HOME CARE INSTRUCTIONS   Schedule regular health, dental, and eye exams.  Stay current with your immunizations.   Do not use any tobacco products including cigarettes, chewing tobacco, or electronic cigarettes.  If you are pregnant, do not drink alcohol.  If you are breastfeeding, limit how much and how often you drink alcohol.  Limit alcohol intake to no more than 1 drink per day for nonpregnant women. One drink equals 12 ounces of beer, 5 ounces of wine, or 1 ounces of hard liquor.  Do not use street drugs.  Do not share needles.  Ask your health care provider for help if  you need support or information about quitting drugs.  Tell your health care provider if you often feel depressed.  Tell your health care provider if you have ever been abused or do not feel safe at home.   This information is not intended to replace advice given to you by your health care provider. Make sure you discuss any questions you have with your health care provider.   Document Released: 06/14/2011 Document Revised: 12/20/2014 Document Reviewed: 10/31/2013 Elsevier Interactive Patient Education 2016 [place] Carbohydrate Counting for Diabetes Mellitus Carbohydrate counting is a method for keeping track of the amount of carbohydrates you eat. Eating carbohydrates naturally increases the level of sugar (glucose) in your blood, so it is important for you to know the amount that is okay for you to have in every meal. Carbohydrate counting helps keep the level of glucose in your blood within normal limits. The amount of carbohydrates allowed is different for every person. A dietitian can help you calculate the amount that is right for you. Once you know the amount of carbohydrates you can have, you can count the carbohydrates in the foods you want to eat. Carbohydrates are found in the following foods:  Grains, such as breads and cereals.  Dried beans and soy products.  Starchy vegetables, such as potatoes, peas, and corn.  Fruit and fruit juices.  Milk and yogurt.  Sweets and snack foods, such as cake, cookies, candy, chips, soft drinks, and fruit drinks. CARBOHYDRATE COUNTING There are two ways to count the carbohydrates in your food. You can use either of the methods or a combination of both. Reading the "Nutrition Facts" on [place] The "Nutrition Facts" is an area that is included on the labels of almost all packaged food and beverages in the [place]. It includes the serving size of that food or beverage and information about the nutrients in each serving of  the food, including the grams (g)  of carbohydrate per serving.  Decide the number of servings of this food or beverage that you will be able to eat or drink. Multiply that number of servings by the number of grams of carbohydrate that is listed on the label for that serving. The total will be the amount of carbohydrates you will be having when you eat or drink this food or beverage. Learning Standard Serving Sizes of Food When you eat food that is not packaged or does not include "Nutrition Facts" on the label, you need to measure the servings in order to count the amount of carbohydrates.A serving of most carbohydrate-rich foods contains about 15 g of carbohydrates. The following list includes serving sizes of carbohydrate-rich foods that provide 15 g ofcarbohydrate per serving:   1 slice of bread (1 oz) or 1 six-inch tortilla.    of a hamburger bun or English muffin.  4-6 crackers.   cup unsweetened dry cereal.    cup hot cereal.   cup rice or pasta.    cup mashed potatoes or  of a large baked potato.  1 cup fresh fruit or one small piece of fruit.    cup canned or frozen fruit or fruit juice.  1 cup milk.   cup plain fat-free yogurt or yogurt sweetened with artificial sweeteners.   cup cooked dried beans or starchy vegetable, such as peas, corn, or potatoes.  Decide the number of standard-size servings that you will eat. Multiply that number of servings by 15 (the grams of carbohydrates in that serving). For example, if you eat 2 cups of strawberries, you will have eaten 2 servings and 30 g of carbohydrates (2 servings x 15 g = 30 g). For foods such as soups and casseroles, in which more than one food is mixed in, you will need to count the carbohydrates in each food that is included. EXAMPLE OF CARBOHYDRATE COUNTING Sample Dinner  3 oz chicken breast.   cup of brown rice.   cup of corn.  1 cup milk.   1 cup strawberries with sugar-free whipped topping.   Carbohydrate Calculation Step 1: Identify the foods that contain carbohydrates:   Rice.   Corn.   Milk.   Strawberries. Step 2:Calculate the number of servings eaten of each:   2 servings of rice.   1 serving of corn.   1 serving of milk.   1 serving of strawberries. Step 3: Multiply each of those number of servings by 15 g:   2 servings of rice x 15 g = 30 g.   1 serving of corn x 15 g = 15 g.   1 serving of milk x 15 g = 15 g.   1 serving of strawberries x 15 g = 15 g. Step 4: Add together all of the amounts to find the total grams of carbohydrates eaten: 30 g + 15 g + 15 g + 15 g = 75 g.   This information is not intended to replace advice given to you by your health care provider. Make sure you discuss any questions you have with your health care provider.  COLONOSCOPY  Dr Carlean Purl  [phone]  [facility]    Document Released: 11/29/2005 Document Revised: 12/20/2014 Document Reviewed: 10/26/2013 Elsevier Interactive Patient Education [employer].

## 2016-09-25 LAB — VITAMIN D 25 HYDROXY (VIT D DEFICIENCY, FRACTURES): VIT D 25 HYDROXY: 36 ng/mL (ref 30–100)

## 2016-09-25 LAB — GC/CHLAMYDIA PROBE AMP
CT Probe RNA: NOT DETECTED
GC Probe RNA: NOT DETECTED

## 2016-09-25 LAB — HIV ANTIBODY (ROUTINE TESTING W REFLEX): HIV: NONREACTIVE

## 2016-09-25 LAB — RPR: RPR: REACTIVE — AB

## 2016-09-25 LAB — HEPATITIS C ANTIBODY: HCV Ab: NEGATIVE

## 2016-09-25 LAB — HEPATITIS B SURFACE ANTIGEN: HEP B S AG: NEGATIVE

## 2016-09-25 LAB — RPR TITER: RPR Titer: 1:1 {titer}

## 2016-09-27 LAB — FLUORESCENT TREPONEMAL AB(FTA)-IGG-BLD: Fluorescent Treponemal ABS: NONREACTIVE

## 2016-10-08 DIAGNOSIS — Z1231 Encounter for screening mammogram for malignant neoplasm of breast: Secondary | ICD-10-CM | POA: Diagnosis not present

## 2016-10-31 ENCOUNTER — Other Ambulatory Visit: Payer: Self-pay | Admitting: Family

## 2016-10-31 DIAGNOSIS — K219 Gastro-esophageal reflux disease without esophagitis: Secondary | ICD-10-CM

## 2016-11-16 ENCOUNTER — Other Ambulatory Visit: Payer: Self-pay | Admitting: Family

## 2016-11-16 DIAGNOSIS — I119 Hypertensive heart disease without heart failure: Secondary | ICD-10-CM

## 2016-11-29 ENCOUNTER — Ambulatory Visit (INDEPENDENT_AMBULATORY_CARE_PROVIDER_SITE_OTHER): Payer: BLUE CROSS/BLUE SHIELD | Admitting: Family

## 2016-11-29 ENCOUNTER — Encounter: Payer: Self-pay | Admitting: Family

## 2016-11-29 VITALS — BP 128/80 | HR 85 | Temp 98.4°F | Resp 16 | Ht 67.0 in | Wt 274.0 lb

## 2016-11-29 DIAGNOSIS — I119 Hypertensive heart disease without heart failure: Secondary | ICD-10-CM | POA: Diagnosis not present

## 2016-11-29 DIAGNOSIS — Z9189 Other specified personal risk factors, not elsewhere classified: Secondary | ICD-10-CM | POA: Diagnosis not present

## 2016-11-29 DIAGNOSIS — N926 Irregular menstruation, unspecified: Secondary | ICD-10-CM | POA: Diagnosis not present

## 2016-11-29 NOTE — Assessment & Plan Note (Signed)
Blood pressure appears well controlled and below goal 140/90 with current regimen and no adverse side effects. Does have occasional migraine headaches but denies worse headache of life with no new symptoms of end organ damage noted on physical exam. Encouraged to continue monitoring blood pressure at home and follow low-sodium diet. Continue current dosage of losartan-hydrochlorothiazide and amlodipine.

## 2016-11-29 NOTE — Patient Instructions (Addendum)
Thank you for choosing [facility].  SUMMARY AND INSTRUCTIONS:  Medication:  Continue to take your medications as prescribed.   Start black cohosh which is available over the counter.   Your prescription(s) have been submitted to your pharmacy or been printed and provided for you. Please take as directed and contact our office if you believe you are having problem(s) with the medication(s) or have any questions.  Follow up:  If your symptoms worsen or fail to improve, please contact our office for further instruction, or in case of emergency go directly to the emergency room at the closest medical facility.   Menopause Menopause is the normal time of life when menstrual periods stop completely. Menopause is complete when you have missed 12 consecutive menstrual periods. It usually occurs between the ages of 48 years and 55 years. Very rarely does a woman develop menopause before the age of 40 years. At menopause, your ovaries stop producing the female hormones estrogen and progesterone. This can cause undesirable symptoms and also affect your health. Sometimes the symptoms may occur 4-5 years before the menopause begins. There is no relationship between menopause and:  Oral contraceptives.  Number of children you had.  Race.  The age your menstrual periods started (menarche). Heavy smokers and very thin women may develop menopause earlier in life. What are the causes?  The ovaries stop producing the female hormones estrogen and progesterone. Other causes include:  Surgery to remove both ovaries.  The ovaries stop functioning for no known reason.  Tumors of the pituitary gland in the brain.  Medical disease that affects the ovaries and hormone production.  Radiation treatment to the abdomen or pelvis.  Chemotherapy that affects the ovaries. What are the signs or symptoms?  Hot flashes.  Night sweats.  Decrease in sex drive.  Vaginal dryness and thinning of the  vagina causing painful intercourse.  Dryness of the skin and developing wrinkles.  Headaches.  Tiredness.  Irritability.  Memory problems.  Weight gain.  Bladder infections.  Hair growth of the face and chest.  Infertility. More serious symptoms include:  Loss of bone (osteoporosis) causing breaks (fractures).  Depression.  Hardening and narrowing of the arteries (atherosclerosis) causing heart attacks and strokes. How is this diagnosed?  When the menstrual periods have stopped for 12 straight months.  Physical exam.  Hormone studies of the blood. How is this treated? There are many treatment choices and nearly as many questions about them. The decisions to treat or not to treat menopausal changes is an individual choice made with your health care provider. Your health care provider can discuss the treatments with you. Together, you can decide which treatment will work best for you. Your treatment choices may include:  Hormone therapy (estrogen and progesterone).  Non-hormonal medicines.  Treating the individual symptoms with medicine (for example antidepressants for depression).  Herbal medicines that may help specific symptoms.  Counseling by a psychiatrist or psychologist.  Group therapy.  Lifestyle changes including:  Eating healthy.  Regular exercise.  Limiting caffeine and alcohol.  Stress management and meditation.  No treatment. Follow these instructions at home:  Take the medicine your health care provider gives you as directed.  Get plenty of sleep and rest.  Exercise regularly.  Eat a diet that contains calcium (good for the bones) and soy products (acts like estrogen hormone).  Avoid alcoholic beverages.  Do not smoke.  If you have hot flashes, dress in layers.  Take supplements, calcium, and vitamin D to  strengthen bones.  You can use over-the-counter lubricants or moisturizers for vaginal dryness.  Group therapy is sometimes  very helpful.  Acupuncture may be helpful in some cases. Contact a health care provider if:  You are not sure you are in menopause.  You are having menopausal symptoms and need advice and treatment.  You are still having menstrual periods after age 50 years.  You have pain with intercourse.  Menopause is complete (no menstrual period for 12 months) and you develop vaginal bleeding.  You need a referral to a specialist (gynecologist, psychiatrist, or psychologist) for treatment. Get help right away if:  You have severe depression.  You have excessive vaginal bleeding.  You fell and think you have a broken bone.  You have pain when you urinate.  You develop leg or chest pain.  You have a fast pounding heart beat (palpitations).  You have severe headaches.  You develop vision problems.  You feel a lump in your breast.  You have abdominal pain or severe indigestion. This information is not intended to replace advice given to you by your health care provider. Make sure you discuss any questions you have with your health care provider. Document Released: 02/19/2004 Document Revised: 05/06/2016 Document Reviewed: 06/28/2013 Elsevier Interactive Patient Education  2017 [employer]

## 2016-11-29 NOTE — Progress Notes (Signed)
Subjective:    Patient ID: Courtney Bates, female    DOB: 1966/08/01, 50 y.o.   MRN: [id number]  Chief Complaint  Patient presents with  . Follow-up    has not had a period in almost 3 months, possible menopause, hypertension    HPI:  Courtney Bates is a 50 y.o. female who  has a past medical history of Allergy; Depression; Family history of thyroid disease; Frequent headaches; GERD (gastroesophageal reflux disease); Hypertension; Hypertensive heart disease; and Morbid obesity (HCC). and presents today for a follow up office visit.  1.) Hypertension - Currently maintained on losartan-hctz and amlodipine. Reports taking the medication as prescribed and denies adverse side effects. Has a headache on occasion. No new symptoms of end organ damage. Working on following a low sodium diet. Blood pressure at home has been below goal.  BP Readings from Last 3 Encounters:  11/29/16 128/80  09/24/16 128/80  05/11/16 (!) 142/94    2.) Menopause concern - This is a new problem. Associated symptom of absent menstrual cycle has been going on for about 3 months. Has noted increase in irritability and hot flashes. Endorses night sweats on occasion. There are no modifying factors or attempted treatments.    Allergies  Allergen Reactions  . Penicillins       Outpatient Medications Prior to Visit  Medication Sig Dispense Refill  . amLODipine (NORVASC) 10 MG tablet TAKE 1 TABLET EVERY DAY 90 tablet 0  . clonazePAM (KLONOPIN) 0.5 MG tablet Take 1 tablet (0.5 mg total) by mouth 2 (two) times daily as needed for anxiety. 60 tablet 0  . D3-50 50000 units capsule TAKE 1 CAPSULE (50,000 UNITS TOTAL) BY MOUTH ONCE A WEEK. 6 capsule 0  . ibuprofen (ADVIL,MOTRIN) 600 MG tablet Take 1 tablet (600 mg total) by mouth every 8 (eight) hours as needed. 60 tablet 1  . losartan-hydrochlorothiazide (HYZAAR) 100-25 MG tablet TAKE 1 TABLET BY MOUTH DAILY. 90 tablet 0  . mirtazapine (REMERON SOL-TAB) 15 MG  disintegrating tablet TAKE 1 TABLET BY MOUTH AT BEDTIME. 30 tablet 0  . pantoprazole (PROTONIX) 40 MG tablet Take 1 tablet (40 mg total) by mouth daily. Appointment needed for further refills. 90 tablet 0  . pantoprazole (PROTONIX) 40 MG tablet TAKE 1 TABLET (40 MG TOTAL) BY MOUTH DAILY. APPOINTMENT NEEDED FOR FURTHER REFILLS. 90 tablet 0   No facility-administered medications prior to visit.      Review of Systems  Constitutional: Negative for chills and fever.  Eyes:       Negative for changes in vision  Respiratory: Negative for cough, chest tightness and wheezing.   Cardiovascular: Negative for chest pain, palpitations and leg swelling.  Neurological: Negative for dizziness, weakness and light-headedness.      Objective:    BP 128/80 (BP Location: Left Arm, Patient Position: Sitting, Cuff Size: Large)   Pulse 85   Temp 98.4 F (36.9 C) (Oral)   Resp 16   Ht 5\' 7"  (1.702 m)   Wt 274 lb (124.3 kg)   BMI 42.91 kg/m  Nursing note and vital signs reviewed.  Physical Exam  Constitutional: She is oriented to person, place, and time. She appears well-developed and well-nourished. No distress.  Cardiovascular: Normal rate, regular rhythm, normal heart sounds and intact distal pulses.   Pulmonary/Chest: Effort normal and breath sounds normal.  Neurological: She is alert and oriented to person, place, and time.  Skin: Skin is warm and dry.  Psychiatric: She has a normal mood  and affect. Her behavior is normal. Judgment and thought content normal.       Assessment & Plan:   Problem List Items Addressed This Visit      Cardiovascular and Mediastinum   Hypertensive heart disease - Primary (Chronic)    Blood pressure appears well controlled and below goal 140/90 with current regimen and no adverse side effects. Does have occasional migraine headaches but denies worse headache of life with no new symptoms of end organ damage noted on physical exam. Encouraged to continue monitoring  blood pressure at home and follow low-sodium diet. Continue current dosage of losartan-hydrochlorothiazide and amlodipine.        Other   Menstrual changes    Notes menstrual changes with no mention of cycle in the past 3 months. Has began to experience hot flashes on occasion. Recommend black cohosh. Continue to monitor as appears menopause may be starting. If symptoms are not adequate controlled with black cohosh, recommend follow-up with gynecology.       Other Visit Diagnoses    At risk for colon cancer       Relevant Orders   Ambulatory referral to Gastroenterology       I am having Ms. Brue maintain her pantoprazole, mirtazapine, D3-50, clonazePAM, ibuprofen, pantoprazole, amLODipine, and losartan-hydrochlorothiazide.   Follow-up: Return in about 3 months (around 02/27/2017), or if symptoms worsen or fail to improve.  Jeanine Luzalone, Rommie Dunn, FNP

## 2016-11-29 NOTE — Assessment & Plan Note (Signed)
Notes menstrual changes with no mention of cycle in the past 3 months. Has began to experience hot flashes on occasion. Recommend black cohosh. Continue to monitor as appears menopause may be starting. If symptoms are not adequate controlled with black cohosh, recommend follow-up with gynecology.

## 2016-12-08 ENCOUNTER — Encounter: Payer: Self-pay | Admitting: Gastroenterology

## 2017-01-17 ENCOUNTER — Other Ambulatory Visit: Payer: Self-pay | Admitting: Women's Health

## 2017-01-17 DIAGNOSIS — N946 Dysmenorrhea, unspecified: Secondary | ICD-10-CM

## 2017-01-20 ENCOUNTER — Encounter: Payer: Self-pay | Admitting: Adult Health

## 2017-01-20 ENCOUNTER — Ambulatory Visit (INDEPENDENT_AMBULATORY_CARE_PROVIDER_SITE_OTHER): Payer: BLUE CROSS/BLUE SHIELD | Admitting: Adult Health

## 2017-01-20 VITALS — BP 148/90 | Temp 98.9°F | Ht 67.0 in | Wt 276.2 lb

## 2017-01-20 DIAGNOSIS — R6889 Other general symptoms and signs: Secondary | ICD-10-CM

## 2017-01-20 LAB — POCT INFLUENZA A/B
Influenza A, POC: NEGATIVE
Influenza B, POC: NEGATIVE

## 2017-01-20 MED ORDER — OSELTAMIVIR PHOSPHATE 75 MG PO CAPS: 75.0000 mg | ORAL_CAPSULE | Freq: Two times a day (BID) | ORAL | 0 refills | Status: DC

## 2017-01-20 NOTE — Patient Instructions (Signed)
It was great meeting you today.   The flu swab came back negative.   I have sent in a prescription for Tamiflu to treat you despite the test results.   Stay hydrated and rest

## 2017-01-20 NOTE — Progress Notes (Signed)
Subjective:    Patient ID: Courtney Bates, female    DOB: [birth year], 51 y.o.   MRN: [id number]  HPI  51 year old female who  has a past medical history of Allergy; Depression; Family history of thyroid disease; Frequent headaches; GERD (gastroesophageal reflux disease); Hypertension; Hypertensive heart disease; and Morbid obesity (HCC). She presents to the office today for flu like symptoms. She reports that her symptoms started less than 24 hours ago. Her symptoms include that of appetite change, fatigue, fevers up to 101, generalized body aches, chills, and sweating.   She denies productive cough, n/v/d/    Review of Systems  Constitutional: Positive for activity change, appetite change, chills, diaphoresis, fatigue and fever. Negative for unexpected weight change.  HENT: Negative.   Respiratory: Positive for cough. Negative for shortness of breath and wheezing.   Cardiovascular: Negative.   Musculoskeletal: Positive for myalgias.   Past Medical History:  Diagnosis Date  . Allergy   . Depression   . Family history of thyroid disease   . Frequent headaches   . GERD (gastroesophageal reflux disease)   . Hypertension   . Hypertensive heart disease    ECHO 01/30/14 with concentric LVH  EF 60% and   . Morbid obesity (HCC)     Social History   Social History  . Marital status: Single    Spouse name: N/A  . Number of children: 2  . Years of education: 14   Occupational History  . Sewer    Social History Main Topics  . Smoking status: Never Smoker  . Smokeless tobacco: Never Used  . Alcohol use Yes     Comment: occasionally  . Drug use: No  . Sexual activity: Yes    Birth control/ protection: Condom   Other Topics Concern  . 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  Family History  Problem Relation Age of Onset  . Hypertension Mother   . Arthritis Mother   . Other Father     brain tumor  . Heart disease Maternal Grandfather   . Heart disease Paternal Grandmother   . Heart disease Paternal Grandfather     Allergies  Allergen Reactions  . Penicillins     Current Outpatient Prescriptions on File Prior to Visit  Medication Sig Dispense Refill  . amLODipine (NORVASC) 10 MG tablet TAKE 1 TABLET EVERY DAY 90 tablet 0  . clonazePAM (KLONOPIN) 0.5 MG tablet Take 1 tablet (0.5 mg total) by mouth 2 (two) times daily as needed for anxiety. 60 tablet 0  . D3-50 50000 units capsule TAKE 1 CAPSULE (50,000 UNITS TOTAL) BY MOUTH ONCE A WEEK. 6 capsule 0  . ibuprofen (ADVIL,MOTRIN) 600 MG tablet TAKE 1 TABLET BY MOUTH EVERY 8 HOURS AS NEEDED 60 tablet 1  . losartan-hydrochlorothiazide (HYZAAR) 100-25 MG tablet TAKE 1 TABLET BY MOUTH DAILY. 90 tablet 0  . mirtazapine (REMERON SOL-TAB) 15 MG disintegrating tablet TAKE 1 TABLET BY MOUTH AT BEDTIME. 30 tablet 0  . pantoprazole (PROTONIX) 40 MG tablet Take 1 tablet (40 mg total) by mouth daily. Appointment needed for further refills. 90 tablet 0   No current facility-administered medications on file prior to visit.     BP (!) 148/90  Temp 98.9 F (37.2 C) (Oral)   Ht 5\' 7"  (1.702 m)   Wt 276 lb 3.2 oz (125.3 kg)   BMI 43.26 kg/m       Objective:   Physical Exam  Constitutional: She is oriented to person, place, and time. She appears well-developed and well-nourished. She appears ill.  HENT:  Head: Normocephalic and atraumatic.  Right Ear: External ear normal.  Left Ear: External ear normal.  Nose: Nose normal.  Mouth/Throat: Oropharynx is clear and moist. No oropharyngeal exudate.  Eyes: Conjunctivae and EOM are normal. Pupils are equal, round, and reactive to light. Right eye exhibits no discharge. Left eye exhibits no discharge. No scleral icterus.  Neck: Normal range of motion. Neck  supple. No thyromegaly present.  Cardiovascular: Normal rate, regular rhythm, normal heart sounds and intact distal pulses.  Exam reveals no gallop and no friction rub.   No murmur heard. Pulmonary/Chest: Effort normal and breath sounds normal. No respiratory distress. She has no wheezes. She has no rales. She exhibits no tenderness.  Lymphadenopathy:    She has no cervical adenopathy.  Neurological: She is alert and oriented to person, place, and time.  Skin: Skin is warm. No rash noted. She is diaphoretic. No erythema. No pallor.  Psychiatric: She has a normal mood and affect. Her behavior is normal. Judgment and thought content normal.  Nursing note and vitals reviewed.     Assessment & Plan:  1. Flu-like symptoms - oseltamivir (TAMIFLU) 75 MG capsule; Take 1 capsule (75 mg total) by mouth 2 (two) times daily.  Dispense: 10 capsule; Refill: 0 - POC Influenza A/B- negative  - Stay hydrated and rest  - Tylenol/motrin for symptom relief.  - Follow up as needed  Shirline Frees, NP

## 2017-01-24 ENCOUNTER — Ambulatory Visit (AMBULATORY_SURGERY_CENTER): Payer: Self-pay

## 2017-01-24 VITALS — Ht 67.0 in | Wt 277.0 lb

## 2017-01-24 DIAGNOSIS — Z1211 Encounter for screening for malignant neoplasm of colon: Secondary | ICD-10-CM

## 2017-01-24 MED ORDER — SUPREP BOWEL PREP KIT 17.5-3.13-1.6 GM/177ML PO SOLN: 1.0000 | Freq: Once | ORAL | 0 refills | Status: AC

## 2017-01-24 NOTE — Progress Notes (Signed)
No allergies to eggs or soy No past problems with anesthesia No home oxygen No diet meds  Registered for emmi  

## 2017-01-25 ENCOUNTER — Encounter: Payer: Self-pay | Admitting: Gastroenterology

## 2017-02-07 ENCOUNTER — Encounter: Payer: Self-pay | Admitting: Gastroenterology

## 2017-02-07 ENCOUNTER — Ambulatory Visit (AMBULATORY_SURGERY_CENTER): Payer: BLUE CROSS/BLUE SHIELD | Admitting: Gastroenterology

## 2017-02-07 VITALS — BP 99/71 | HR 84 | Temp 98.4°F | Resp 19 | Ht 67.0 in | Wt 277.0 lb

## 2017-02-07 DIAGNOSIS — Z1212 Encounter for screening for malignant neoplasm of rectum: Secondary | ICD-10-CM

## 2017-02-07 DIAGNOSIS — Z1211 Encounter for screening for malignant neoplasm of colon: Secondary | ICD-10-CM | POA: Diagnosis not present

## 2017-02-07 MED ORDER — SODIUM CHLORIDE 0.9 % IV SOLN: 500.0000 mL | INTRAVENOUS | Status: DC

## 2017-02-07 NOTE — Progress Notes (Signed)
To PACU, vss patent aw report to rn 

## 2017-02-07 NOTE — Op Note (Signed)
[facility] Patient Name: Courtney Bates Procedure Date: 02/07/2017 1:19 PM MRN: [id number] Endoscopist: Viviann Spare P. Armbruster MD, MD Age: 51 Referring MD:  Date of Birth: 07/06/1966 Gender: Female Account #: [id number] Procedure:                Colonoscopy Indications:              Screening for colorectal malignant neoplasm, This                            is the patient's first colonoscopy Medicines:                Monitored Anesthesia Care Procedure:                Pre-Anesthesia Assessment:                           - Prior to the procedure, a History and Physical                            was performed, and patient medications and                            allergies were reviewed. The patient's tolerance of                            previous anesthesia was also reviewed. The risks                            and benefits of the procedure and the sedation                            options and risks were discussed with the patient.                            All questions were answered, and informed consent                            was obtained. Prior Anticoagulants: The patient has                            taken no previous anticoagulant or antiplatelet                            agents. ASA Grade Assessment: III - A patient with                            severe systemic disease. After reviewing the risks                            and benefits, the patient was deemed in                            satisfactory condition to undergo the procedure.  After obtaining informed consent, the colonoscope                            was passed under direct vision. Throughout the                            procedure, the patient's blood pressure, pulse, and                            oxygen saturations were monitored continuously. The                            Model CF-HQ190L [id number]) scope was introduced                            through the  anus and advanced to the the cecum,                            identified by appendiceal orifice and ileocecal                            valve. The colonoscopy was performed without                            difficulty. The patient tolerated the procedure                            well. The quality of the bowel preparation was                            good. The ileocecal valve, appendiceal orifice, and                            rectum were photographed. Scope In: 1:31:10 PM Scope Out: 1:45:05 PM Scope Withdrawal Time: 0 hours 11 minutes 46 seconds  Total Procedure Duration: 0 hours 13 minutes 55 seconds  Findings:                 The perianal and digital rectal examinations were                            normal.                           Internal hemorrhoids were found during                            retroflexion. The hemorrhoids were small.                           The exam was otherwise without abnormality. No                            polyps. Complications:            No immediate complications. Estimated blood loss:  None. Estimated Blood Loss:     Estimated blood loss: none. Impression:               - Internal hemorrhoids.                           - The examination was otherwise normal. No polyps                           - No specimens collected. Recommendation:           - Patient has a contact number available for                            emergencies. The signs and symptoms of potential                            delayed complications were discussed with the                            patient. Return to normal activities tomorrow.                            Written discharge instructions were provided to the                            patient.                           - Resume previous diet.                           - Continue present medications.                           - Repeat colonoscopy in 10 years for screening                             purposes. Viviann SpareSteven P. Armbruster MD, MD 02/07/2017 1:47:51 PM This report has been signed electronically.

## 2017-02-07 NOTE — Patient Instructions (Signed)
YOU HAD AN ENDOSCOPIC PROCEDURE TODAY AT THE [facility]:   Refer to the procedure report that was given to you for any specific questions about what was found during the examination.  If the procedure report does not answer your questions, please call your gastroenterologist to clarify.  If you requested that your care partner not be given the details of your procedure findings, then the procedure report has been included in a sealed envelope for you to review at your convenience later.  YOU SHOULD EXPECT: Some feelings of bloating in the abdomen. Passage of more gas than usual.  Walking can help get rid of the air that was put into your GI tract during the procedure and reduce the bloating. If you had a lower endoscopy (such as a colonoscopy or flexible sigmoidoscopy) you may notice spotting of blood in your stool or on the toilet paper. If you underwent a bowel prep for your procedure, you may not have a normal bowel movement for a few days.  Please Note:  You might notice some irritation and congestion in your nose or some drainage.  This is from the oxygen used during your procedure.  There is no need for concern and it should clear up in a day or so.  SYMPTOMS TO REPORT IMMEDIATELY:   Following lower endoscopy (colonoscopy or flexible sigmoidoscopy):  Excessive amounts of blood in the stool  Significant tenderness or worsening of abdominal pains  Swelling of the abdomen that is new, acute  Fever of 100F or higher    For urgent or emergent issues, a gastroenterologist can be reached at any hour by calling [phone].   DIET:  We do recommend a small meal at first, but then you may proceed to your regular diet.  Drink plenty of fluids but you should avoid alcoholic beverages for 24 hours.  ACTIVITY:  You should plan to take it easy for the rest of today and you should NOT DRIVE or use heavy machinery until tomorrow (because of the sedation medicines used during the test).     FOLLOW UP: Our staff will call the number listed on your records the next business day following your procedure to check on you and address any questions or concerns that you may have regarding the information given to you following your procedure. If we do not reach you, we will leave a message.  However, if you are feeling well and you are not experiencing any problems, there is no need to return our call.  We will assume that you have returned to your regular daily activities without incident.  If any biopsies were taken you will be contacted by phone or by letter within the next 1-3 weeks.  Please call us at [phone] if you have not heard about the biopsies in 3 weeks.    SIGNATURES/CONFIDENTIALITY: You and/or your care partner have signed paperwork which will be entered into your electronic medical record.  These signatures attest to the fact that that the information above on your After Visit Summary has been reviewed and is understood.  Full responsibility of the confidentiality of this discharge information lies with you and/or your care-partner.   Resume medications.Information given on hemorrhoids and high fiber diet. 

## 2017-02-07 NOTE — Progress Notes (Signed)
Pt's states no medical or surgical changes since previsit or office visit. maw 

## 2017-02-08 ENCOUNTER — Telehealth: Payer: Self-pay | Admitting: *Deleted

## 2017-02-08 NOTE — Telephone Encounter (Signed)
  Follow up Call-  Call back number 02/07/2017  Post procedure Call Back phone  # #[phone] cell  Permission to leave phone message Yes  Some recent data might be hidden     Patient questions:  Do you have a fever, pain , or abdominal swelling? No. Pain Score  0 *  Have you tolerated food without any problems? Yes.    Have you been able to return to your normal activities? Yes.    Do you have any questions about your discharge instructions: Diet   No. Medications  No. Follow up visit  No.  Do you have questions or concerns about your Care? No.  Actions: * If pain score is 4 or above: No action needed, pain <4.

## 2017-03-07 ENCOUNTER — Other Ambulatory Visit: Payer: Self-pay | Admitting: Family

## 2017-03-07 DIAGNOSIS — K219 Gastro-esophageal reflux disease without esophagitis: Secondary | ICD-10-CM

## 2017-03-08 NOTE — Telephone Encounter (Signed)
Faxed script back to [facility]!

## 2017-03-29 ENCOUNTER — Other Ambulatory Visit: Payer: Self-pay | Admitting: Family

## 2017-03-29 DIAGNOSIS — I119 Hypertensive heart disease without heart failure: Secondary | ICD-10-CM

## 2017-04-27 ENCOUNTER — Encounter: Payer: Self-pay | Admitting: Gynecology

## 2017-05-05 ENCOUNTER — Ambulatory Visit: Payer: BLUE CROSS/BLUE SHIELD | Admitting: Family

## 2017-05-10 ENCOUNTER — Encounter: Payer: Self-pay | Admitting: Family

## 2017-05-10 ENCOUNTER — Ambulatory Visit (INDEPENDENT_AMBULATORY_CARE_PROVIDER_SITE_OTHER): Payer: BLUE CROSS/BLUE SHIELD | Admitting: Family

## 2017-05-10 VITALS — BP 132/82 | Temp 98.3°F | Resp 16 | Ht 67.0 in | Wt 286.0 lb

## 2017-05-10 DIAGNOSIS — M25561 Pain in right knee: Secondary | ICD-10-CM | POA: Insufficient documentation

## 2017-05-10 DIAGNOSIS — M7651 Patellar tendinitis, right knee: Secondary | ICD-10-CM | POA: Diagnosis not present

## 2017-05-10 DIAGNOSIS — M25562 Pain in left knee: Secondary | ICD-10-CM | POA: Insufficient documentation

## 2017-05-10 DIAGNOSIS — M7652 Patellar tendinitis, left knee: Secondary | ICD-10-CM | POA: Diagnosis not present

## 2017-05-10 DIAGNOSIS — G8929 Other chronic pain: Secondary | ICD-10-CM | POA: Insufficient documentation

## 2017-05-10 MED ORDER — DICLOFENAC SODIUM 2 % TD SOLN: 1.0000 "application " | Freq: Two times a day (BID) | TRANSDERMAL | 1 refills | Status: DC | PRN

## 2017-05-10 MED ORDER — IBUPROFEN-FAMOTIDINE 800-26.6 MG PO TABS: 1.0000 | ORAL_TABLET | Freq: Three times a day (TID) | ORAL | 1 refills | Status: DC | PRN

## 2017-05-10 NOTE — Assessment & Plan Note (Signed)
New onset bilateral knee pain consistent with patellar tendinitis most likely related to poor biomechanics and increased weight. Treat conservatively with icing regimen, home exercise therapy, and patellar tendon straps. Start Duexis and Pennsaid. Follow-up in 3 weeks or sooner if needed.

## 2017-05-10 NOTE — Progress Notes (Signed)
Subjective:    Patient ID: Courtney Bates, female    DOB: 07/01/1966, 51 y.o.   MRN: [id number]  Chief Complaint  Patient presents with  . Knee Pain    right knee pain, sometimes gets a numb feeling in the knee, states that both knees are bothering her x4 months    HPI:  Courtney Bates is a 51 y.o. female who  has a past medical history of Allergy; Anemia; Depression; Family history of thyroid disease; Frequent headaches; GERD (gastroesophageal reflux disease); Hypertension; Hypertensive heart disease; and Morbid obesity (HCC). and presents today for an office visit.  This is a new problem. Associated symptom of pain located in her bilateral knees that has been going on for about 4 months. Pain is described as achy and aggravated with standing for longer than 10 minutes at a time. Endorses that she has numbness in her right thigh has been going on for about the same amount of time. Modifying factors include OTC medications, soaking, rubs, and creams which has not helped very much. No trauma or injury to either knee. Does have mild amount of back pain. Has popping sensations on occasion without locking, catching, giving way. Severity is enough to effect her ability to walk well and exercise.   Allergies  Allergen Reactions  . Penicillins Rash      Outpatient Medications Prior to Visit  Medication Sig Dispense Refill  . amLODipine (NORVASC) 10 MG tablet TAKE 1 TABLET EVERY DAY 90 tablet 0  . clonazePAM (KLONOPIN) 0.5 MG tablet TAKE 1 TABLET BY MOUTH TWICE A DAY AS NEEDED FOR ANXIETY 60 tablet 2  . ibuprofen (ADVIL,MOTRIN) 600 MG tablet TAKE 1 TABLET BY MOUTH EVERY 8 HOURS AS NEEDED 60 tablet 1  . losartan-hydrochlorothiazide (HYZAAR) 100-25 MG tablet TAKE 1 TABLET BY MOUTH DAILY. 90 tablet 0  . Omega-3 Fatty Acids (FISH OIL) 1000 MG CAPS Take by mouth.    . pantoprazole (PROTONIX) 40 MG tablet TAKE 1 TABLET (40 MG TOTAL) BY MOUTH DAILY. APPOINTMENT NEEDED FOR FURTHER REFILLS. 90  tablet 0   Facility-Administered Medications Prior to Visit  Medication Dose Route Frequency Provider Last Rate Last Dose  . 0.9 %  sodium chloride infusion  500 mL Intravenous Continuous Armbruster, Reeves ForthSteven Paul, MD          Past Surgical History:  Procedure Laterality Date  . ganglion cyst  2005   right wrist  . WISDOM TOOTH EXTRACTION        Past Medical History:  Diagnosis Date  . Allergy   . Anemia   . Depression   . Family history of thyroid disease   . Frequent headaches   . GERD (gastroesophageal reflux disease)   . Hypertension   . Hypertensive heart disease    ECHO 01/30/14 with concentric LVH  EF 60% and   . Morbid obesity (HCC)       Review of Systems  Constitutional: Negative for chills and fever.  Musculoskeletal:       Positive for bilateral anterior knee pain.   Neurological: Negative for weakness and numbness.      Objective:    BP 132/82 (BP Location: Left Arm, Patient Position: Sitting, Cuff Size: Large)   Temp 98.3 F (36.8 C) (Oral)   Resp 16   Ht 5\' 7"  (1.702 m)   Wt 286 lb (129.7 kg)   BMI 44.79 kg/m  Nursing note and vital signs reviewed.  Physical Exam  Constitutional: She is oriented to person,  place, and time. She appears well-developed and well-nourished. No distress.  Cardiovascular: Normal rate, regular rhythm, normal heart sounds and intact distal pulses.   Pulmonary/Chest: Effort normal and breath sounds normal.  Musculoskeletal:  Bilateral knees - no obvious deformity, discoloration, or edema. Palpable tenderness of the patellar tendon and distal patella bilaterally. No other tenderness able to be elicited. Range of motion within normal limits. Passive range of motion with decreased pain compared to active range of motion with increased pain. Ligamentous and meniscal testing is negative. Strength is normal. Distal pulses and sensation are intact and appropriate.  Neurological: She is alert and oriented to person, place, and  time.  Skin: Skin is warm and dry.  Psychiatric: She has a normal mood and affect. Her behavior is normal. Judgment and thought content normal.       Assessment & Plan:   Problem List Items Addressed This Visit      Musculoskeletal and Integument   Patellar tendinitis of both knees - Primary    New onset bilateral knee pain consistent with patellar tendinitis most likely related to poor biomechanics and increased weight. Treat conservatively with icing regimen, home exercise therapy, and patellar tendon straps. Start Duexis and Pennsaid. Follow-up in 3 weeks or sooner if needed.          I am having Ms. Bann start on Ibuprofen-Famotidine and Diclofenac Sodium. I am also having her maintain her ibuprofen, Fish Oil, pantoprazole, clonazePAM, amLODipine, and losartan-hydrochlorothiazide. We will continue to administer sodium chloride.   Meds ordered this encounter  Medications  . Ibuprofen-Famotidine 800-26.6 MG TABS    Sig: Take 1 tablet by mouth 3 (three) times daily as needed.    Dispense:  90 tablet    Refill:  1    Order Specific Question:   Supervising Provider    Answer:   Hillard Danker A [[id number]]  . Diclofenac Sodium (PENNSAID) 2 % SOLN    Sig: Place 1 application onto the skin 2 (two) times daily as needed.    Dispense:  112 g    Refill:  1    Order Specific Question:   Supervising Provider    Answer:   Hillard Danker A [[id number]]     Follow-up: Return in about 3 weeks (around 05/31/2017), or if symptoms worsen or fail to improve.  Jeanine Luz, FNP

## 2017-05-10 NOTE — Patient Instructions (Signed)
Thank you for choosing [facility]!  Ice x 20 minutes every 2 hours and as needed or following activity  Exercises 1-2 times per day as instructed.   Knee strap as needed   Medications  Pennsaid - Approximately 1/2 packet to the affected site twice daily.  Duexis - 1 tablet 3 times per day for the next 5-7 days and then as needed.  You will receive a call from [facility] regarding your Pennsaid/Duexis/Vimovo. The medication will be mailed to you and should cost you no more than $10 per item or possibly free depending upon your insurance.   Your prescription(s) have been submitted to your pharmacy or been printed and provided for you. Please take as directed and contact our office if you believe you are having problem(s) with the medication(s) or have any questions.  If your symptoms worsen or fail to improve, please contact our office for further instruction, or in case of emergency go directly to the emergency room at the closest medical facility.    Patellar Tendinitis Rehab Ask your health care provider which exercises are safe for you. Do exercises exactly as told by your health care provider and adjust them as directed. It is normal to feel mild stretching, pulling, tightness, or discomfort as you do these exercises, but you should stop right away if you feel sudden pain or your pain gets worse.Do not begin these exercises until told by your health care provider. Stretching and range of motion exercises This exercise warms up your muscles and joints and improves the movement and flexibility of your knee. This exercise also helps to relieve pain and stiffness. Exercise A: Hamstring, doorway   1. Lie on your back in front of a doorway with your __________ leg resting against the wall and your other leg flat on the floor in the doorway. There should be a slight bend in your __________ knee. 2. Straighten your __________ knee. You should feel a stretch behind your knee or  thigh. If you do not, scoot your buttocks closer to the door. 3. Hold this position for __________ seconds. Repeat __________ times. Complete this stretch __________ times a day. Strengthening exercises These exercises build strength and endurance in your knee. Endurance is the ability to use your muscles for a long time, even after they get tired. Exercise B: Quadriceps, isometric   1. Lie on your back with your __________ leg extended and your other knee bent. 2. Slowly tense the muscles in the front of your __________ thigh. When you do this, you should see your kneecap slide up toward your hip or see increased dimpling just above the knee. This motion will push the back of your knee toward the floor. If this is painful, try putting a rolled-up hand towel under your knee to support it in a bent position. Change the size of the towel to find a position that allows you to do this exercise without any pain. 3. For __________ seconds, hold the muscle as tight as you can without increasing your pain. 4. Relax the muscles slowly and completely. Repeat __________ times. Complete this exercise __________ times a day. Exercise C: Straight leg raises (  quadriceps) 1. Lie on your back with your __________ leg extended and your other knee bent. 2. Tense the muscles in the front of your __________ thigh. When you do this, you should see your kneecap slide up or see increased dimpling just above the knee. 3. Keep these muscles tight as you raise your  leg 4-6 inches (10-15 cm) off the floor. Do not let your moving knee bend. 4. Hold this position for __________ seconds. 5. Keep these muscles tense as you slowly lower your leg. 6. Relax your muscles slowly and completely. Repeat __________ times. Complete this exercise __________ times a day. Exercise D: Squats 1. Stand in front of a table, with your feet and knees pointing straight ahead. You may rest your hands on the table for balance but not for  support. 2. Slowly bend your knees and lower your hips like you are going to sit in a chair.  Keep your weight over your heels, not over your toes.  Keep your lower legs upright so they are parallel with the table legs.  Do not let your hips go lower than your knees.  Do not bend lower than told by your health care provider.  If your knee pain increases, do not bend as low. 3. Hold the squat position for __________ seconds. 4. Slowly push with your legs to return to standing. Do not use your hands to pull yourself to standing. Repeat __________ times. Complete this exercise __________ times a day. Exercise E: Step-downs 1. Stand on the edge of a step. 2. Keeping your weight over your __________ heel, slowly bend your __________ knee to bring your __________ heel toward the floor. Lower your heel as far as you can while keeping control and without increasing any discomfort.  Do not let your __________ knee come forward.  Use your leg muscles, not gravity, to lower your body.  Hold a wall or rail for balance if needed. 3. Slowly push through your heel to lift your body weight back up. 4. Return to the starting position. Repeat __________ times. Complete this exercise __________ times a day. Exercise F: Straight leg raises ( hip abductors) 1. Lie on your side with your __________ leg in the top position. Lie so your head, shoulder, knee, and hip line up. You may bend your lower knee to help you keep your balance. 2. Roll your hips slightly forward, so that your hips are stacked directly over each other and your __________ knee is facing forward. 3. Leading with your heel, lift your top leg 4-6 inches (10-15 cm). You should feel the muscles in your outer hip lifting.  Do not let your foot drift forward.  Do not let your knee roll toward the ceiling. 4. Hold this position for __________ seconds. 5. Slowly lower your leg to the starting position. 6. Let your muscles relax completely  after each repetition. Repeat __________ times. Complete this exercise __________ times a day. This information is not intended to replace advice given to you by your health care provider. Make sure you discuss any questions you have with your health care provider. Document Released: 11/29/2005 Document Revised: 08/05/2016 Document Reviewed: 09/02/2015 Elsevier Interactive Patient Education  2017 [employer]

## 2017-06-18 ENCOUNTER — Other Ambulatory Visit: Payer: Self-pay | Admitting: Family

## 2017-07-15 ENCOUNTER — Other Ambulatory Visit: Payer: Self-pay | Admitting: Family

## 2017-07-15 DIAGNOSIS — I119 Hypertensive heart disease without heart failure: Secondary | ICD-10-CM

## 2017-09-14 ENCOUNTER — Other Ambulatory Visit: Payer: Self-pay | Admitting: Family

## 2017-09-27 ENCOUNTER — Encounter: Payer: BLUE CROSS/BLUE SHIELD | Admitting: Women's Health

## 2017-10-07 DIAGNOSIS — Z23 Encounter for immunization: Secondary | ICD-10-CM | POA: Diagnosis not present

## 2017-10-07 DIAGNOSIS — Z1231 Encounter for screening mammogram for malignant neoplasm of breast: Secondary | ICD-10-CM | POA: Diagnosis not present

## 2017-10-07 DIAGNOSIS — Z713 Dietary counseling and surveillance: Secondary | ICD-10-CM | POA: Diagnosis not present

## 2017-10-07 LAB — HM MAMMOGRAPHY

## 2017-10-21 DIAGNOSIS — Z713 Dietary counseling and surveillance: Secondary | ICD-10-CM | POA: Diagnosis not present

## 2017-11-11 DIAGNOSIS — Z713 Dietary counseling and surveillance: Secondary | ICD-10-CM | POA: Diagnosis not present

## 2017-11-18 ENCOUNTER — Other Ambulatory Visit: Payer: Self-pay | Admitting: Family

## 2017-11-18 DIAGNOSIS — I119 Hypertensive heart disease without heart failure: Secondary | ICD-10-CM

## 2017-11-25 DIAGNOSIS — Z713 Dietary counseling and surveillance: Secondary | ICD-10-CM | POA: Diagnosis not present

## 2017-11-29 DIAGNOSIS — Z713 Dietary counseling and surveillance: Secondary | ICD-10-CM | POA: Diagnosis not present

## 2017-12-09 ENCOUNTER — Other Ambulatory Visit: Payer: Self-pay | Admitting: Family

## 2017-12-09 ENCOUNTER — Encounter: Payer: Self-pay | Admitting: Family

## 2017-12-09 DIAGNOSIS — I119 Hypertensive heart disease without heart failure: Secondary | ICD-10-CM

## 2017-12-16 ENCOUNTER — Encounter: Payer: Self-pay | Admitting: Nurse Practitioner

## 2017-12-16 ENCOUNTER — Other Ambulatory Visit (INDEPENDENT_AMBULATORY_CARE_PROVIDER_SITE_OTHER): Payer: BLUE CROSS/BLUE SHIELD

## 2017-12-16 ENCOUNTER — Ambulatory Visit: Payer: BLUE CROSS/BLUE SHIELD | Admitting: Nurse Practitioner

## 2017-12-16 ENCOUNTER — Ambulatory Visit (INDEPENDENT_AMBULATORY_CARE_PROVIDER_SITE_OTHER)
Admission: RE | Admit: 2017-12-16 | Discharge: 2017-12-16 | Disposition: A | Discharge status: Home / Self Care | Payer: BLUE CROSS/BLUE SHIELD | Source: Ambulatory Visit | Attending: Nurse Practitioner | Admitting: Nurse Practitioner

## 2017-12-16 VITALS — BP 146/96 | Temp 98.3°F | Resp 16 | Ht 67.0 in | Wt 286.0 lb

## 2017-12-16 DIAGNOSIS — R002 Palpitations: Secondary | ICD-10-CM

## 2017-12-16 DIAGNOSIS — I119 Hypertensive heart disease without heart failure: Secondary | ICD-10-CM

## 2017-12-16 DIAGNOSIS — F419 Anxiety disorder, unspecified: Secondary | ICD-10-CM | POA: Diagnosis not present

## 2017-12-16 DIAGNOSIS — M179 Osteoarthritis of knee, unspecified: Secondary | ICD-10-CM | POA: Diagnosis not present

## 2017-12-16 DIAGNOSIS — G8929 Other chronic pain: Secondary | ICD-10-CM | POA: Diagnosis not present

## 2017-12-16 DIAGNOSIS — M25561 Pain in right knee: Secondary | ICD-10-CM | POA: Diagnosis not present

## 2017-12-16 DIAGNOSIS — M25562 Pain in left knee: Secondary | ICD-10-CM

## 2017-12-16 DIAGNOSIS — F329 Major depressive disorder, single episode, unspecified: Secondary | ICD-10-CM

## 2017-12-16 DIAGNOSIS — I10 Essential (primary) hypertension: Secondary | ICD-10-CM | POA: Diagnosis not present

## 2017-12-16 DIAGNOSIS — F32A Depression, unspecified: Secondary | ICD-10-CM

## 2017-12-16 DIAGNOSIS — K219 Gastro-esophageal reflux disease without esophagitis: Secondary | ICD-10-CM | POA: Diagnosis not present

## 2017-12-16 LAB — TSH: TSH: 1.34 u[IU]/mL (ref 0.35–4.50)

## 2017-12-16 LAB — COMPREHENSIVE METABOLIC PANEL
ALT: 7 U/L (ref 0–35)
AST: 12 U/L (ref 0–37)
Albumin: 4.4 g/dL (ref 3.5–5.2)
Alkaline Phosphatase: 82 U/L (ref 39–117)
BUN: 14 mg/dL (ref 6–23)
CHLORIDE: 99 meq/L (ref 96–112)
CO2: 31 mEq/L (ref 19–32)
Calcium: 9.7 mg/dL (ref 8.4–10.5)
Creatinine, Ser: 1.1 mg/dL (ref 0.40–1.20)
GFR: 67.34 mL/min (ref >60.00)
GLUCOSE: 97 mg/dL (ref 70–99)
POTASSIUM: 3.3 meq/L — AB (ref 3.5–5.1)
SODIUM: 140 meq/L (ref 135–145)
Total Bilirubin: 0.5 mg/dL (ref 0.2–1.2)
Total Protein: 7.4 g/dL (ref 6.0–8.3)

## 2017-12-16 LAB — CBC
HEMATOCRIT: 40.9 % (ref 36.0–46.0)
HEMOGLOBIN: 13.5 g/dL (ref 12.0–15.0)
MCHC: 33 g/dL (ref 30.0–36.0)
MCV: 80.6 fl (ref 78.0–100.0)
Platelets: 388 10*3/uL (ref 150.0–400.0)
RBC: 5.08 Mil/uL (ref 3.87–5.11)
RDW: 13.9 % (ref 11.5–15.5)
WBC: 9.6 10*3/uL (ref 4.0–10.5)

## 2017-12-16 MED ORDER — LOSARTAN POTASSIUM-HCTZ 100-25 MG PO TABS: 1.0000 | ORAL_TABLET | Freq: Every day | ORAL | 0 refills | Status: DC

## 2017-12-16 MED ORDER — PANTOPRAZOLE SODIUM 40 MG PO TBEC: 40.0000 mg | DELAYED_RELEASE_TABLET | Freq: Every day | ORAL | 0 refills | Status: DC

## 2017-12-16 MED ORDER — AMLODIPINE BESYLATE 10 MG PO TABS: 10.0000 mg | ORAL_TABLET | Freq: Every day | ORAL | 0 refills | Status: DC

## 2017-12-16 MED ORDER — SERTRALINE HCL 50 MG PO TABS: 50.0000 mg | ORAL_TABLET | Freq: Every day | ORAL | 3 refills | Status: DC

## 2017-12-16 NOTE — Progress Notes (Addendum)
Subjective:    Patient ID: Courtney Bates, female    DOB: 1966-09-07, 52 y.o.   MRN: [id number]  HPI Ms Courtney Bates presents today to establish care. She Is transferring to me from another provider in the same clinic. She is here today requesting evaluation of knee pain, anxiety and hypertension.  Knee pain- the pain is in both knees. The pain is chronic The pain began over a year ago and has been getting worse over the past year. She notices the pain when waking in the morning and at the end of the day. She reports numb feeling in her right knee at times The pain radiates into her right thigh and into both feet. The pain prevents her from walking long distances and exercising. She was given a pain cream and ibuprofen by prior provider which did not seem to help the pain She denies weakness, falls, swelling, discoloration  Anxiety-this is a chronic problem. The problem began about 2 years ago and has gotten worse over the past few months. She reports no energy, feeling stressed frequently, crying a lot. A couple of days recently she had thoughts that she would be better off if she was not here, but denies any actual plan to hurt herself or others.  She was taking klonopin prn which seemed to help at first but after about 6 months noticed no relief of her symptoms. She ran out of klonopin about 6 months ago.  GAD 7 : Generalized Anxiety Score 12/16/2017  Nervous, Anxious, on Edge 2  Control/stop worrying 3  Worry too much - different things 3  Trouble relaxing 3  Restless 1  Easily annoyed or irritable 2  Afraid - awful might happen 3  Total GAD 7 Score 17    Depression screen PHQ 2/9 12/16/2017  Decreased Interest 3  Down, Depressed, Hopeless 2  PHQ - 2 Score 5  Altered sleeping 3  Tired, decreased energy 3  Change in appetite 1  Feeling bad or failure about yourself  2  Trouble concentrating 1  Moving slowly or fidgety/restless 1  Suicidal thoughts 1  PHQ-9 Score 17     Hypertension -maintained on amlodipine 10, hyzaar 100-25  Reports she normally takes her medications daily, but she has been taking them every other day for past month due to no refills. Reports she checks BP at home and readings mid 140s/upper 80s Denies headaches, vision changes, chest pain, shortness of breath. Reports intermittent LE edema.  BP Readings from Last 3 Encounters:  12/16/17 (!) 146/96  05/10/17 132/82  02/07/17 99/71   Palpitations- she reports intermittent palpitations. She is not having the palpitations today. The palpitations have been occurring for several years. The palpitations have not gotten worse over time. She denies weakness, dizziness, syncope. She would like a referral to cardiology - she had been seeing cardiology regularly in the past for palpitations, hypertension but stopped going to her follow up appointments and wants to go back.   Review of Systems  See HPI  Past Medical History:  Diagnosis Date  . Allergy   . Anemia   . Depression   . Family history of thyroid disease   . Frequent headaches   . GERD (gastroesophageal reflux disease)   . Hypertension   . Hypertensive heart disease    ECHO 01/30/14 with concentric LVH  EF 60% and   . Morbid obesity (HCC)      Social History   Socioeconomic History  . Marital status: Single  Spouse name: Not on file  . Number of children: 2  . Years of education: 57  . Highest education level: Not on file  Social Needs  . Financial resource strain: Not on file  . Food insecurity - worry: Not on file  . Food insecurity - inability: Not on file  . Transportation needs - medical: Not on file  . Transportation needs - non-medical: Not on file  Occupational History  . Occupation: Geographical information systems officer  Tobacco Use  . Smoking status: Never Smoker  . Smokeless tobacco: Never Used  Substance and Sexual Activity  . Alcohol use: Yes    Comment: once every 2-3 months  . Drug use: No  . Sexual activity: Yes     Birth control/protection: Condom  Other Topics Concern  . 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, works as a sewer, no exercise   Fun: Movies, parks, traveling   Denies religious beliefs effecting health   Feels safe at home and denies abuse    Past Surgical History:  Procedure Laterality Date  . ganglion cyst  2005   right wrist  . WISDOM TOOTH EXTRACTION      Family History  Problem Relation Age of Onset  . Hypertension Mother   . Arthritis Mother   . Other Father        brain tumor  . Heart disease Maternal Grandfather   . Heart disease Paternal Grandmother   . Heart disease Paternal Grandfather   . Colon cancer Other   . Esophageal cancer Neg Hx   . Pancreatic cancer Neg Hx   . Prostate cancer Neg Hx   . Rectal cancer Neg Hx   . Stomach cancer Neg Hx     Allergies  Allergen Reactions  . Penicillins Rash    Current Outpatient Medications on File Prior to Visit  Medication Sig Dispense Refill  . amLODipine (NORVASC) 10 MG tablet TAKE 1 TABLET EVERY DAY 90 tablet 0  . losartan-hydrochlorothiazide (HYZAAR) 100-25 MG tablet TAKE 1 TABLET BY MOUTH DAILY. 90 tablet 0  . Omega-3 Fatty Acids (FISH OIL) 1000 MG CAPS Take by mouth.    . pantoprazole (PROTONIX) 40 MG tablet TAKE 1 TABLET (40 MG TOTAL) BY MOUTH DAILY. APPOINTMENT NEEDED FOR FURTHER REFILLS. 90 tablet 0   Current Facility-Administered Medications on File Prior to Visit  Medication Dose Route Frequency Provider Last Rate Last Dose  . 0.9 %  sodium chloride infusion  500 mL Intravenous Continuous Armbruster, Willaim Rayas, MD        BP (!) 146/96 (BP Location: Left Arm, Patient Position: Sitting, Cuff Size: Large)   Temp 98.3 F (36.8 C) (Oral)   Resp 16   Ht 5\' 7"  (1.702 m)   Wt 286 lb (129.7 kg)   BMI 44.79 kg/m        Objective:   Physical Exam  Constitutional: She is oriented to person, place, and time. She appears well-developed and well-nourished. No distress.  HENT:    Head: Normocephalic and atraumatic.  Cardiovascular: Normal rate, regular rhythm, normal heart sounds and intact distal pulses.  Pulmonary/Chest: Effort normal and breath sounds normal.  Musculoskeletal: Normal range of motion. She exhibits no edema, tenderness or deformity.       Right knee: She exhibits normal range of motion and no deformity. No tenderness found.       Left knee: She exhibits normal range of motion and no deformity. No tenderness found.  Neurological: She is  alert and oriented to person, place, and time. Coordination normal.  Skin: Skin is warm and dry.  Psychiatric: She has a normal mood and affect. Her behavior is normal. Judgment normal. She expresses no suicidal plans and no homicidal plans.       Assessment & Plan:  RTC in 1 month for f/u of anxiety, depression and BP

## 2017-12-16 NOTE — Patient Instructions (Addendum)
Please head downstairs for lab work/x-rays.  I have placed a referral to cardiology and counseling. Our office will call you to schedule this appointment. You should hear from our office in 7-10 days.  For your anxiety,  I have sent a prescription for zoloft 50mg  tablets to your pharmacy. Please start 1/2 tablet once daily for 1 week and then increase to a full tablet once daily on week two as tolerated.  Some side effects such as nausea, drowsiness and weight gain can occur.  Also rarely people have experienced suicidal thoughts when taking this medication.  Please discontinue the medication and go directly to ED if this occurs.  Id like to see you back in about 1 month to evaluate progress.    For your blood pressure, please resume your amlodipine and hyzaar daily.  Our goal is to keep your blood pressure below 140/90. We will recheck your blood pressure at your 1 month follow up to see if it is back to normal.  For your knee pain, please schedule a follow up with Dr Katrinka BlazingSmith or Dr Jordan LikesSchmitz in our office.  It was good to meet you. Thanks for letting me take care of you today :)

## 2017-12-17 NOTE — Assessment & Plan Note (Addendum)
Has been skipping doses of medications due to no refills. Her blood pressure is elevated today but she does have past readings that are normal on her medications. Will resume meds and return in 1 month for follow up. BP goal given, she will continue to monitor at home. Will also place referral to cardiology for history of LVH, HTN and palpitation. Medications ordered: - amLODipine (NORVASC) 10 MG tablet; Take 1 tablet (10 mg total) by mouth daily.  Dispense: 90 tablet; Refill: 0 - losartan-hydrochlorothiazide (HYZAAR) 100-25 MG tablet; Take 1 tablet by mouth daily.  Dispense: 90 tablet; Refill: 0 Diagnostic testing ordered: - CBC; Future - Comprehensive metabolic panel; Future - Ambulatory referral to Cardiology

## 2017-12-18 ENCOUNTER — Encounter: Payer: Self-pay | Admitting: Nurse Practitioner

## 2017-12-18 ENCOUNTER — Other Ambulatory Visit: Payer: Self-pay | Admitting: Nurse Practitioner

## 2017-12-18 DIAGNOSIS — E876 Hypokalemia: Secondary | ICD-10-CM

## 2017-12-18 DIAGNOSIS — M1710 Unilateral primary osteoarthritis, unspecified knee: Secondary | ICD-10-CM | POA: Insufficient documentation

## 2017-12-18 DIAGNOSIS — R002 Palpitations: Secondary | ICD-10-CM | POA: Insufficient documentation

## 2017-12-18 DIAGNOSIS — M179 Osteoarthritis of knee, unspecified: Secondary | ICD-10-CM | POA: Insufficient documentation

## 2017-12-18 NOTE — Assessment & Plan Note (Signed)
Recommend f/u with sports med in our clinic. Diagnostic testing ordered: - DG Knee Complete 4 Views Left; Future - DG Knee Complete 4 Views Right; Future

## 2017-12-18 NOTE — Assessment & Plan Note (Signed)
We discussed treatment with SSRI and counseling and she agrees. We discussed side effects of SSRIs including suicidal thoughts, she was instructed to stop medication and go directly to ED if this occurs and she verablized understanding. She has had thoughts she would be better off dead, but promises that she never thought of a plan to hurt herself and that she would go directly to nearest ED if she did feel like she was going to hurt herself. Diagnostic testing ordered: - TSH; Future - CBC; Future - Comprehensive metabolic panel; Future Medications ordered: - sertraline (ZOLOFT) 50 MG tablet; Take 1 tablet (50 mg total) by mouth daily.  Dispense: 30 tablet; Refill: 3 Referrals ordered: - Ambulatory referral to Psychology

## 2017-12-18 NOTE — Progress Notes (Signed)
Lab order

## 2017-12-18 NOTE — Assessment & Plan Note (Signed)
Diagnostic testing ordered: - TSH; Future - CBC; Future - Comprehensive metabolic panel; Future Referrals ordered: - Ambulatory referral to Cardiology

## 2017-12-29 ENCOUNTER — Ambulatory Visit: Payer: BLUE CROSS/BLUE SHIELD | Admitting: Family Medicine

## 2017-12-29 ENCOUNTER — Encounter: Payer: Self-pay | Admitting: Family Medicine

## 2017-12-29 DIAGNOSIS — M170 Bilateral primary osteoarthritis of knee: Secondary | ICD-10-CM

## 2017-12-29 MED ORDER — DICLOFENAC SODIUM 2 % TD SOLN: 1.0000 "application " | Freq: Two times a day (BID) | TRANSDERMAL | 3 refills | Status: DC

## 2017-12-29 NOTE — Patient Instructions (Signed)
Take tylenol 650 mg three times a day is the best evidence based medicine we have for arthritis.   Glucosamine sulfate 750mg twice a day is a supplement that has been shown to help moderate to severe arthritis.  Vitamin D 2000 IU daily  Fish oil 2 grams daily.  Tumeric 500mg twice daily.   Capsaicin topically up to four times a day may also help with pain.  Cortisone injections are an option if these interventions do not seem to make a difference or need more relief.   If cortisone injections do not help, there are different types of shots that may help but they take longer to take effect.  We can discuss this at follow up.   It's important that you continue to stay active.  Controlling your weight is important.   Consider physical therapy to strengthen muscles around the joint that hurts to take pressure off of the joint itself.  Shoe inserts with good arch support may be helpful.  Spenco orthotics at [employer] could help.   Water aerobics and cycling with low resistance are the best two types of exercise for arthritis.   

## 2017-12-29 NOTE — Assessment & Plan Note (Signed)
Pain likely related to OA. Likely has a component of PF syndrome as well.  - b/l inj today  - pennsaid  - counseled on HEP  - If fails steroid injections could consider visco supplementation.

## 2017-12-29 NOTE — Progress Notes (Signed)
Courtney Bates - 52 y.o. female MRN [id number]  Date of birth: 1966/12/09  SUBJECTIVE:  Including CC & ROS.  Chief Complaint  Patient presents with  . Bilateral Knee pain    Courtney Bates is a 52 y.o. female that is here for an evaluation of bilateral knee pain.  Pain has been increasing over the past six months. Pain is located in the medial aspect of her knees. Left is worse than right, Flexion increases pain, describes the pain as stabbing. She has been using ice and heat therapy, motrin with no improvement. Denies injury or surgeries. Standing and walking increase the pain. Admits to numbness and tingling in her legs and feet.  Independent review of left and right knee x-rays from 1/4 shows severe that is bone-on-bone medial joint degeneration on the left and the right is severe but not as bad as the left.     Review of Systems  Constitutional: Negative for fever.  HENT: Negative for sinus pressure.   Respiratory: Negative for shortness of breath.   Cardiovascular: Negative for chest pain.  Gastrointestinal: Negative for abdominal pain.  Musculoskeletal: Positive for joint swelling. Negative for gait problem.  Skin: Negative for color change.  Neurological: Negative for weakness.  Hematological: Negative for adenopathy.  Psychiatric/Behavioral: Negative for agitation.    HISTORY: Past Medical, Surgical, Social, and Family History Reviewed & Updated per EMR.   Pertinent Historical Findings include:  Past Medical History:  Diagnosis Date  . Allergy   . Anemia   . Depression   . Family history of thyroid disease   . Frequent headaches   . GERD (gastroesophageal reflux disease)   . Hypertension   . Hypertensive heart disease    ECHO 01/30/14 with concentric LVH  EF 60% and   . Morbid obesity (HCC)     Past Surgical History:  Procedure Laterality Date  . ganglion cyst  2005   right wrist  . WISDOM TOOTH EXTRACTION      Allergies  Allergen Reactions  .  Penicillins Rash    Family History  Problem Relation Age of Onset  . Hypertension Mother   . Arthritis Mother   . Other Father        brain tumor  . Heart disease Maternal Grandfather   . Heart disease Paternal Grandmother   . Heart disease Paternal Grandfather   . Colon cancer Other   . Esophageal cancer Neg Hx   . Pancreatic cancer Neg Hx   . Prostate cancer Neg Hx   . Rectal cancer Neg Hx   . Stomach cancer Neg Hx      Social History   Socioeconomic History  . Marital status: Single    Spouse name: Not on file  . Number of children: 2  . Years of education: 3114  . Highest education level: Not on file  Social Needs  . Financial resource strain: Not on file  . Food insecurity - worry: Not on file  . Food insecurity - inability: Not on file  . Transportation needs - medical: Not on file  . Transportation needs - non-medical: Not on file  Occupational History  . Occupation: Geographical information systems officerewer  Tobacco Use  . Smoking status: Never Smoker  . Smokeless tobacco: Never Used  Substance and Sexual Activity  . Alcohol use: Yes    Comment: once every 2-3 months  . Drug use: No  . Sexual activity: Yes    Birth control/protection: Condom  Other Topics Concern  . Not on  file  Social History Narrative   Lives with her daughter, works as a Geographical information systems officer, no exercise   Fun: Movies, parks, traveling   Denies religious beliefs effecting health   Feels safe at home and denies abuse     PHYSICAL EXAM:  VS: BP (!) 144/74 (BP Location: Left Arm, Patient Position: Sitting, Cuff Size: Normal)   Pulse 82   Temp 98.3 F (36.8 C) (Oral)   Ht 5\' 7"  (1.702 m)   Wt 285 lb (129.3 kg)   BMI 44.64 kg/m  Physical Exam Gen: NAD, alert, cooperative with exam, well-appearing ENT: normal lips, normal nasal mucosa,  Eye: normal EOM, normal conjunctiva and lids CV:  no edema, +2 pedal pulses   Resp: no accessory muscle use, non-labored,  GI: no masses or tenderness, no hernia  Skin: no rashes, no areas of  induration  Neuro: normal tone, normal sensation to touch Psych:  normal insight, alert and oriented MSK:  Right and Left knee:  Normal to inspection with no erythema  TTP of the medial joint space with left worse than right  Slight effusion appreciated  ROM full in flexion and extension and lower leg rotation. Some instability noticed with valgus testing on left greater than right  Negative Mcmurray's, Non painful patellar compression. Patellar glide without crepitus. Patellar and quadriceps tendons unremarkable. Hamstring and quadriceps strength is normal.  Neurovascularly intact     Aspiration/Injection Procedure Note Courtney Bates [birth year]  Procedure: Injection Indications: left knee pain   Procedure Details Consent: Risks of procedure as well as the alternatives and risks of each were explained to the (patient/caregiver).  Consent for procedure obtained. Time Out: Verified patient identification, verified procedure, site/side was marked, verified correct patient position, special equipment/implants available, medications/allergies/relevent history reviewed, required imaging and test results available.  Performed.  The area was cleaned with iodine and alcohol swabs.    The left knee pain was injected using 1 cc's of 40 mg Depomedrol and 4 cc's of 1% lidocaine with a 22 1 1/2" needle.  Ultrasound was used. Images were obtained in  Long views showing the injection.    A sterile dressing was applied.  Patient did tolerate procedure well.      Aspiration/Injection Procedure Note Courtney Bates 18-Nov-1966  Procedure: Injection Indications: right knee pain  Procedure Details Consent: Risks of procedure as well as the alternatives and risks of each were explained to the (patient/caregiver).  Consent for procedure obtained. Time Out: Verified patient identification, verified procedure, site/side was marked, verified correct patient position, special  equipment/implants available, medications/allergies/relevent history reviewed, required imaging and test results available.  Performed.  The area was cleaned with iodine and alcohol swabs.    The right knee joint  was injected using 1 cc's of 40 mg Depomedrol and 4 cc's of 1% lidocaine with a 22 1 1/2" needle.  Ultrasound was used. Images were obtained in Long views showing the injection.    A sterile dressing was applied.  Patient did tolerate procedure well.          ASSESSMENT & PLAN:   OA (osteoarthritis) of knee Pain likely related to OA. Likely has a component of PF syndrome as well.  - b/l inj today  - pennsaid  - counseled on HEP  - If fails steroid injections could consider visco supplementation.

## 2017-12-31 ENCOUNTER — Encounter: Payer: Self-pay | Admitting: Family Medicine

## 2017-12-31 ENCOUNTER — Ambulatory Visit: Payer: BLUE CROSS/BLUE SHIELD | Admitting: Family Medicine

## 2017-12-31 VITALS — BP 138/72 | Temp 98.1°F | Ht 67.0 in | Wt 284.0 lb

## 2017-12-31 DIAGNOSIS — G43009 Migraine without aura, not intractable, without status migrainosus: Secondary | ICD-10-CM | POA: Diagnosis not present

## 2017-12-31 DIAGNOSIS — G44211 Episodic tension-type headache, intractable: Secondary | ICD-10-CM | POA: Diagnosis not present

## 2017-12-31 DIAGNOSIS — T50905A Adverse effect of unspecified drugs, medicaments and biological substances, initial encounter: Secondary | ICD-10-CM | POA: Diagnosis not present

## 2017-12-31 MED ORDER — RIZATRIPTAN BENZOATE 10 MG PO TABS: ORAL_TABLET | ORAL | 0 refills | Status: DC

## 2017-12-31 NOTE — Progress Notes (Signed)
OFFICE VISIT  12/31/2017   CC:  Chief Complaint  Patient presents with  . Headache    For two days.    HPI:    Patient is a 52 y.o.  female who presents for headache. Onset about 2 d/a, felt onset of HA in diffuse forehead and temples.  Sensitivity to light, slight nausea.  No vomiting. Tm 99-100 last night.  No blurry or double vision.  No preceding neuro sx's or visual aura. Persisted through tylenol dosing. She had a steroid injection in both knees earlier in the day before onset of HA. No URI sx's or cough.  No body aches or rash. A little tense feeling in R post cerv muscle area, but no neck stiffness.   Past Medical History:  Diagnosis Date  . Allergy   . Anemia   . Depression   . Family history of thyroid disease   . Frequent headaches   . GERD (gastroesophageal reflux disease)   . Hypertension   . Hypertensive heart disease    ECHO 01/30/14 with concentric LVH  EF 60% and   . Morbid obesity (HCC)     Past Surgical History:  Procedure Laterality Date  . ganglion cyst  2005   right wrist  . WISDOM TOOTH EXTRACTION      Outpatient Medications Prior to Visit  Medication Sig Dispense Refill  . amLODipine (NORVASC) 10 MG tablet Take 1 tablet (10 mg total) by mouth daily. 90 tablet 0  . Diclofenac Sodium (PENNSAID) 2 % SOLN Place 1 application onto the skin 2 (two) times daily. 1 Bottle 3  . losartan-hydrochlorothiazide (HYZAAR) 100-25 MG tablet Take 1 tablet by mouth daily. 90 tablet 0  . Omega-3 Fatty Acids (FISH OIL) 1000 MG CAPS Take by mouth.    . pantoprazole (PROTONIX) 40 MG tablet Take 1 tablet (40 mg total) by mouth daily. Appointment needed for further refills. 90 tablet 0  . sertraline (ZOLOFT) 50 MG tablet Take 1 tablet (50 mg total) by mouth daily. 30 tablet 3   Facility-Administered Medications Prior to Visit  Medication Dose Route Frequency Provider Last Rate Last Dose  . 0.9 %  sodium chloride infusion  500 mL Intravenous Continuous Armbruster,  Willaim RayasSteven P, MD        Allergies  Allergen Reactions  . Penicillins Rash    ROS As per HPI  PE: Blood pressure 138/72, temperature 98.1 F (36.7 C), temperature source Oral, height 5\' 7"  (1.702 m), weight 284 lb (128.8 kg). Gen: Alert, well appearing.  Patient is oriented to person, place, time, and situation. AFFECT: pleasant, lucid thought and speech.  She smiles. ENT: Ears: EACs clear, normal epithelium.  TMs with good light reflex and landmarks bilaterally.  Eyes: no injection, icteris, swelling, or exudate.  EOMI, PERRLA. Nose: no drainage or turbinate edema/swelling.  No injection or focal lesion.  Mouth: lips without lesion/swelling.  Oral mucosa pink and moist.  Dentition intact and without obvious caries or gingival swelling.  Oropharynx without erythema, exudate, or swelling.  Neck - No masses or thyromegaly or limitation in range of motion.  She has mild TTP in R posterolateral cervical spine soft tissues. Kernig's and brudzinski's neg. CV: RRR, no m/r/g.   LUNGS: CTA bilat, nonlabored resps, good aeration in all lung fields. Head: mild diffuse discomfort to palpation of both temples and all across forehead but technically no tenderness.    LABS:    Chemistry      Component Value Date/Time   NA 140  12/16/2017 1607   K 3.3 (L) 12/16/2017 1607   CL 99 12/16/2017 1607   CO2 31 12/16/2017 1607   BUN 14 12/16/2017 1607   CREATININE 1.10 12/16/2017 1607   CREATININE 1.10 07/01/2014 0905      Component Value Date/Time   CALCIUM 9.7 12/16/2017 1607   ALKPHOS 82 12/16/2017 1607   AST 12 12/16/2017 1607   ALT 7 12/16/2017 1607   BILITOT 0.5 12/16/2017 1607       IMPRESSION AND PLAN:  Headache: mix of tension and migraine.  This could conceivably be a side effect of the steroid injections she got in her knees earlier in the day (same day as onset of HA), but this would be pretty unusual. BP's have been normal. Will treat with maxalt 10mg  1 now and repeat in 2 hrs if  not signif relief. Signs/symptoms to call or return for were reviewed and pt expressed understanding. Monitor bp at home.  Spent 25 min with pt today, with >50% of this time spent in counseling and care coordination regarding the above problems.  An After Visit Summary was printed and given to the patient.  FOLLOW UP: Return if symptoms worsen or fail to improve.  Signed:  Santiago Bumpers, MD           12/31/2017

## 2018-01-05 DIAGNOSIS — M1712 Unilateral primary osteoarthritis, left knee: Secondary | ICD-10-CM | POA: Diagnosis not present

## 2018-01-17 ENCOUNTER — Encounter (INDEPENDENT_AMBULATORY_CARE_PROVIDER_SITE_OTHER): Payer: Self-pay

## 2018-01-17 ENCOUNTER — Encounter: Payer: Self-pay | Admitting: Nurse Practitioner

## 2018-01-17 ENCOUNTER — Ambulatory Visit: Payer: BLUE CROSS/BLUE SHIELD | Admitting: Nurse Practitioner

## 2018-01-17 VITALS — BP 110/86 | HR 98 | Ht 67.0 in | Wt 283.0 lb

## 2018-01-17 DIAGNOSIS — R002 Palpitations: Secondary | ICD-10-CM | POA: Diagnosis not present

## 2018-01-17 DIAGNOSIS — R0602 Shortness of breath: Secondary | ICD-10-CM | POA: Diagnosis not present

## 2018-01-17 NOTE — Patient Instructions (Addendum)
We will be checking the following labs today - BMET   Medication Instructions:    Continue with your current medicines.     Testing/Procedures To Be Arranged:  Echocardiogram  48 hour Holter  Follow-Up:   Will see how your studies turn out and then decide about follow up.     Other Special Instructions:   Here are my tips to lose weight:  1. Drink only water. You do not need milk, juice, tea, soda or diet soda.  2. Do not eat anything "white". This includes white bread, potatoes, rice or mayo  3. Stay away from fried foods and sweets  4. Your portion should be the size of the palm of your hand.  5. Know what your weaknesses are and avoid.   6. Find an exercise you like and do it every day for 45 to 60 minutes.          If you need a refill on your cardiac medications before your next appointment, please call your pharmacy.   Call the [facility] office at [phone] if you have any questions, problems or concerns.

## 2018-01-17 NOTE — Progress Notes (Signed)
CARDIOLOGY OFFICE NOTE  Date:  01/17/2018    Courtney Bates Date of Birth: Sep 02, 1966 Medical Record #[id number]  PCP:  Evaristo BuryShambley, Ashleigh N, NP  Cardiologist:  Nahser (NEW)   Chief Complaint  Patient presents with  . Hypertension  . Palpitations    New patient visit - seen for Dr. Elease HashimotoNahser    History of Present Illness: Courtney Bates is a 52 y.o. female who presents today for a new patient visit. Seen for Dr. Elease HashimotoNahser (NEW).   She does not have a known history of cardiac issues.   She has anemia, depression, headaches, morbid obesity, GERD, and HTN.   Noted that she has switched primary care - had issues with getting meds refilled and was taking her BP medicines only every other day until she could be seen.   Comes in today. Here alone. She has lots of concerns. She says she saw - she thinks - Dr. Donnie Ahoilley several years ago - this was for palpitations. Says nothing was done. She notes she has shortness of breath with basically any activity. Admits it is worse with recent weight gain. Notes heart racing - makes her a little dizzy. She is worried about her chronic low potassium - she is on Hyzaar. Some atypical chest pain several months ago - sounds fleeting in nature - has not returned. She does not exercise due to knee issues but [facility]. Admits she loves foods that are "white". She admits she has room to work on her diet. BP has typically been pretty well controlled - on multiple agents.    Past Medical History:  Diagnosis Date  . Allergy   . Anemia   . Depression   . Family history of thyroid disease   . Frequent headaches   . GERD (gastroesophageal reflux disease)   . Hypertension   . Hypertensive heart disease    ECHO 01/30/14 with concentric LVH  EF 60% and   . Morbid obesity (HCC)     Past Surgical History:  Procedure Laterality Date  . ganglion cyst  2005   right wrist  . WISDOM TOOTH EXTRACTION        Medications: Current Meds  Medication Sig  . amLODipine (NORVASC) 10 MG tablet Take 1 tablet (10 mg total) by mouth daily.  . Diclofenac Sodium (PENNSAID) 2 % SOLN Place 1 application onto the skin 2 (two) times daily.  Marland Kitchen. losartan-hydrochlorothiazide (HYZAAR) 100-25 MG tablet Take 1 tablet by mouth daily.  . Omega-3 Fatty Acids (FISH OIL) 1000 MG CAPS Take by mouth.  . pantoprazole (PROTONIX) 40 MG tablet Take 1 tablet (40 mg total) by mouth daily. Appointment needed for further refills.  . sertraline (ZOLOFT) 50 MG tablet Take 1 tablet (50 mg total) by mouth daily.  . [DISCONTINUED] rizatriptan (MAXALT) 10 MG tablet 1 tab po qd prn HA.  May repeat dose in 2 hours if not SIGNIFICANTLY improved.  Max in 24h is 20mg .   Current Facility-Administered Medications for the 01/17/18 encounter (Office Visit) with Rosalio MacadamiaGerhardt, Ladye Macnaughton C, NP  Medication  . 0.9 %  sodium chloride infusion     Allergies: Allergies  Allergen Reactions  . Penicillins Rash    Social History: The patient  reports that  has never smoked. she has never used smokeless tobacco. She reports that she drinks alcohol. She reports that she does not use drugs.   Family History: The patient's family history includes  Arthritis in her mother; Colon cancer in her other; Heart disease in her maternal grandfather, paternal grandfather, and paternal grandmother; Hypertension in her mother; Other in her father.   Review of Systems: Please see the history of present illness.   Otherwise, the review of systems is positive for none.   All other systems are reviewed and negative.   Physical Exam: VS:  BP 110/86 (BP Location: Left Arm, Patient Position: Sitting, Cuff Size: Large)   Pulse 98   Ht 5\' 7"  (1.702 m)   Wt 283 lb (128.4 kg)   BMI 44.32 kg/m  .  BMI Body mass index is 44.32 kg/m.  Wt Readings from Last 3 Encounters:  01/17/18 283 lb (128.4 kg)  12/31/17 284 lb (128.8 kg)  12/29/17 285 lb (129.3 kg)    General:  Pleasant. Morbidly obese. Alert and in no acute distress.   HEENT: Normal.  Neck: Supple, no JVD, carotid bruits, or masses noted.  Cardiac: Regular rate and rhythm. Heart rate is a little fast. No edema.  Respiratory:  Lungs are clear to auscultation bilaterally with normal work of breathing.  GI: Soft and nontender.  MS: No deformity or atrophy. Gait and ROM intact.  Skin: Warm and dry. Color is normal.  Neuro:  Strength and sensation are intact and no gross focal deficits noted.  Psych: Alert, appropriate and with normal affect.   LABORATORY DATA:  EKG:  EKG is ordered today. This demonstrates NSR. Reviewed with Dr. Elease Hashimoto  Lab Results  Component Value Date   WBC 9.6 12/16/2017   HGB 13.5 12/16/2017   HCT 40.9 12/16/2017   PLT 388.0 12/16/2017   GLUCOSE 97 12/16/2017   CHOL 164 04/08/2016   TRIG 122.0 04/08/2016   HDL 37.00 (L) 04/08/2016   LDLCALC 103 (H) 04/08/2016   ALT 7 12/16/2017   AST 12 12/16/2017   NA 140 12/16/2017   K 3.3 (L) 12/16/2017   CL 99 12/16/2017   CREATININE 1.10 12/16/2017   BUN 14 12/16/2017   CO2 31 12/16/2017   TSH 1.34 12/16/2017   HGBA1C 5.1 09/19/2015     BNP (last 3 results) No results for input(s): BNP in the last 8760 hours.  ProBNP (last 3 results) No results for input(s): PROBNP in the last 8760 hours.   Other Studies Reviewed Today:   Assessment/Plan:  1. Palpitations - endorses heart racing - some lightheadedness - no syncope - will arrange for echocardiogram to rule out structural heart disease. 48 holter to rule out arrhythmia. Supplement potassium as needed. BMET today. Could consider low dose beta blocker but would probably need her other medicines adjusted.   2. Shortness of breath - most likely multifactorial from her weight and deconditioned state. Echo to be arranged.   3. HTN - good control here today.    4. Hypokalemia - rechecking today.   5. Obesity - discussed at length - my tips given today - strongly  encouraged her to do Weight Watchers. Exercise will be key.   Current medicines are reviewed with the patient today.  The patient does not have concerns regarding medicines other than what has been noted above.  The following changes have been made:  See above.  Labs/ tests ordered today include:    Orders Placed This Encounter  Procedures  . Basic metabolic panel  . HOLTER MONITOR - 48 HOUR  . EKG 12-Lead  . ECHOCARDIOGRAM COMPLETE     Disposition:   Further disposition pending. Will see how her  studies turn out.   Patient is agreeable to this plan and will call if any problems develop in the interim.   SignedNorma Fredrickson, NP  01/17/2018 2:58 PM  [facility] [address] [place], [place]  [address] Phone: [phone] Fax: [phone]

## 2018-01-18 LAB — BASIC METABOLIC PANEL
BUN/Creatinine Ratio: 11 (ref 9–23)
BUN: 13 mg/dL (ref 6–24)
CO2: 23 mmol/L (ref 20–29)
Calcium: 9.9 mg/dL (ref 8.7–10.2)
Chloride: 101 mmol/L (ref 96–106)
Creatinine, Ser: 1.14 mg/dL — ABNORMAL HIGH (ref 0.57–1.00)
GFR calc Af Amer: 64 mL/min/{1.73_m2} (ref >59)
GFR calc non Af Amer: 56 mL/min/{1.73_m2} — ABNORMAL LOW (ref >59)
Glucose: 92 mg/dL (ref 65–99)
Potassium: 3.8 mmol/L (ref 3.5–5.2)
Sodium: 142 mmol/L (ref 134–144)

## 2018-01-20 ENCOUNTER — Ambulatory Visit: Payer: BLUE CROSS/BLUE SHIELD | Admitting: Nurse Practitioner

## 2018-01-20 ENCOUNTER — Encounter: Payer: Self-pay | Admitting: Nurse Practitioner

## 2018-01-20 ENCOUNTER — Other Ambulatory Visit (INDEPENDENT_AMBULATORY_CARE_PROVIDER_SITE_OTHER): Payer: BLUE CROSS/BLUE SHIELD

## 2018-01-20 VITALS — BP 134/80 | Temp 98.4°F | Resp 16 | Ht 67.0 in | Wt 285.0 lb

## 2018-01-20 DIAGNOSIS — K219 Gastro-esophageal reflux disease without esophagitis: Secondary | ICD-10-CM | POA: Diagnosis not present

## 2018-01-20 DIAGNOSIS — F419 Anxiety disorder, unspecified: Secondary | ICD-10-CM | POA: Diagnosis not present

## 2018-01-20 DIAGNOSIS — I10 Essential (primary) hypertension: Secondary | ICD-10-CM | POA: Diagnosis not present

## 2018-01-20 DIAGNOSIS — G43009 Migraine without aura, not intractable, without status migrainosus: Secondary | ICD-10-CM

## 2018-01-20 DIAGNOSIS — F329 Major depressive disorder, single episode, unspecified: Secondary | ICD-10-CM | POA: Diagnosis not present

## 2018-01-20 DIAGNOSIS — F32A Depression, unspecified: Secondary | ICD-10-CM

## 2018-01-20 LAB — SEDIMENTATION RATE: SED RATE: 1 mm/h (ref 0–30)

## 2018-01-20 MED ORDER — PANTOPRAZOLE SODIUM 40 MG PO TBEC: 40.0000 mg | DELAYED_RELEASE_TABLET | Freq: Every day | ORAL | 0 refills | Status: DC

## 2018-01-20 NOTE — Progress Notes (Signed)
Name: Courtney Bates   MRN: [id number]    DOB: December 11, 1966   Date:01/20/2018       Progress Note  Subjective  Chief Complaint  Chief Complaint  Patient presents with  . Follow-up    blood pressure, headaches    HPI   Hypertension -maintained on amlodipine 10, hyzaar 100-25 daily Last month when she was here her blood pressure was elevated but she admitted she was not taking her medications daily She was instructed to resume medications, monitor BP at home and return for follow up Reports she has resumed the medications and is tolerating well. She has also been watching her diet and thinking of joining weight watchers Denies chest pain, shortness of breath, edema.  BP Readings from Last 3 Encounters:  01/20/18 134/80  01/17/18 110/86  12/31/17 138/72   Anxiety and depression- she was prescribed  zoloft last month for anxiety and depression Counseling referral was placed She did not start the zoloft yet because she wanted to see if her mood improved but she continues to feel down and depressed and has no energy. She acutally picked up the zoloft before her appointment today and plans to start taking it now. She denies restlessness, insomnia, thoughts of hurting herself or others  Headaches- She complains of new headaches- dull aching in frontal region/forhead The headaches started after she had steroid injection in knees on 1/17 She was actually seen by another provider for this on 1/19 with normal exam and prescription for maxalt-she says the maxalt helped but then the pain returned She has had some blurred vision but this seems to occur when she is not wearing her reading glasses She Denies fevers, confusion, syncope, speech difficulty, nausea, vomiting, nasal congestion, rash. Tylenol does not help but bc powder does provide some relief She was diagnosed with migraines many years ago but has not needed treatment for them for some time-since at least 2017   Patient Active  Problem List   Diagnosis Date Noted  . OA (osteoarthritis) of knee 12/18/2017  . Palpitations 12/18/2017  . Patellar tendinitis of both knees 05/10/2017  . Iron deficiency 05/11/2016  . Routine general medical examination at a health care facility 04/08/2016  . Anxiety and depression 01/12/2016  . Migraine 09/08/2015  . Sleep disturbance 09/08/2015  . LVH (left ventricular hypertrophy) 01/30/2014  . Hypertension   . Morbid obesity (HCC)   . GERD (gastroesophageal reflux disease)     Past Surgical History:  Procedure Laterality Date  . ganglion cyst  2005   right wrist  . WISDOM TOOTH EXTRACTION      Family History  Problem Relation Age of Onset  . Hypertension Mother   . Arthritis Mother   . Other Father        brain tumor  . Heart disease Maternal Grandfather   . Heart disease Paternal Grandmother   . Heart disease Paternal Grandfather   . Colon cancer Other   . Esophageal cancer Neg Hx   . Pancreatic cancer Neg Hx   . Prostate cancer Neg Hx   . Rectal cancer Neg Hx   . Stomach cancer Neg Hx     Social History   Socioeconomic History  . Marital status: Single    Spouse name: Not on file  . Number of children: 2  . Years of education: 6914  . Highest education level: Not on file  Social Needs  . Financial resource strain: Not on file  . Food insecurity - worry: Not  on file  . Food insecurity - inability: Not on file  . Transportation needs - medical: Not on file  . Transportation needs - non-medical: Not on file  Occupational History  . Occupation: Geographical information systems officer  Tobacco Use  . Smoking status: Never Smoker  . Smokeless tobacco: Never Used  Substance and Sexual Activity  . Alcohol use: Yes    Comment: once every 2-3 months  . Drug use: No  . Sexual activity: Yes    Birth control/protection: Condom  Other Topics Concern  . 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  amLODipine (NORVASC) 10 MG tablet, Take 1 tablet (10 mg total) by mouth daily., Disp: 90 tablet, Rfl: 0 .  Diclofenac Sodium (PENNSAID) 2 % SOLN, Place 1 application onto the skin 2 (two) times daily., Disp: 1 Bottle, Rfl: 3 .  losartan-hydrochlorothiazide (HYZAAR) 100-25 MG tablet, Take 1 tablet by mouth daily., Disp: 90 tablet, Rfl: 0 .  Omega-3 Fatty Acids (FISH OIL) 1000 MG CAPS, Take by mouth., Disp: , Rfl:  .  pantoprazole (PROTONIX) 40 MG tablet, Take 1 tablet (40 mg total) by mouth daily. Appointment needed for further refills., Disp: 90 tablet, Rfl: 0 .  sertraline (ZOLOFT) 50 MG tablet, Take 1 tablet (50 mg total) by mouth daily., Disp: 30 tablet, Rfl: 3  Current Facility-Administered Medications:  .  0.9 %  sodium chloride infusion, 500 mL, Intravenous, Continuous, Armbruster, Willaim Rayas, MD  Allergies  Allergen Reactions  . Penicillins Rash     ROS See HPI  Objective  Vitals:   01/20/18 1605  BP: 134/80  Resp: 16  Temp: 98.4 F (36.9 C)  TempSrc: Oral  Weight: 285 lb (129.3 kg)  Height: 5\' 7"  (1.702 m)   Body mass index is 44.64 kg/m.  Physical Exam Vital signs reviewed. Constitutional: Patient appears well-developed and well-nourished. No distress.  HENT: Head: Normocephalic and atraumatic. Ears: Bilateral TMs without erythema or effusion; Nose: Nose normal. Mouth/Throat: Oropharynx is clear and moist. No oropharyngeal exudate. no maxillary or frontal sinus tenderness Eyes: Conjunctivae and EOM are normal. Pupils are equal, round, and reactive to light. No scleral icterus.  Neck: Normal range of motion. Neck supple.  Cardiovascular: Normal rate, regular rhythm and normal heart sounds. No BLE edema. Distal pulses intact. Pulmonary/Chest: Effort normal and breath sounds normal. No respiratory distress. Musculoskeletal: Normal range of motion, no  joint effusions. No gross deformities Neurological: She is alert and oriented to person, place, and time. No cranial nerve deficit. Coordination, balance, strength, speech and gait are normal.  Skin: Skin is warm and dry. No rash noted. No erythema.  Psychiatric: Patient has a normal mood and affect. behavior is normal. Judgment and thought content normal.  Assessment & Plan RTC in about 1 month for follow up of zoloft, headaches  -Reviewed Health Maintenance: up to date

## 2018-01-20 NOTE — Assessment & Plan Note (Signed)
Requests refill today - pantoprazole (PROTONIX) 40 MG tablet; Take 1 tablet (40 mg total) by mouth daily. Appointment needed for further refills.  Dispense: 90 tablet; Refill: 0

## 2018-01-20 NOTE — Assessment & Plan Note (Signed)
Stable, continue current medications We also discussed healthy diet and exercise in the management of hypertension We talked about [employer] free app to manage health

## 2018-01-20 NOTE — Patient Instructions (Signed)
Please head downstairs for lab work.  You may take over the counter medications for your headache as instructed. If your headaches get worse, are not relieved with OTC medications, please let me know.  Please return in about 1 month. We can follow up on your headaches and see how you are doing on the zoloft.  It was good to see you. Thanks for letting me take care of you today :)   Stroke Prevention Some health problems and behaviors may make it more likely for you to have a stroke. Below are ways to lessen your risk of having a stroke.  Be active for at least 30 minutes on most or all days.  Do not smoke. Try not to be around others who smoke.  Do not drink too much alcohol. ? Do not have more than 2 drinks a day if you are a man. ? Do not have more than 1 drink a day if you are a woman and are not pregnant.  Eat healthy foods, such as fruits and vegetables. If you were put on a specific diet, follow the diet as told.  Keep your cholesterol levels under control through diet and medicines. Look for foods that are low in saturated fat, trans fat, cholesterol, and are high in fiber.  If you have diabetes, follow all diet plans and take your medicine as told.  Ask your doctor if you need treatment to lower your blood pressure. If you have high blood pressure (hypertension), follow all diet plans and take your medicine as told by your doctor.  If you are 6718-52 years old, have your blood pressure checked every 3-5 years. If you are age 52 or older, have your blood pressure checked every year.  Keep a healthy weight. Eat foods that are low in calories, salt, saturated fat, trans fat, and cholesterol.  Do not take drugs.  Avoid birth control pills, if this applies. Talk to your doctor about the risks of taking birth control pills.  Talk to your doctor if you have sleep problems (sleep apnea).  Take all medicine as told by your doctor. ? You may be told to take aspirin or blood  thinner medicine. Take this medicine as told by your doctor. ? Understand your medicine instructions.  Make sure any other conditions you have are being taken care of.  Get help right away if:  You suddenly lose feeling (you feel numb) or have weakness in your face, arm, or leg.  Your face or eyelid hangs down to one side.  You suddenly feel confused.  You have trouble talking (aphasia) or understanding what people are saying.  You suddenly have trouble seeing in one or both eyes.  You suddenly have trouble walking.  You are dizzy.  You lose your balance or your movements are clumsy (uncoordinated).  You suddenly have a very bad headache and you do not know the cause.  You have new chest pain.  Your heart feels like it is fluttering or skipping a beat (irregular heartbeat). Do not wait to see if the symptoms above go away. Get help right away. Call your local emergency services (911 in [place] Do not drive yourself to the hospital. This information is not intended to replace advice given to you by your health care provider. Make sure you discuss any questions you have with your health care provider. Document Released: 05/30/2012 Document Revised: 05/06/2016 Document Reviewed: 06/01/2013 Elsevier Interactive Patient Education  [employer]2018 [employer]

## 2018-01-20 NOTE — Assessment & Plan Note (Signed)
She will start zoloft She will RTC in about 1 month for follow up

## 2018-01-20 NOTE — Assessment & Plan Note (Signed)
PE normal today Headaches are relieved with OTC NSAIDS Will check ESR today to rule out temporal arteritis. Strict return precautions, signs of a stroke, and when to call 911 discussed- See AVS for education provided to patient Will consider imaging if headaches persist - Sed Rate (ESR); Future

## 2018-01-27 ENCOUNTER — Other Ambulatory Visit (HOSPITAL_COMMUNITY): Payer: BLUE CROSS/BLUE SHIELD

## 2018-02-03 ENCOUNTER — Other Ambulatory Visit (HOSPITAL_COMMUNITY): Payer: BLUE CROSS/BLUE SHIELD

## 2018-02-03 ENCOUNTER — Encounter (INDEPENDENT_AMBULATORY_CARE_PROVIDER_SITE_OTHER): Payer: Self-pay

## 2018-02-03 ENCOUNTER — Ambulatory Visit (HOSPITAL_COMMUNITY): Payer: BLUE CROSS/BLUE SHIELD | Attending: Cardiovascular Disease

## 2018-02-03 ENCOUNTER — Other Ambulatory Visit: Payer: Self-pay

## 2018-02-03 ENCOUNTER — Ambulatory Visit (INDEPENDENT_AMBULATORY_CARE_PROVIDER_SITE_OTHER): Payer: BLUE CROSS/BLUE SHIELD

## 2018-02-03 DIAGNOSIS — R0602 Shortness of breath: Secondary | ICD-10-CM | POA: Diagnosis not present

## 2018-02-03 DIAGNOSIS — R002 Palpitations: Secondary | ICD-10-CM | POA: Insufficient documentation

## 2018-02-06 ENCOUNTER — Telehealth: Payer: Self-pay | Admitting: Nurse Practitioner

## 2018-02-06 NOTE — Telephone Encounter (Signed)
New Message  Pt returning call about echo results. Please call

## 2018-02-07 NOTE — Telephone Encounter (Signed)
New message ° ° ° °Patient calling for echo results °

## 2018-02-08 ENCOUNTER — Other Ambulatory Visit: Payer: Self-pay | Admitting: *Deleted

## 2018-02-08 MED ORDER — HYDRALAZINE HCL 25 MG PO TABS: 25.0000 mg | ORAL_TABLET | Freq: Two times a day (BID) | ORAL | 3 refills | Status: DC

## 2018-02-14 ENCOUNTER — Other Ambulatory Visit: Payer: Self-pay | Admitting: *Deleted

## 2018-02-14 MED ORDER — METOPROLOL TARTRATE 25 MG PO TABS: 25.0000 mg | ORAL_TABLET | Freq: Two times a day (BID) | ORAL | 9 refills | Status: DC

## 2018-02-24 ENCOUNTER — Ambulatory Visit: Payer: BLUE CROSS/BLUE SHIELD | Admitting: Nurse Practitioner

## 2018-03-03 ENCOUNTER — Other Ambulatory Visit: Payer: Self-pay

## 2018-03-03 DIAGNOSIS — F419 Anxiety disorder, unspecified: Principal | ICD-10-CM

## 2018-03-03 DIAGNOSIS — F32A Depression, unspecified: Secondary | ICD-10-CM

## 2018-03-03 DIAGNOSIS — F329 Major depressive disorder, single episode, unspecified: Secondary | ICD-10-CM

## 2018-03-03 MED ORDER — SERTRALINE HCL 50 MG PO TABS: 50.0000 mg | ORAL_TABLET | Freq: Every day | ORAL | 0 refills | Status: DC

## 2018-03-23 DIAGNOSIS — Z713 Dietary counseling and surveillance: Secondary | ICD-10-CM | POA: Diagnosis not present

## 2018-04-06 ENCOUNTER — Ambulatory Visit: Payer: BLUE CROSS/BLUE SHIELD | Admitting: Cardiovascular Disease

## 2018-04-06 DIAGNOSIS — Z713 Dietary counseling and surveillance: Secondary | ICD-10-CM | POA: Diagnosis not present

## 2018-04-13 ENCOUNTER — Encounter: Payer: Self-pay | Admitting: Family Medicine

## 2018-04-13 ENCOUNTER — Ambulatory Visit: Payer: BLUE CROSS/BLUE SHIELD | Admitting: Family Medicine

## 2018-04-13 VITALS — BP 136/86 | HR 82 | Temp 98.4°F | Ht 67.0 in | Wt 275.0 lb

## 2018-04-13 DIAGNOSIS — M170 Bilateral primary osteoarthritis of knee: Secondary | ICD-10-CM

## 2018-04-13 NOTE — Patient Instructions (Signed)
Good to see you  Please use ice and compression on your knees I have made a referral to Physical therapy  We could try get injections if the steroid injection isn't working

## 2018-04-13 NOTE — Progress Notes (Signed)
Courtney Bates - 52 y.o. female MRN [id number]  Date of birth: January 01, 1966  SUBJECTIVE:  Including CC & ROS.  Chief Complaint  Patient presents with  . bilateral knee pain    Courtney Bates is a 52 y.o. female that is presenting with bilateral knee pain. Pain has been is chronic. She received injections 12/29/17 with improvement. She has been doing water aerobics and using the bicycle.Pain is mild to severe upon flexion and extension. Admits to swelling. Left is worse than right. Left knee pain is located on the lateral aspect. She is trying to lose weight.    Independent review of the left and right knee xray from 1/4 shows severe OA with bone on bone in the medial compartment of the left knee. Right shows bone on bone as well.    Review of Systems  Constitutional: Negative for fever.  HENT: Negative for congestion.   Cardiovascular: Negative for chest pain.  Gastrointestinal: Negative for abdominal distention.  Musculoskeletal: Positive for arthralgias.  Skin: Negative for color change.  Neurological: Negative for weakness.  Hematological: Negative for adenopathy.  Psychiatric/Behavioral: Negative for agitation.    HISTORY: Past Medical, Surgical, Social, and Family History Reviewed & Updated per EMR.   Pertinent Historical Findings include:  Past Medical History:  Diagnosis Date  . Allergy   . Anemia   . Depression   . Family history of thyroid disease   . Frequent headaches   . GERD (gastroesophageal reflux disease)   . Hypertension   . Hypertensive heart disease    ECHO 01/30/14 with concentric LVH  EF 60% and   . Morbid obesity (HCC)     Past Surgical History:  Procedure Laterality Date  . ganglion cyst  2005   right wrist  . WISDOM TOOTH EXTRACTION      Allergies  Allergen Reactions  . Penicillins Rash    Family History  Problem Relation Age of Onset  . Hypertension Mother   . Arthritis Mother   . Other Father        brain tumor  . Heart disease  Maternal Grandfather   . Heart disease Paternal Grandmother   . Heart disease Paternal Grandfather   . Colon cancer Other   . Esophageal cancer Neg Hx   . Pancreatic cancer Neg Hx   . Prostate cancer Neg Hx   . Rectal cancer Neg Hx   . Stomach cancer Neg Hx      Social History   Socioeconomic History  . Marital status: Single    Spouse name: Not on file  . Number of children: 2  . Years of education: 54  . Highest education level: Not on file  Occupational History  . Occupation: Scientist, research (life sciences)  . Financial resource strain: Not on file  . Food insecurity:    Worry: Not on file    Inability: Not on file  . Transportation needs:    Medical: Not on file    Non-medical: Not on file  Tobacco Use  . Smoking status: Never Smoker  . Smokeless tobacco: Never Used  Substance and Sexual Activity  . Alcohol use: Yes    Comment: once every 2-3 months  . Drug use: No  . Sexual activity: Yes    Birth control/protection: Condom  Lifestyle  . Physical activity:    Days per week: Not on file    Minutes per session: Not on file  . Stress: Not on file  Relationships  . Social connections:  Talks on phone: Not on file    Gets together: Not on file    Attends religious service: Not on file    Active member of club or organization: Not on file    Attends meetings of clubs or organizations: Not on file    Relationship status: Not on file  . Intimate partner violence:    Fear of current or ex partner: Not on file    Emotionally abused: Not on file    Physically abused: Not on file    Forced sexual activity: Not on file  Other Topics Concern  . 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, works as a sewer, no exercise   Fun: Movies, parks, traveling   Denies religious beliefs effecting health   Feels safe at home and denies abuse     PHYSICAL EXAM:  VS: BP 136/86 (BP Location: Left Arm, Patient Position: Sitting, Cuff Size: Large)   Pulse 82    Temp 98.4 F (36.9 C) (Oral)   Ht  (1.702 m)   Wt 275 lb (124.7 kg)   BMI 43.07 kg/m  Physical Exam Gen: NAD, alert, cooperative with exam, well-appearing ENT: normal lips, normal nasal mucosa,  Eye: normal EOM, normal conjunctiva and lids CV:  no edema, +2 pedal pulses   Resp: no accessory muscle use, non-labored,  Skin: no rashes, no areas of induration  Neuro: normal tone, normal sensation to touch Psych:  normal insight, alert and oriented MSK:  Left and Right knee:  No obvious effusion  Normal flexion and extension  Negative McMurray's  No pain with patellar grind or compression  Normal gait  Neurovascularly intact    Aspiration/Injection Procedure Note Courtney Bates May 26, 1966  Procedure: Injection Indications: left knee pain   Procedure Details Consent: Risks of procedure as well as the alternatives and risks of each were explained to the (patient/caregiver).  Consent for procedure obtained. Time Out: Verified patient identification, verified procedure, site/side was marked, verified correct patient position, special equipment/implants available, medications/allergies/relevent history reviewed, required imaging and test results available.  Performed.  The area was cleaned with iodine and alcohol swabs.    The left knee superiolatearl SPP was injected using 1 cc's of 40 mg Depomedrol and 4 cc's of 1% lidocaine with a 25 1 1/2" needle.  Ultrasound was used. Images were obtained in Long views showing the injection.    A sterile dressing was applied.  Patient did tolerate procedure well.   Aspiration/Injection Procedure Note Courtney Bates 1966-07-17  Procedure: Injection Indications: right knee pain   Procedure Details Consent: Risks of procedure as well as the alternatives and risks of each were explained to the (patient/caregiver).  Consent for procedure obtained. Time Out: Verified patient identification, verified procedure, site/side was marked,  verified correct patient position, special equipment/implants available, medications/allergies/relevent history reviewed, required imaging and test results available.  Performed.  The area was cleaned with iodine and alcohol swabs.    The right knee superiolateral SPP was injected using 1 cc's of 40 mg Depomedrol and 4 cc's of 1% lidocaine with a 25 1 1/2" needle.  Ultrasound was used. Images were obtained in Long views showing the injection.     A sterile dressing was applied.  Patient did tolerate procedure well.         ASSESSMENT & PLAN:   OA (osteoarthritis) of knee Acute exacerbation of her pain  - injections today  - PT  - would try  gel injections going forward.

## 2018-04-14 NOTE — Assessment & Plan Note (Signed)
Acute exacerbation of her pain  - injections today  - PT  - would try gel injections going forward.

## 2018-04-17 ENCOUNTER — Ambulatory Visit: Payer: BLUE CROSS/BLUE SHIELD | Admitting: Nurse Practitioner

## 2018-04-20 DIAGNOSIS — Z713 Dietary counseling and surveillance: Secondary | ICD-10-CM | POA: Diagnosis not present

## 2018-04-29 ENCOUNTER — Other Ambulatory Visit: Payer: Self-pay | Admitting: Nurse Practitioner

## 2018-04-29 DIAGNOSIS — I10 Essential (primary) hypertension: Secondary | ICD-10-CM

## 2018-04-29 DIAGNOSIS — K219 Gastro-esophageal reflux disease without esophagitis: Secondary | ICD-10-CM

## 2018-05-01 ENCOUNTER — Other Ambulatory Visit: Payer: Self-pay | Admitting: Nurse Practitioner

## 2018-05-01 DIAGNOSIS — I10 Essential (primary) hypertension: Secondary | ICD-10-CM

## 2018-05-04 DIAGNOSIS — Z713 Dietary counseling and surveillance: Secondary | ICD-10-CM | POA: Diagnosis not present

## 2018-05-18 DIAGNOSIS — Z713 Dietary counseling and surveillance: Secondary | ICD-10-CM | POA: Diagnosis not present

## 2018-05-19 ENCOUNTER — Ambulatory Visit: Payer: BLUE CROSS/BLUE SHIELD | Admitting: Nurse Practitioner

## 2018-05-19 ENCOUNTER — Other Ambulatory Visit (INDEPENDENT_AMBULATORY_CARE_PROVIDER_SITE_OTHER): Payer: BLUE CROSS/BLUE SHIELD

## 2018-05-19 ENCOUNTER — Encounter: Payer: Self-pay | Admitting: Nurse Practitioner

## 2018-05-19 VITALS — BP 132/80 | HR 75 | Temp 98.0°F | Ht 67.0 in | Wt 281.5 lb

## 2018-05-19 DIAGNOSIS — F329 Major depressive disorder, single episode, unspecified: Secondary | ICD-10-CM

## 2018-05-19 DIAGNOSIS — R899 Unspecified abnormal finding in specimens from other organs, systems and tissues: Secondary | ICD-10-CM

## 2018-05-19 DIAGNOSIS — F419 Anxiety disorder, unspecified: Secondary | ICD-10-CM | POA: Diagnosis not present

## 2018-05-19 DIAGNOSIS — R05 Cough: Secondary | ICD-10-CM | POA: Diagnosis not present

## 2018-05-19 DIAGNOSIS — G43009 Migraine without aura, not intractable, without status migrainosus: Secondary | ICD-10-CM | POA: Diagnosis not present

## 2018-05-19 DIAGNOSIS — R059 Cough, unspecified: Secondary | ICD-10-CM

## 2018-05-19 DIAGNOSIS — F32A Depression, unspecified: Secondary | ICD-10-CM

## 2018-05-19 LAB — POCT EXHALED NITRIC OXIDE: FeNO level (ppb): 5

## 2018-05-19 LAB — BASIC METABOLIC PANEL
BUN: 19 mg/dL (ref 6–23)
CALCIUM: 9.9 mg/dL (ref 8.4–10.5)
CO2: 30 mEq/L (ref 19–32)
Chloride: 100 mEq/L (ref 96–112)
Creatinine, Ser: 1.08 mg/dL (ref 0.40–1.20)
GFR: 68.66 mL/min (ref >60.00)
GLUCOSE: 95 mg/dL (ref 70–99)
Potassium: 3.5 mEq/L (ref 3.5–5.1)
Sodium: 139 mEq/L (ref 135–145)

## 2018-05-19 MED ORDER — SERTRALINE HCL 25 MG PO TABS: 50.0000 mg | ORAL_TABLET | Freq: Every day | ORAL | 1 refills | Status: DC

## 2018-05-19 NOTE — Progress Notes (Signed)
Name: Courtney Bates   MRN: [id number]    DOB: [birth year]   Date:05/19/2018       Progress Note  Subjective  Chief Complaint Cough, headache and anxiety follow up  HPI  She was last seen here on 01/20/18 with workup of headaches, anxiety and depression. On 2/8 visit, she was instructed to start zoloft and an ESR was done for headaches, which was normal. She actually returned today for an acute complaint of cough. She says her headaches have gotten better. She says she has been taking 1/2 tablet of zoloft 50 mg daily without any noted adverse effects. She feels the zoloft does help some with her anxiety and depression.  She would like to stay at current dosage and not interested in any changes in this medication today She denies any thoughts of hurting self or others.  She reports for about 2 weeks now she's had a cough. She began with sore throat about 2 weeks ago then over the past week has been fatigued and having a dry cough She tried OTC allergy pills, cough medicine with no relief. She denies fever, itchy watery eyes, sneezing, nasal congestion, chest pain, shortness of breath. Her symptoms are overall improving but cough does not seem to be going away. She does have a history of GERD, but does not notice symptoms if she takes her protonix daily as prescribed. She did have an episode of GERD about 2 weeks ago with esophageal burning and food regurgitation, around the time the cough began, but these symptoms seemed to resolve. She is not a smoker  Depression screen [facility] 2/9 05/19/2018 12/16/2017  Decreased Interest 2 3  Down, Depressed, Hopeless 1 2  PHQ - 2 Score 3 5  Altered sleeping 3 3  Tired, decreased energy 3 3  Change in appetite 0 1  Feeling bad or failure about yourself  1 2  Trouble concentrating 1 1  Moving slowly or fidgety/restless 0 1  Suicidal thoughts 0 1  PHQ-9 Score 11 17  Difficult doing work/chores Not difficult at all -   GAD 7 : Generalized Anxiety Score  05/19/2018 12/16/2017  Nervous, Anxious, on Edge 2 2  Control/stop worrying 1 3  Worry too much - different things 2 3  Trouble relaxing 3 3  Restless 0 1  Easily annoyed or irritable 2 2  Afraid - awful might happen 1 3  Total GAD 7 Score 11 17  Anxiety Difficulty Somewhat difficult -      Patient Active Problem List   Diagnosis Date Noted  . OA (osteoarthritis) of knee 12/18/2017  . Palpitations 12/18/2017  . Patellar tendinitis of both knees 05/10/2017  . Iron deficiency 05/11/2016  . Routine general medical examination at a health care facility 04/08/2016  . Anxiety and depression 01/12/2016  . Migraine 09/08/2015  . Sleep disturbance 09/08/2015  . LVH (left ventricular hypertrophy) 01/30/2014  . Hypertension   . Morbid obesity ([place])   . GERD (gastroesophageal reflux disease)     Past Surgical History:  Procedure Laterality Date  . ganglion cyst  2005   right wrist  . WISDOM TOOTH EXTRACTION      Family History  Problem Relation Age of Onset  . Hypertension Mother   . Arthritis Mother   . Other Father        brain tumor  . Heart disease Maternal Grandfather   . Heart disease Paternal Grandmother   . Heart disease Paternal Grandfather   . Colon cancer Other   .  Esophageal cancer Neg Hx   . Pancreatic cancer Neg Hx   . Prostate cancer Neg Hx   . Rectal cancer Neg Hx   . Stomach cancer Neg Hx     Social History   Socioeconomic History  . Marital status: Single    Spouse name: Not on file  . Number of children: 2  . Years of education: 31  . Highest education level: Not on file  Occupational History  . Occupation: Games developer  . Financial resource strain: Not on file  . Food insecurity:    Worry: Not on file    Inability: Not on file  . Transportation needs:    Medical: Not on file    Non-medical: Not on file  Tobacco Use  . Smoking status: Never Smoker  . Smokeless tobacco: Never Used  Substance and Sexual Activity  . Alcohol use: Yes     Comment: once every 2-3 months  . Drug use: No  . Sexual activity: Yes    Birth control/protection: Condom  Lifestyle  . Physical activity:    Days per week: Not on file    Minutes per session: Not on file  . Stress: Not on file  Relationships  . Social connections:    Talks on phone: Not on file    Gets together: Not on file    Attends religious service: Not on file    Active member of club or organization: Not on file    Attends meetings of clubs or organizations: Not on file    Relationship status: Not on file  . Intimate partner violence:    Fear of current or ex partner: Not on file    Emotionally abused: Not on file    Physically abused: Not on file    Forced sexual activity: Not on file  Other Topics Concern  . 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  amLODipine (NORVASC) 10 MG tablet, TAKE 1 TABLET BY MOUTH EVERY DAY, Disp: 90 tablet, Rfl: 0 .  Diclofenac Sodium (PENNSAID) 2 % SOLN, Place 1 application onto the skin 2 (two) times daily., Disp: 1 Bottle, Rfl: 3 .  hydrALAZINE (APRESOLINE) 25 MG tablet, TAKE 1 TABLET BY MOUTH TWICE A DAY, Disp: 60 tablet, Rfl: 6 .  losartan-hydrochlorothiazide (HYZAAR) 100-25 MG tablet, TAKE 1 TABLET BY MOUTH EVERY DAY, Disp: 90 tablet, Rfl: 0 .  metoprolol tartrate (LOPRESSOR) 25 MG tablet, Take 1 tablet (25 mg total) by mouth 2 (two) times daily., Disp: 60 tablet, Rfl: 9 .  Omega-3 Fatty Acids (FISH OIL) 1000 MG CAPS, Take by mouth., Disp: , Rfl:  .  pantoprazole (PROTONIX) 40 MG tablet, TAKE 1 TABLET (40 MG TOTAL) BY MOUTH DAILY. APPOINTMENT NEEDED FOR FURTHER REFILLS., Disp: 90 tablet, Rfl: 0 .  sertraline (ZOLOFT) 25 MG tablet, Take 2 tablets (50 mg total) by mouth daily., Disp: 30 tablet, Rfl: 1  Current  Facility-Administered Medications:  .  0.9 %  sodium chloride infusion, 500 mL, Intravenous, Continuous, Armbruster, Carlota Raspberry, MD  Allergies  Allergen Reactions  . Penicillins Rash     ROS See HPI  Objective  Vitals:   05/19/18 1622  BP: 132/80  Pulse: 75  Temp: 98 F (36.7 C)  TempSrc:  Oral  SpO2: 95%  Weight: 281 lb 8 oz (127.7 kg)  Height: '5\' 7"'$  (1.702 m)   Body mass index is 44.09 kg/m.  Physical Exam Vital signs reviewed. Constitutional: Patient appears well-developed and well-nourished. No distress.  HENT: Head: Normocephalic and atraumatic. Ears: Bilateral TMs without erythema or effusion; Nose: Nose normal. Mouth/Throat: Oropharynx is clear and moist. No oropharyngeal exudate.  Eyes: Conjunctivae and EOM are normal. Pupils are equal, round, and reactive to light. No scleral icterus.  Neck: Normal range of motion. Neck supple. No cervical adenopathy. Cardiovascular: Normal rate, regular rhythm and normal heart sounds. Distal pulses intact. Pulmonary/Chest: Effort normal and breath sounds normal. No respiratory distress. Neurological: She is alert and oriented to person, place, and time. No cranial nerve deficit. Coordination, balance, strength, speech and gait are normal.  Skin: Skin is warm and dry. No rash noted. No erythema.  Psychiatric: Patient has a normal mood and affect. behavior is normal. Judgment and thought content normal.   Assessment & Plan F/U TBD pending lab results  -Reviewed Health Maintenance: up to date  Cough Could be GERD or allergic rhinitis, doubt infection  Instructed to increase PPI dosage to BID for 2 weeks and follow up if cough is persistent after this trial - POCT EXHALED NITRIC OXIDE-5  Abnormal laboratory test BMET 01/17/18 showed elevated creatinine and decreased GFR, will repeat today for follow up - Basic metabolic panel; Future

## 2018-05-19 NOTE — Patient Instructions (Addendum)
Please head downstairs for lab work/x-rays. If any of your test results are critically abnormal, you will be contacted right away. Your results may be released to your MyChart for viewing before I am able to provide you with my response. I will contact you within a week about your test results and any recommendations for abnormalities.  Please increase your protonix to twice daily for 2 weeks, then reduce back to once daily. Please follow up if your cough continues after this.  We will decide when you should return to see me after I get your lab results today.

## 2018-05-19 NOTE — Assessment & Plan Note (Signed)
headaches have Improved No complaints today F/U for new, worsening symptoms

## 2018-05-19 NOTE — Assessment & Plan Note (Signed)
Stable Continue current medication F/U For new, worsening symptoms - sertraline (ZOLOFT) 25 MG tablet; Take 2 tablets (50 mg total) by mouth daily.  Dispense: 30 tablet; Refill: 1

## 2018-05-25 ENCOUNTER — Telehealth: Payer: Self-pay | Admitting: Nurse Practitioner

## 2018-05-25 NOTE — Telephone Encounter (Signed)
Encounter per result notes

## 2018-05-25 NOTE — Telephone Encounter (Signed)
Copied from CRM [phone]. Topic: Quick Communication - Lab Results >> May 24, 2018  9:48 AM Barbette ReichmannWrenn, Taylor E, CMA wrote: Called patient to inform them of 05/19/18 lab results. When patient returns call, triage nurse may disclose results. >> May 25, 2018  3:44 PM Laural BenesJohnson, Louisianahiquita C wrote: Pt returned call for lab results.

## 2018-09-29 ENCOUNTER — Ambulatory Visit: Payer: BLUE CROSS/BLUE SHIELD | Admitting: Family Medicine

## 2018-09-29 ENCOUNTER — Encounter: Payer: Self-pay | Admitting: Family Medicine

## 2018-09-29 DIAGNOSIS — M170 Bilateral primary osteoarthritis of knee: Secondary | ICD-10-CM

## 2018-09-29 NOTE — Progress Notes (Signed)
Courtney Bates - 52 y.o. female MRN [id number]  Date of birth: February 28, 1966  SUBJECTIVE:  Including CC & ROS.  No chief complaint on file.   Courtney Bates is a 52 y.o. female that is presenting with acute left knee pain.  The pain is acute on chronic in nature.  She denies any mechanism of injury.  She reports significant pain with walking.  She is tried over-the-counter medications.  She denies any mechanical symptoms.  The pain is localized to the knee.  The pain is severe in nature.  Denies any improvement with home modalities..  Independent review of the left knee x-ray from 1/4 shows severe degenerative change of the medial compartment.   Review of Systems  Constitutional: Negative for fever.  HENT: Negative for congestion.   Respiratory: Negative for cough.   Cardiovascular: Negative for chest pain.  Gastrointestinal: Negative for abdominal pain.  Musculoskeletal: Positive for gait problem.  Skin: Negative for color change.  Neurological: Negative for weakness.  Hematological: Negative for adenopathy.  Psychiatric/Behavioral: Negative for agitation.    HISTORY: Past Medical, Surgical, Social, and Family History Reviewed & Updated per EMR.   Pertinent Historical Findings include:  Past Medical History:  Diagnosis Date  . Allergy   . Anemia   . Depression   . Family history of thyroid disease   . Frequent headaches   . GERD (gastroesophageal reflux disease)   . Hypertension   . Hypertensive heart disease    ECHO 01/30/14 with concentric LVH  EF 60% and   . Morbid obesity ([facility])     Past Surgical History:  Procedure Laterality Date  . ganglion cyst  2005   right wrist  . WISDOM TOOTH EXTRACTION      Allergies  Allergen Reactions  . Penicillins Rash    Family History  Problem Relation Age of Onset  . Hypertension Mother   . Arthritis Mother   . Other Father        brain tumor  . Heart disease Maternal Grandfather   . Heart disease Paternal Grandmother     . Heart disease Paternal Grandfather   . Colon cancer Other   . Esophageal cancer Neg Hx   . Pancreatic cancer Neg Hx   . Prostate cancer Neg Hx   . Rectal cancer Neg Hx   . Stomach cancer Neg Hx      Social History   Socioeconomic History  . Marital status: Single    Spouse name: Not on file  . Number of children: 2  . Years of education: 16  . Highest education level: Not on file  Occupational History  . Occupation: Scientist, research (life sciences)  . Financial resource strain: Not on file  . Food insecurity:    Worry: Not on file    Inability: Not on file  . Transportation needs:    Medical: Not on file    Non-medical: Not on file  Tobacco Use  . Smoking status: Never Smoker  . Smokeless tobacco: Never Used  Substance and Sexual Activity  . Alcohol use: Yes    Comment: once every 2-3 months  . Drug use: No  . Sexual activity: Yes    Birth control/protection: Condom  Lifestyle  . Physical activity:    Days per week: Not on file    Minutes per session: Not on file  . Stress: Not on file  Relationships  . Social connections:    Talks on phone: Not on file  Gets together: Not on file    Attends religious service: Not on file    Active member of club or organization: Not on file    Attends meetings of clubs or organizations: Not on file    Relationship status: Not on file  . Intimate partner violence:    Fear of current or ex partner: Not on file    Emotionally abused: Not on file    Physically abused: Not on file    Forced sexual activity: Not on file  Other Topics Concern  . 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, works as a sewer, no exercise   Fun: Movies, parks, traveling   Denies religious beliefs effecting health   Feels safe at home and denies abuse     PHYSICAL EXAM:  VS: BP 114/72   Pulse 88   Resp 16  Physical Exam Gen: NAD, alert, cooperative with exam, well-appearing ENT: normal lips, normal nasal mucosa,  Eye: normal  EOM, normal conjunctiva and lids CV:  no edema, +2 pedal pulses   Resp: no accessory muscle use, non-labored,  Skin: no rashes, no areas of induration  Neuro: normal tone, normal sensation to touch Psych:  normal insight, alert and oriented MSK:  Left Knee: Normal to inspection with no erythema or effusion or obvious bony abnormalities. Palpation normal with no warmth Tender to palpation of the medial joint line and medial femoral condyle tenderness. ROM full in flexion and extension and lower leg rotation. Mild instability with valgus testing Negative Mcmurray's tests. Non painful patellar compression. Patellar and quadriceps tendons unremarkable. Hamstring and quadriceps strength is normal.  Neurovascularly intact    Aspiration/Injection Procedure Note Courtney Bates Oct 07, 1966  Procedure: Injection Indications: left knee pain   Procedure Details Consent: Risks of procedure as well as the alternatives and risks of each were explained to the (patient/caregiver).  Consent for procedure obtained. Time Out: Verified patient identification, verified procedure, site/side was marked, verified correct patient position, special equipment/implants available, medications/allergies/relevent history reviewed, required imaging and test results available.  Performed.  The area was cleaned with iodine and alcohol swabs.    The left knee superior lateral suprapatellar pouch was injected using 1 cc's of 40 mg kenalog and 4 cc's of 0.5% bupivacaine with a 22 1 1/2" needle.  Ultrasound was used. Images were obtained in Long views showing the injection.    A sterile dressing was applied.  Patient did tolerate procedure well.    ASSESSMENT & PLAN:   OA (osteoarthritis) of knee Acute on chronic exacerbation of her left knee pain.  No mechanism of injury.  No suggestion of infection. -Injection of the left knee today. -Counseled on home exercise therapy and supportive care. -Could consider  gel injections or physical therapy if no improvement.

## 2018-09-29 NOTE — Patient Instructions (Signed)
Good to see you  Please ice your knee  Please use tylenol  Please use compression and your brace  Please see me back if the injection doesn't help and we can try the gel injections.

## 2018-10-01 NOTE — Assessment & Plan Note (Signed)
Acute on chronic exacerbation of her left knee pain.  No mechanism of injury.  No suggestion of infection. -Injection of the left knee today. -Counseled on home exercise therapy and supportive care. -Could consider gel injections or physical therapy if no improvement.

## 2018-10-02 ENCOUNTER — Other Ambulatory Visit: Payer: Self-pay | Admitting: Nurse Practitioner

## 2018-10-02 DIAGNOSIS — F419 Anxiety disorder, unspecified: Principal | ICD-10-CM

## 2018-10-02 DIAGNOSIS — I10 Essential (primary) hypertension: Secondary | ICD-10-CM

## 2018-10-02 DIAGNOSIS — K219 Gastro-esophageal reflux disease without esophagitis: Secondary | ICD-10-CM

## 2018-10-02 DIAGNOSIS — F32A Depression, unspecified: Secondary | ICD-10-CM

## 2018-10-02 DIAGNOSIS — F329 Major depressive disorder, single episode, unspecified: Secondary | ICD-10-CM

## 2018-10-03 DIAGNOSIS — Z713 Dietary counseling and surveillance: Secondary | ICD-10-CM | POA: Diagnosis not present

## 2018-10-09 ENCOUNTER — Telehealth: Payer: Self-pay | Admitting: *Deleted

## 2018-10-09 NOTE — Telephone Encounter (Signed)
Left VM regarding visco gel injections for her knee pain. Request to call me back regarding her insurance benefits.

## 2018-10-10 DIAGNOSIS — Z713 Dietary counseling and surveillance: Secondary | ICD-10-CM | POA: Diagnosis not present

## 2018-10-20 DIAGNOSIS — Z1231 Encounter for screening mammogram for malignant neoplasm of breast: Secondary | ICD-10-CM | POA: Diagnosis not present

## 2018-10-20 LAB — HM MAMMOGRAPHY

## 2018-10-23 DIAGNOSIS — Z23 Encounter for immunization: Secondary | ICD-10-CM | POA: Diagnosis not present

## 2018-10-24 DIAGNOSIS — Z713 Dietary counseling and surveillance: Secondary | ICD-10-CM | POA: Diagnosis not present

## 2018-10-30 ENCOUNTER — Encounter: Payer: Self-pay | Admitting: Nurse Practitioner

## 2018-10-31 DIAGNOSIS — Z713 Dietary counseling and surveillance: Secondary | ICD-10-CM | POA: Diagnosis not present

## 2018-11-14 DIAGNOSIS — Z713 Dietary counseling and surveillance: Secondary | ICD-10-CM | POA: Diagnosis not present

## 2018-11-28 DIAGNOSIS — Z713 Dietary counseling and surveillance: Secondary | ICD-10-CM | POA: Diagnosis not present

## 2018-12-26 DIAGNOSIS — Z713 Dietary counseling and surveillance: Secondary | ICD-10-CM | POA: Diagnosis not present

## 2019-01-09 DIAGNOSIS — Z713 Dietary counseling and surveillance: Secondary | ICD-10-CM | POA: Diagnosis not present

## 2019-01-29 ENCOUNTER — Ambulatory Visit: Payer: BLUE CROSS/BLUE SHIELD | Admitting: Nurse Practitioner

## 2019-02-01 ENCOUNTER — Encounter: Payer: BLUE CROSS/BLUE SHIELD | Admitting: Nurse Practitioner

## 2019-02-08 ENCOUNTER — Encounter: Payer: BLUE CROSS/BLUE SHIELD | Admitting: Nurse Practitioner

## 2019-02-25 DIAGNOSIS — J069 Acute upper respiratory infection, unspecified: Secondary | ICD-10-CM | POA: Diagnosis not present

## 2019-02-25 DIAGNOSIS — I10 Essential (primary) hypertension: Secondary | ICD-10-CM | POA: Diagnosis not present

## 2019-02-26 ENCOUNTER — Other Ambulatory Visit: Payer: Self-pay | Admitting: *Deleted

## 2019-02-26 DIAGNOSIS — I10 Essential (primary) hypertension: Secondary | ICD-10-CM

## 2019-02-26 DIAGNOSIS — K219 Gastro-esophageal reflux disease without esophagitis: Secondary | ICD-10-CM

## 2019-02-26 MED ORDER — AMLODIPINE BESYLATE 10 MG PO TABS: 10.0000 mg | ORAL_TABLET | Freq: Every day | ORAL | 0 refills | Status: DC

## 2019-02-26 MED ORDER — LOSARTAN POTASSIUM-HCTZ 100-25 MG PO TABS: 1.0000 | ORAL_TABLET | Freq: Every day | ORAL | 0 refills | Status: DC

## 2019-02-26 MED ORDER — PANTOPRAZOLE SODIUM 40 MG PO TBEC: 40.0000 mg | DELAYED_RELEASE_TABLET | Freq: Every day | ORAL | 0 refills | Status: DC

## 2019-02-28 ENCOUNTER — Encounter: Payer: BLUE CROSS/BLUE SHIELD | Admitting: Nurse Practitioner

## 2019-05-04 ENCOUNTER — Other Ambulatory Visit: Payer: Self-pay

## 2019-05-04 ENCOUNTER — Other Ambulatory Visit: Payer: Self-pay | Admitting: Family

## 2019-05-04 ENCOUNTER — Ambulatory Visit (INDEPENDENT_AMBULATORY_CARE_PROVIDER_SITE_OTHER)
Admission: RE | Admit: 2019-05-04 | Discharge: 2019-05-04 | Disposition: A | Discharge status: Home / Self Care | Payer: BLUE CROSS/BLUE SHIELD | Source: Ambulatory Visit | Attending: Family | Admitting: Family

## 2019-05-04 ENCOUNTER — Encounter: Payer: Self-pay | Admitting: Family

## 2019-05-04 ENCOUNTER — Ambulatory Visit (INDEPENDENT_AMBULATORY_CARE_PROVIDER_SITE_OTHER): Payer: BLUE CROSS/BLUE SHIELD | Admitting: Family

## 2019-05-04 ENCOUNTER — Other Ambulatory Visit (INDEPENDENT_AMBULATORY_CARE_PROVIDER_SITE_OTHER): Payer: BLUE CROSS/BLUE SHIELD

## 2019-05-04 VITALS — BP 132/78 | HR 96 | Temp 98.5°F | Ht 67.0 in | Wt 298.0 lb

## 2019-05-04 DIAGNOSIS — R053 Chronic cough: Secondary | ICD-10-CM

## 2019-05-04 DIAGNOSIS — K219 Gastro-esophageal reflux disease without esophagitis: Secondary | ICD-10-CM

## 2019-05-04 DIAGNOSIS — I10 Essential (primary) hypertension: Secondary | ICD-10-CM | POA: Diagnosis not present

## 2019-05-04 DIAGNOSIS — F329 Major depressive disorder, single episode, unspecified: Secondary | ICD-10-CM

## 2019-05-04 DIAGNOSIS — E559 Vitamin D deficiency, unspecified: Secondary | ICD-10-CM | POA: Diagnosis not present

## 2019-05-04 DIAGNOSIS — Z13220 Encounter for screening for lipoid disorders: Secondary | ICD-10-CM | POA: Diagnosis not present

## 2019-05-04 DIAGNOSIS — R05 Cough: Secondary | ICD-10-CM | POA: Diagnosis not present

## 2019-05-04 DIAGNOSIS — R635 Abnormal weight gain: Secondary | ICD-10-CM

## 2019-05-04 DIAGNOSIS — M7651 Patellar tendinitis, right knee: Secondary | ICD-10-CM

## 2019-05-04 DIAGNOSIS — M7652 Patellar tendinitis, left knee: Secondary | ICD-10-CM

## 2019-05-04 DIAGNOSIS — F419 Anxiety disorder, unspecified: Secondary | ICD-10-CM

## 2019-05-04 DIAGNOSIS — F32A Depression, unspecified: Secondary | ICD-10-CM

## 2019-05-04 LAB — LIPID PANEL
Cholesterol: 189 mg/dL (ref 0–200)
HDL: 37.4 mg/dL — ABNORMAL LOW (ref >39.00)
LDL Cholesterol: 117 mg/dL — ABNORMAL HIGH (ref 0–99)
NonHDL: 151.19
Total CHOL/HDL Ratio: 5
Triglycerides: 171 mg/dL — ABNORMAL HIGH (ref 0.0–149.0)
VLDL: 34.2 mg/dL (ref 0.0–40.0)

## 2019-05-04 LAB — CBC WITH DIFFERENTIAL/PLATELET
Basophils Absolute: 0.1 10*3/uL (ref 0.0–0.1)
Basophils Relative: 0.7 % (ref 0.0–3.0)
Eosinophils Absolute: 0.2 10*3/uL (ref 0.0–0.7)
Eosinophils Relative: 2.8 % (ref 0.0–5.0)
HCT: 40.2 % (ref 36.0–46.0)
Hemoglobin: 13.8 g/dL (ref 12.0–15.0)
Lymphocytes Relative: 25.3 % (ref 12.0–46.0)
Lymphs Abs: 2.1 10*3/uL (ref 0.7–4.0)
MCHC: 34.3 g/dL (ref 30.0–36.0)
MCV: 79.3 fl (ref 78.0–100.0)
Monocytes Absolute: 0.7 10*3/uL (ref 0.1–1.0)
Monocytes Relative: 8.8 % (ref 3.0–12.0)
Neutro Abs: 5.1 10*3/uL (ref 1.4–7.7)
Neutrophils Relative %: 62.4 % (ref 43.0–77.0)
Platelets: 350 10*3/uL (ref 150.0–400.0)
RBC: 5.07 Mil/uL (ref 3.87–5.11)
RDW: 13.9 % (ref 11.5–15.5)
WBC: 8.1 10*3/uL (ref 4.0–10.5)

## 2019-05-04 LAB — COMPREHENSIVE METABOLIC PANEL
ALT: 13 U/L (ref 0–35)
AST: 18 U/L (ref 0–37)
Albumin: 4.1 g/dL (ref 3.5–5.2)
Alkaline Phosphatase: 80 U/L (ref 39–117)
BUN: 15 mg/dL (ref 6–23)
CO2: 31 mEq/L (ref 19–32)
Calcium: 9.5 mg/dL (ref 8.4–10.5)
Chloride: 104 mEq/L (ref 96–112)
Creatinine, Ser: 1.01 mg/dL (ref 0.40–1.20)
GFR: 69.53 mL/min (ref >60.00)
Glucose, Bld: 85 mg/dL (ref 70–99)
Potassium: 3.7 mEq/L (ref 3.5–5.1)
Sodium: 144 mEq/L (ref 135–145)
Total Bilirubin: 0.5 mg/dL (ref 0.2–1.2)
Total Protein: 6.9 g/dL (ref 6.0–8.3)

## 2019-05-04 LAB — VITAMIN D 25 HYDROXY (VIT D DEFICIENCY, FRACTURES): VITD: 13.51 ng/mL — ABNORMAL LOW (ref 30.00–100.00)

## 2019-05-04 LAB — HEMOGLOBIN A1C: Hgb A1c MFr Bld: 5.2 % (ref 4.6–6.5)

## 2019-05-04 LAB — TSH: TSH: 1.25 u[IU]/mL (ref 0.35–4.50)

## 2019-05-04 MED ORDER — VENLAFAXINE HCL ER 37.5 MG PO CP24: 37.5000 mg | ORAL_CAPSULE | Freq: Every day | ORAL | 2 refills | Status: DC

## 2019-05-04 MED ORDER — PANTOPRAZOLE SODIUM 40 MG PO TBEC: 40.0000 mg | DELAYED_RELEASE_TABLET | Freq: Every day | ORAL | 3 refills | Status: DC

## 2019-05-04 MED ORDER — LOSARTAN POTASSIUM-HCTZ 100-25 MG PO TABS: 1.0000 | ORAL_TABLET | Freq: Every day | ORAL | 3 refills | Status: DC

## 2019-05-04 MED ORDER — VITAMIN D (ERGOCALCIFEROL) 1.25 MG (50000 UNIT) PO CAPS: 50000.0000 [IU] | ORAL_CAPSULE | ORAL | 0 refills | Status: AC

## 2019-05-04 MED ORDER — METOPROLOL TARTRATE 25 MG PO TABS: 25.0000 mg | ORAL_TABLET | Freq: Two times a day (BID) | ORAL | 3 refills | Status: DC

## 2019-05-04 MED ORDER — AMLODIPINE BESYLATE 10 MG PO TABS: 10.0000 mg | ORAL_TABLET | Freq: Every day | ORAL | 3 refills | Status: DC

## 2019-05-04 MED ORDER — MELOXICAM 15 MG PO TABS: 15.0000 mg | ORAL_TABLET | Freq: Every day | ORAL | 0 refills | Status: DC

## 2019-05-04 NOTE — Progress Notes (Signed)
Courtney Bates is a 53 y.o. female with the following history as recorded in [employer]:  Patient Active Problem List   Diagnosis Date Noted  . OA (osteoarthritis) of knee 12/18/2017  . Palpitations 12/18/2017  . Patellar tendinitis of both knees 05/10/2017  . Iron deficiency 05/11/2016  . Routine general medical examination at a health care facility 04/08/2016  . Anxiety and depression 01/12/2016  . Migraine 09/08/2015  . Sleep disturbance 09/08/2015  . LVH (left ventricular hypertrophy) 01/30/2014  . Hypertension   . Morbid obesity ([place])   . GERD (gastroesophageal reflux disease)     Current Outpatient Medications  Medication Sig Dispense Refill  . amLODipine (NORVASC) 10 MG tablet Take 1 tablet (10 mg total) by mouth daily. 90 tablet 3  . losartan-hydrochlorothiazide (HYZAAR) 100-25 MG tablet Take 1 tablet by mouth daily. 90 tablet 3  . metoprolol tartrate (LOPRESSOR) 25 MG tablet Take 1 tablet (25 mg total) by mouth 2 (two) times daily. 180 tablet 3  . Omega-3 Fatty Acids (FISH OIL) 1000 MG CAPS Take by mouth.    . pantoprazole (PROTONIX) 40 MG tablet Take 1 tablet (40 mg total) by mouth daily. 90 tablet 3  . VENTOLIN HFA 108 (90 Base) MCG/ACT inhaler TAKE 2 PUFFS BY MOUTH EVERY 4 HOURS AS NEEDED    . meloxicam (MOBIC) 15 MG tablet Take 1 tablet (15 mg total) by mouth daily. 30 tablet 0  . venlafaxine XR (EFFEXOR XR) 37.5 MG 24 hr capsule Take 1 capsule (37.5 mg total) by mouth daily with breakfast. Increase to 2 tablets/ day after 1st week 60 capsule 2   No current facility-administered medications for this visit.     Allergies: Penicillins  Past Medical History:  Diagnosis Date  . Allergy   . Anemia   . Depression   . Family history of thyroid disease   . Frequent headaches   . GERD (gastroesophageal reflux disease)   . Hypertension   . Hypertensive heart disease    ECHO 01/30/14 with concentric LVH  EF 60% and   . Morbid obesity ([place])     Past Surgical History:   Procedure Laterality Date  . ganglion cyst  2005   right wrist  . WISDOM TOOTH EXTRACTION      Family History  Problem Relation Age of Onset  . Hypertension Mother   . Arthritis Mother   . Other Father        brain tumor  . Heart disease Maternal Grandfather   . Heart disease Paternal Grandmother   . Heart disease Paternal Grandfather   . Colon cancer Other   . Esophageal cancer Neg Hx   . Pancreatic cancer Neg Hx   . Prostate cancer Neg Hx   . Rectal cancer Neg Hx   . Stomach cancer Neg Hx     Social History   Tobacco Use  . Smoking status: Never Smoker  . Smokeless tobacco: Never Used  Substance Use Topics  . Alcohol use: Yes    Comment: once every 2-3 months    Subjective:  Patient presents to follow-up on chronic care needs; history of hypertension/ anxiety; has not been seen in almost a year; overdue to see cardiology- due for visits every 6 months;  Continuing with chronic bilateral knee pain/ back pain; has not done follow-up with sports medicine since October 2019; notes that is gaining weight because hurts to walk/ move;  Wants to try something different for anxiety/ depression- has used Zoloft in the past  but prefers to use a different medication.  Having increased hot flashes/ menopausal; overdue to see GYN as well; last appointment there was in 2017; Mentions concerns for chronic dry cough- present for over a year; does have allergies and reflux; only taking Protonix once per day;   Objective:  Vitals:   05/04/19 1012  BP: 132/78  Pulse: 96  Temp: 98.5 F (36.9 C)  TempSrc: Oral  SpO2: 98%  Weight: 298 lb (135.2 kg)  Height: '5\' 7"'$  (1.702 m)    General: Well developed, well nourished, in no acute distress  Skin : Warm and dry.  Head: Normocephalic and atraumatic  Eyes: Sclera and conjunctiva clear; pupils round and reactive to light; extraocular movements intact  Ears: External normal; canals clear; tympanic membranes normal  Oropharynx: Pink,  supple. No suspicious lesions  Neck: Supple without thyromegaly, adenopathy  Lungs: Respirations unlabored; clear to auscultation bilaterally without wheeze, rales, rhonchi  CVS exam: normal rate and regular rhythm.  Abdomen: Soft; nontender; nondistended; normoactive bowel sounds; no masses or hepatosplenomegaly  Musculoskeletal: No deformities; no active joint inflammation  Extremities: No edema, cyanosis, clubbing  Vessels: Symmetric bilaterally  Neurologic: Alert and oriented; speech intact; face symmetrical; moves all extremities well; CNII-XII intact without focal deficit  Assessment:  1. Hypertension, unspecified type   2. Gastroesophageal reflux disease, esophagitis presence not specified   3. Lipid screening   4. Vitamin D deficiency   5. Chronic cough   6. Weight gain   7. Patellar tendinitis of both knees   8. Anxiety and depression     Plan:  1. Stable; refill updated; continue same medications; 2. Increase Protonix to bid; re-evaluate response at next OV; 3. Check lipid panel; 4. Check Vitamin D level today; 5. Update CXR today; follow-up to be determined; 6. Suspect combination of menopause/ limited activity; check Hgba1c today;  7. Schedule a follow-up with Dr. Tamala Julian; 8. Trial of Effexor XR- use as directed; follow-up in 4 weeks to evaluate response.   Return in about 4 weeks (around 06/01/2019), or with me and Dr. Tamala Julian, for Schedule with Dr. Smith/ bilateral knee pain.  Orders Placed This Encounter  Procedures  . DG Chest 2 View    Standing Status:   Future    Number of Occurrences:   1    Standing Expiration Date:   07/03/2020    Order Specific Question:   Reason for Exam (SYMPTOM  OR DIAGNOSIS REQUIRED)    Answer:   chronic cough    Order Specific Question:   Is patient pregnant?    Answer:   No    Order Specific Question:   Preferred imaging location?    Answer:   [facility]    Order Specific Question:   Radiology Contrast Protocol - do NOT remove  file path    Answer:   [email or web address]  . CBC w/Diff    Standing Status:   Future    Number of Occurrences:   1    Standing Expiration Date:   05/03/2020  . Comp Met (CMET)    Standing Status:   Future    Number of Occurrences:   1    Standing Expiration Date:   05/03/2020  . Lipid panel    Standing Status:   Future    Number of Occurrences:   1    Standing Expiration Date:   05/03/2020  . Vitamin D (25 hydroxy)    Standing Status:   Future    Number of Occurrences:  1    Standing Expiration Date:   05/03/2020  . TSH    Standing Status:   Future    Number of Occurrences:   1    Standing Expiration Date:   05/03/2020  . HgB A1c    Standing Status:   Future    Number of Occurrences:   1    Standing Expiration Date:   05/03/2020    Requested Prescriptions   Signed Prescriptions Disp Refills  . meloxicam (MOBIC) 15 MG tablet 30 tablet 0    Sig: Take 1 tablet (15 mg total) by mouth daily.  Marland Kitchen amLODipine (NORVASC) 10 MG tablet 90 tablet 3    Sig: Take 1 tablet (10 mg total) by mouth daily.  Marland Kitchen losartan-hydrochlorothiazide (HYZAAR) 100-25 MG tablet 90 tablet 3    Sig: Take 1 tablet by mouth daily.  . pantoprazole (PROTONIX) 40 MG tablet 90 tablet 3    Sig: Take 1 tablet (40 mg total) by mouth daily.  . metoprolol tartrate (LOPRESSOR) 25 MG tablet 180 tablet 3    Sig: Take 1 tablet (25 mg total) by mouth 2 (two) times daily.  Marland Kitchen venlafaxine XR (EFFEXOR XR) 37.5 MG 24 hr capsule 60 capsule 2    Sig: Take 1 capsule (37.5 mg total) by mouth daily with breakfast. Increase to 2 tablets/ day after 1st week

## 2019-05-04 NOTE — Patient Instructions (Signed)
Please take the Protonix twice a day until your follow up;

## 2019-05-14 NOTE — Progress Notes (Signed)
Tawana ScaleZach Smith D.O. [facility] [address] [place], [place] [address] Phone: [phone] Subjective:   Courtney Bates, Courtney Bates, am serving as a scribe for Dr. Antoine PrimasZachary Smith.  I'm seeing this patient by the request  of:  Olive BassMurray, Laura Woodruff, FNP   CC: bilateral knee pain   BJY:[id number]:Subjective  Courtney KetoMina G Bates is a 53 y.o. female coming in with complaint of bilateral knee pain. Has had pain for a long time, left worse than right. Xray 2019. Pain has been increasing recently. Uses heat. Has tried OTC pain reducers which do not work. Does complain of pain radiating up into the quad with activity.       Past Medical History:  Diagnosis Date  . Allergy   . Anemia   . Depression   . Family history of thyroid disease   . Frequent headaches   . GERD (gastroesophageal reflux disease)   . Hypertension   . Hypertensive heart disease    ECHO 01/30/14 with concentric LVH  EF 60% and   . Morbid obesity (HCC)    Past Surgical History:  Procedure Laterality Date  . ganglion cyst  2005   right wrist  . WISDOM TOOTH EXTRACTION     Social History   Socioeconomic History  . Marital status: Single    Spouse name: Not on file  . Number of children: 2  . Years of education: 8114  . Highest education level: Not on file  Occupational History  . Occupation: Scientist, research (life sciences)ewer  Social Needs  . Financial resource strain: Not on file  . Food insecurity:    Worry: Not on file    Inability: Not on file  . Transportation needs:    Medical: Not on file    Non-medical: Not on file  Tobacco Use  . Smoking status: Never Smoker  . Smokeless tobacco: Never Used  Substance and Sexual Activity  . Alcohol use: Yes    Comment: once every 2-3 months  . Drug use: No  . Sexual activity: Yes    Birth control/protection: Condom  Lifestyle  . Physical activity:    Days per week: Not on file    Minutes per session: Not on file  . Stress: Not on file  Relationships  . Social connections:    Talks on phone: Not  on file    Gets together: Not on file    Attends religious service: Not on file    Active member of club or organization: Not on file    Attends meetings of clubs or organizations: Not on file    Relationship status: Not on file  Other Topics Concern  . 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 Penicillins Rash   Family History  Problem Relation Age of Onset  . Hypertension Mother   . Arthritis Mother   . Other Father        brain tumor  . Heart disease Maternal Grandfather   . Heart disease Paternal Grandmother   . Heart disease Paternal Grandfather   . Colon cancer Other   . Esophageal cancer Neg Hx   . Pancreatic cancer Neg Hx   . Prostate cancer Neg Hx   . Rectal cancer Neg Hx   . Stomach cancer Neg Hx  Current Outpatient Medications (Cardiovascular):  .  amLODipine (NORVASC) 10 MG tablet, Take 1 tablet (10 mg total) by mouth daily. Marland Kitchen  losartan-hydrochlorothiazide (HYZAAR) 100-25 MG tablet, Take 1 tablet by mouth daily. .  metoprolol tartrate (LOPRESSOR) 25 MG tablet, Take 1 tablet (25 mg total) by mouth 2 (two) times daily.  Current Outpatient Medications (Respiratory):  Marland Kitchen  VENTOLIN HFA 108 (90 Base) MCG/ACT inhaler, TAKE 2 PUFFS BY MOUTH EVERY 4 HOURS AS NEEDED  Current Outpatient Medications (Analgesics):  .  meloxicam (MOBIC) 15 MG tablet, Take 1 tablet (15 mg total) by mouth daily.   Current Outpatient Medications (Other):  Marland Kitchen  Omega-3 Fatty Acids (FISH OIL) 1000 MG CAPS, Take by mouth. .  pantoprazole (PROTONIX) 40 MG tablet, Take 1 tablet (40 mg total) by mouth daily. Marland Kitchen  venlafaxine XR (EFFEXOR XR) 37.5 MG 24 hr capsule, Take 1 capsule (37.5 mg total) by mouth daily with breakfast. Increase to 2 tablets/ day after 1st week .  Vitamin D, Ergocalciferol,  (DRISDOL) 1.25 MG (50000 UT) CAPS capsule, Take 1 capsule (50,000 Units total) by mouth every 7 (seven) days for 12 doses. .  Diclofenac Sodium 2 % SOLN, Place 2 g onto the skin 2 (two) times daily.    Past medical history, social, surgical and family history all reviewed in electronic medical record.  No pertanent information unless stated regarding to the chief complaint.   Review of Systems:  No headache, visual changes, nausea, vomiting, diarrhea, constipation, dizziness, abdominal pain, skin rash, fevers, chills, night sweats, weight loss, swollen lymph nodes, body aches, joint swelling, muscle aches, chest pain, shortness of breath, mood changes. Positive muscle aches  Objective  Blood pressure 122/86, pulse 85, height 5\' 7"  (1.702 m), weight 298 lb (135.2 kg), SpO2 98 %.    General: No apparent distress alert and oriented x3 mood and affect normal, dressed appropriately.  HEENT: Pupils equal, extraocular movements intact  Respiratory: Patient's speak in full sentences and does not appear short of breath  Cardiovascular: No lower extremity edema, non tender, no erythema  Skin: Warm dry intact with no signs of infection or rash on extremities or on axial skeleton.  Abdomen: Soft nontender  Neuro: Cranial nerves II through XII are intact, neurovascularly intact in all extremities with 2+ DTRs and 2+ pulses.  Lymph: No lymphadenopathy of posterior or anterior cervical chain or axillae bilaterally.  Gait antalgic  MSK:  Non tender with full range of motion and good stability and symmetric strength and tone of shoulders, elbows, wrist, hip and ankles bilaterally.  Knee:bilateral.  Normal to inspection with no erythema or effusion or obvious bony abnormalities. Palpation normal with no warmth, joint line tenderness, patellar tenderness, or condyle tenderness. ROM full in flexion and extension and lower leg rotation. Ligaments with solid consistent endpoints including ACL, PCL, LCL, MCL.  Negative Mcmurray's, Apley's, and Thessalonian tests. painful patellar compression. Patellar glide with moderate crepitus. Patellar and quadriceps tendons unremarkable. After informed written and verbal consent, patient was seated on exam table. Right knee was prepped with alcohol swab and utilizing anterolateral approach, patient's right knee space was injected with 4:1  marcaine 0.5%: Kenalog 40mg /dL. Patient tolerated the procedure well without immediate complications.  After informed written and verbal consent, patient was seated on exam table. Left knee was prepped with alcohol swab and utilizing anterolateral approach, patient's left knee space was injected with 4:1  marcaine 0.5%: Kenalog 40mg /dL. Patient tolerated the procedure well without immediate complications. .   Impression and Recommendations:  This case required medical decision making of moderate complexity. The above documentation has been reviewed and is accurate and complete Lyndal Pulley, DO       Note: This dictation was prepared with Dragon dictation along with smaller phrase technology. Any transcriptional errors that result from this process are unintentional.

## 2019-05-15 ENCOUNTER — Encounter: Payer: Self-pay | Admitting: Family Medicine

## 2019-05-15 ENCOUNTER — Other Ambulatory Visit: Payer: Self-pay

## 2019-05-15 ENCOUNTER — Ambulatory Visit (INDEPENDENT_AMBULATORY_CARE_PROVIDER_SITE_OTHER): Payer: BC Managed Care – PPO | Admitting: Family Medicine

## 2019-05-15 VITALS — BP 122/86 | HR 85 | Ht 67.0 in | Wt 298.0 lb

## 2019-05-15 DIAGNOSIS — M2550 Pain in unspecified joint: Secondary | ICD-10-CM

## 2019-05-15 DIAGNOSIS — M170 Bilateral primary osteoarthritis of knee: Secondary | ICD-10-CM

## 2019-05-15 MED ORDER — DICLOFENAC SODIUM 2 % TD SOLN: 2.0000 g | Freq: Two times a day (BID) | TRANSDERMAL | 3 refills | Status: DC

## 2019-05-15 NOTE — Patient Instructions (Signed)
Good to see you.  Ice 20 minutes 2 times daily. Usually after activity and before bed. Exercises 3 times a week.  pennsaid pinkie amount topically 2 times daily as needed.  COnitnue the vitamin D once a week  Over the counter get turmeric 500mg  daily   We will get approval for gell injections See me again in 3-4 weeks

## 2019-05-15 NOTE — Assessment & Plan Note (Signed)
Bilateral injections.  Could be a candidate for Visco encourage weight loss. OTC meds  RTC in 4 weeks for visco

## 2019-05-26 ENCOUNTER — Other Ambulatory Visit: Payer: Self-pay | Admitting: Family

## 2019-05-28 NOTE — Telephone Encounter (Signed)
Can you call and see how she has done with the Effexor XR? I need to get a corrected prescription for her and trying to decide if she just wants to go to a 75 mg Rx?

## 2019-06-01 ENCOUNTER — Ambulatory Visit (INDEPENDENT_AMBULATORY_CARE_PROVIDER_SITE_OTHER): Payer: BC Managed Care – PPO | Admitting: Family

## 2019-06-01 ENCOUNTER — Ambulatory Visit: Payer: BLUE CROSS/BLUE SHIELD | Admitting: Family

## 2019-06-01 ENCOUNTER — Other Ambulatory Visit (INDEPENDENT_AMBULATORY_CARE_PROVIDER_SITE_OTHER): Payer: BC Managed Care – PPO

## 2019-06-01 ENCOUNTER — Encounter: Payer: Self-pay | Admitting: Family

## 2019-06-01 ENCOUNTER — Other Ambulatory Visit: Payer: Self-pay

## 2019-06-01 VITALS — BP 126/72 | HR 97 | Temp 98.7°F | Ht 67.0 in | Wt 290.1 lb

## 2019-06-01 DIAGNOSIS — I10 Essential (primary) hypertension: Secondary | ICD-10-CM | POA: Diagnosis not present

## 2019-06-01 DIAGNOSIS — M2550 Pain in unspecified joint: Secondary | ICD-10-CM

## 2019-06-01 DIAGNOSIS — K219 Gastro-esophageal reflux disease without esophagitis: Secondary | ICD-10-CM | POA: Diagnosis not present

## 2019-06-01 DIAGNOSIS — G479 Sleep disorder, unspecified: Secondary | ICD-10-CM | POA: Diagnosis not present

## 2019-06-01 DIAGNOSIS — R0683 Snoring: Secondary | ICD-10-CM

## 2019-06-01 LAB — URIC ACID: Uric Acid, Serum: 4.7 mg/dL (ref 2.4–7.0)

## 2019-06-01 MED ORDER — FLUTICASONE PROPIONATE 50 MCG/ACT NA SUSP: 2.0000 | Freq: Every day | NASAL | 6 refills | Status: DC

## 2019-06-01 NOTE — Progress Notes (Signed)
Courtney Bates is a 53 y.o. female with the following history as recorded in EpicCare:  Patient Active Problem List   Diagnosis Date Noted  . Degenerative arthritis of knee, bilateral 05/15/2019  . OA (osteoarthritis) of knee 12/18/2017  . Palpitations 12/18/2017  . Patellar tendinitis of both knees 05/10/2017  . Iron deficiency 05/11/2016  . Routine general medical examination at a health care facility 04/08/2016  . Anxiety and depression 01/12/2016  . Migraine 09/08/2015  . Sleep disturbance 09/08/2015  . LVH (left ventricular hypertrophy) 01/30/2014  . Hypertension   . Morbid obesity ([place])   . GERD (gastroesophageal reflux disease)     Current Outpatient Medications  Medication Sig Dispense Refill  . amLODipine (NORVASC) 10 MG tablet Take 1 tablet (10 mg total) by mouth daily. 90 tablet 3  . Diclofenac Sodium 2 % SOLN Place 2 g onto the skin 2 (two) times daily. 112 g 3  . losartan-hydrochlorothiazide (HYZAAR) 100-25 MG tablet Take 1 tablet by mouth daily. 90 tablet 3  . meloxicam (MOBIC) 15 MG tablet TAKE 1 TABLET BY MOUTH EVERY DAY 30 tablet 0  . metoprolol tartrate (LOPRESSOR) 25 MG tablet Take 1 tablet (25 mg total) by mouth 2 (two) times daily. 180 tablet 3  . Omega-3 Fatty Acids (FISH OIL) 1000 MG CAPS Take by mouth.    . pantoprazole (PROTONIX) 40 MG tablet Take 1 tablet (40 mg total) by mouth daily. 90 tablet 3  . venlafaxine XR (EFFEXOR-XR) 75 MG 24 hr capsule Take 1 capsule (75 mg total) by mouth daily. 90 capsule 1  . VENTOLIN HFA 108 (90 Base) MCG/ACT inhaler TAKE 2 PUFFS BY MOUTH EVERY 4 HOURS AS NEEDED    . Vitamin D, Ergocalciferol, (DRISDOL) 1.25 MG (50000 UT) CAPS capsule Take 1 capsule (50,000 Units total) by mouth every 7 (seven) days for 12 doses. 12 capsule 0  . fluticasone (FLONASE) 50 MCG/ACT nasal spray Place 2 sprays into both nostrils daily. 16 g 6   No current facility-administered medications for this visit.     Allergies: Penicillins  Past Medical  History:  Diagnosis Date  . Allergy   . Anemia   . Depression   . Family history of thyroid disease   . Frequent headaches   . GERD (gastroesophageal reflux disease)   . Hypertension   . Hypertensive heart disease    ECHO 01/30/14 with concentric LVH  EF 60% and   . Morbid obesity ([place])     Past Surgical History:  Procedure Laterality Date  . ganglion cyst  2005   right wrist  . WISDOM TOOTH EXTRACTION      Family History  Problem Relation Age of Onset  . Hypertension Mother   . Arthritis Mother   . Other Father        brain tumor  . Heart disease Maternal Grandfather   . Heart disease Paternal Grandmother   . Heart disease Paternal Grandfather   . Colon cancer Other   . Esophageal cancer Neg Hx   . Pancreatic cancer Neg Hx   . Prostate cancer Neg Hx   . Rectal cancer Neg Hx   . Stomach cancer Neg Hx     Social History   Tobacco Use  . Smoking status: Never Smoker  . Smokeless tobacco: Never Used  Substance Use Topics  . Alcohol use: Yes    Comment: once every 2-3 months    Subjective:  Follow up on myalgia/ hypertension; scheduled to see sports medicine in  follow-up- needs to get labs updated today; Admits not sleeping well "for years"- does feel the Effexor is helping with hot flashes but still not having the energy improvement she had hoped for with this medication; has been told that she does snore; feels that she tosses and turns/ wakes up feeling very unrested; Cough has improved since increasing Protonix to bid; Also feels like her right is "clogged up"- does have history of seasonal allergies but not currently taking anything.      Objective:  Vitals:   06/01/19 1544  BP: 126/72  Pulse: 97  Temp: 98.7 F (37.1 C)  TempSrc: Oral  SpO2: 97%  Weight: 290 lb 1.9 oz (131.6 kg)  Height: 5\' 7"  (1.702 m)    General: Well developed, well nourished, in no acute distress  Skin : Warm and dry.  Head: Normocephalic and atraumatic  Eyes: Sclera and  conjunctiva clear; pupils round and reactive to light; extraocular movements intact  Ears: External normal; canals clear; tympanic membranes congested bilaterally Oropharynx: Pink, supple. No suspicious lesions  Neck: Supple without thyromegaly, adenopathy  Lungs: Respirations unlabored; clear to auscultation bilaterally without wheeze, rales, rhonchi  CVS exam: normal rate and regular rhythm.  Neurologic: Alert and oriented; speech intact; face symmetrical; moves all extremities well; CNII-XII intact without focal deficit   Assessment:  1. Hypertension, unspecified type   2. Snoring   3. Sleep disturbance   4. Gastroesophageal reflux disease, esophagitis presence not specified   5. Polyarthralgia     Plan:  1. Stable; continue same medications; 2. & 3. Schedule for sleep study; 4. Improved with Protonix bid; 5. Encouraged to get labs that sports medicine requested at last office visit; keep planned follow-up with Dr. Katrinka BlazingSmith for 6/23.    No follow-ups on file.  Orders Placed This Encounter  Procedures  . Ambulatory referral to Neurology    Referral Priority:   Routine    Referral Type:   Consultation    Referral Reason:   Specialty Services Required    Requested Specialty:   Neurology    Number of Visits Requested:   1    Requested Prescriptions   Signed Prescriptions Disp Refills  . fluticasone (FLONASE) 50 MCG/ACT nasal spray 16 g 6    Sig: Place 2 sprays into both nostrils daily.

## 2019-06-04 LAB — VITAMIN D 25 HYDROXY (VIT D DEFICIENCY, FRACTURES): VITD: 31.85 ng/mL (ref 30.00–100.00)

## 2019-06-04 LAB — ANA: Anti Nuclear Antibody (ANA): NEGATIVE

## 2019-06-04 LAB — FERRITIN: Ferritin: 36.4 ng/mL (ref 10.0–291.0)

## 2019-06-04 LAB — RHEUMATOID FACTOR: Rheumatoid fact SerPl-aCnc: <14 IU/mL (ref <14)

## 2019-06-05 ENCOUNTER — Ambulatory Visit: Payer: BC Managed Care – PPO | Admitting: Family Medicine

## 2019-06-05 ENCOUNTER — Other Ambulatory Visit: Payer: Self-pay

## 2019-06-05 ENCOUNTER — Encounter: Payer: Self-pay | Admitting: Family Medicine

## 2019-06-05 DIAGNOSIS — M170 Bilateral primary osteoarthritis of knee: Secondary | ICD-10-CM

## 2019-06-05 NOTE — Progress Notes (Signed)
[facility] [place] [place], [place] [address] Phone: [phone] Subjective:   I Courtney Bates am serving as a Education administrator for Dr. Hulan Saas.    CC: Bilateral knee pain  JOA:[id number]   05/15/2019 Bilateral injections.  Could be a candidate for Visco encourage weight loss. OTC meds  RTC in 4 weeks for visco   06/05/2019 Courtney Bates is a 53 y.o. female coming in with complaint of bilateral knee pain. States that her left knee is painful today. Received an injection last visit.  Patient does have known arthritic changes in the knees.  Still having some discomfort.  Did feel the steroid helped out somewhat.  Patient has failed all other conservative therapy.  Has been approved for Visco supplementation.  Still some mild instability of the knees.  Onset-  Location Duration-  Character- Aggravating factors- Reliving factors-  Therapies tried-  Severity-     Past Medical History:  Diagnosis Date  . Allergy   . Anemia   . Depression   . Family history of thyroid disease   . Frequent headaches   . GERD (gastroesophageal reflux disease)   . Hypertension   . Hypertensive heart disease    ECHO 01/30/14 with concentric LVH  EF 60% and   . Morbid obesity ([place])    Past Surgical History:  Procedure Laterality Date  . ganglion cyst  2005   right wrist  . WISDOM TOOTH EXTRACTION     Social History   Socioeconomic History  . Marital status: Single    Spouse name: Not on file  . Number of children: 2  . Years of education: 35  . Highest education level: Not on file  Occupational History  . Occupation: Games developer  . Financial resource strain: Not on file  . Food insecurity    Worry: Not on file    Inability: Not on file  . Transportation needs    Medical: Not on file    Non-medical: Not on file  Tobacco Use  . Smoking status: Never Smoker  . Smokeless tobacco: Never Used  Substance and Sexual Activity  . Alcohol use: Yes     Comment: once every 2-3 months  . Drug use: No  . Sexual activity: Yes    Birth control/protection: Condom  Lifestyle  . Physical activity    Days per week: Not on file    Minutes per session: Not on file  . Stress: Not on file  Relationships  . Social Herbalist on phone: Not on file    Gets together: Not on file    Attends religious service: Not on file    Active member of club or organization: Not on file    Attends meetings of clubs or organizations: Not on file    Relationship status: Not on file  Other Topics Concern  . 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 Penicillins Rash   Family History  Problem Relation Age of Onset  . Hypertension Mother   . Arthritis Mother   . Other Father        brain tumor  . Heart disease Maternal Grandfather   . Heart disease Paternal Grandmother   . Heart  disease Paternal Grandfather   . Colon cancer Other   . Esophageal cancer Neg Hx   . Pancreatic cancer Neg Hx   . Prostate cancer Neg Hx   . Rectal cancer Neg Hx   . Stomach cancer Neg Hx      Current Outpatient Medications (Cardiovascular):  .  amLODipine (NORVASC) 10 MG tablet, Take 1 tablet (10 mg total) by mouth daily. Marland Kitchen.  losartan-hydrochlorothiazide (HYZAAR) 100-25 MG tablet, Take 1 tablet by mouth daily. .  metoprolol tartrate (LOPRESSOR) 25 MG tablet, Take 1 tablet (25 mg total) by mouth 2 (two) times daily.  Current Outpatient Medications (Respiratory):  .  fluticasone (FLONASE) 50 MCG/ACT nasal spray, Place 2 sprays into both nostrils daily. .  VENTOLIN HFA 108 (90 Base) MCG/ACT inhaler, TAKE 2 PUFFS BY MOUTH EVERY 4 HOURS AS NEEDED  Current Outpatient Medications (Analgesics):  .  meloxicam (MOBIC) 15 MG tablet, TAKE 1 TABLET BY MOUTH EVERY DAY    Current Outpatient Medications (Other):  Marland Kitchen.  Diclofenac Sodium 2 % SOLN, Place 2 g onto the skin 2 (two) times daily. .  Omega-3 Fatty Acids (FISH OIL) 1000 MG CAPS, Take by mouth. .  pantoprazole (PROTONIX) 40 MG tablet, Take 1 tablet (40 mg total) by mouth daily. Marland Kitchen.  venlafaxine XR (EFFEXOR-XR) 75 MG 24 hr capsule, Take 1 capsule (75 mg total) by mouth daily. .  Vitamin D, Ergocalciferol, (DRISDOL) 1.25 MG (50000 UT) CAPS capsule, Take 1 capsule (50,000 Units total) by mouth every 7 (seven) days for 12 doses.    Past medical history, social, surgical and family history all reviewed in electronic medical record.  No pertanent information unless stated regarding to the chief complaint.   Review of Systems:  No headache, visual changes, nausea, vomiting, diarrhea, constipation, dizziness, abdominal pain, skin rash, fevers, chills, night sw chest pain, shortness of breath, mood changes.  Positive muscle aches  Objective  Blood pressure (!) 144/90, pulse 77, height 5\' 7"  (1.702 m), weight 293 lb (132.9 kg), SpO2 97 %.    General: No apparent distress alert and oriented x3 mood and affect normal, dressed appropriately.  Obese HEENT: Pupils equal, extraocular movements intact  Respiratory: Patient's speak in full sentences and does not appear short of breath  Cardiovascular: No lower extremity edema, non tender, no erythema  Skin: Warm dry intact with no signs of infection or rash on extremities or on axial skeleton.  Abdomen: Soft nontender  Neuro: Cranial nerves II through XII are intact, neurovascularly intact in all extremities with 2+ DTRs and 2+ pulses.  Lymph: No lymphadenopathy of posterior or anterior cervical chain or axillae bilaterally.  Gait antalgic MSK:  Non tender with full range of motion and good stability and symmetric strength and tone of shoulders, elbows, wrist, hip, and ankles bilaterally.  Knee: Bilateral valgus deformity noted. Large thigh to calf ratio.  Tender to  palpation over medial and PF joint line.  ROM full in flexion and extension and lower leg rotation. instability with valgus force.  painful patellar compression. Patellar glide with moderate crepitus. Patellar and quadriceps tendons unremarkable. Hamstring and quadriceps strength is normal.   After informed written and verbal consent, patient was seated on exam table. Right knee was prepped with alcohol swab and utilizing anterolateral approach, patient's right knee space was injected with 48mg /mLL of SynviscOne (sodium hyaluronate) in a prefilled syringe was injected easily into the knee through a 22-gauge needle.. Patient tolerated the procedure well without immediate complications.   After informed written  and verbal consent, patient was seated on exam table. Left knee was prepped with alcohol swab and utilizing anterolateral approach, patient's left knee space was injected with 28 mg/mL of SynviscOne . Patient tolerated the procedure well without immediate complications.    Impression and Recommendations:     This case required medical decision making of moderate complexity. The above documentation has been reviewed and is accurate and complete Courtney SaaZachary M Caylie Sandquist, DO       Note: This dictation was prepared with Dragon dictation along with smaller phrase technology. Any transcriptional errors that result from this process are unintentional.

## 2019-06-05 NOTE — Assessment & Plan Note (Signed)
Failed conservative therapy.  Discussed with patient and icing regimen and home exercise.  We discussed viscosupplementation.  Patient will increase activity as tolerated.  Follow-up with me again in 4 to 8 weeks.  Continue conservative therapy otherwise

## 2019-06-05 NOTE — Patient Instructions (Addendum)
Great to see you  The gel injections should help  Will take a bout a month  Continue everything else including vitamin D See me again in 5-6 weeks

## 2019-07-09 ENCOUNTER — Other Ambulatory Visit: Payer: Self-pay | Admitting: Family

## 2019-07-09 MED ORDER — MELOXICAM 15 MG PO TABS: 15.0000 mg | ORAL_TABLET | Freq: Every day | ORAL | 0 refills | Status: DC

## 2019-07-10 ENCOUNTER — Other Ambulatory Visit: Payer: Self-pay

## 2019-07-10 ENCOUNTER — Encounter: Payer: Self-pay | Admitting: Family Medicine

## 2019-07-10 ENCOUNTER — Ambulatory Visit: Payer: BC Managed Care – PPO | Admitting: Family Medicine

## 2019-07-10 VITALS — BP 116/78 | HR 86 | Ht 67.0 in | Wt 293.0 lb

## 2019-07-10 DIAGNOSIS — R109 Unspecified abdominal pain: Secondary | ICD-10-CM | POA: Diagnosis not present

## 2019-07-10 DIAGNOSIS — M170 Bilateral primary osteoarthritis of knee: Secondary | ICD-10-CM | POA: Diagnosis not present

## 2019-07-10 NOTE — Patient Instructions (Signed)
Good to see you.  Referral put in for general surgery pennsaid pinkie amount topically 2 times daily as needed.  See me again in 6-8 weeks

## 2019-07-10 NOTE — Progress Notes (Signed)
[facility] [address] [place], [place] [address] Phone: [phone] Subjective:    I'm seeing this patient by the request  of:    CC: Bilateral knee pain follow-up  DTO:[id number]   06/05/2019 Failed conservative therapy.  Discussed with patient and icing regimen and home exercise.  We discussed viscosupplementation.  Patient will increase activity as tolerated.  Follow-up with me again in 4 to 8 weeks.  Continue conservative therapy otherwise  07/10/2019 Courtney Bates is a 53 y.o. female coming in with complaint of bilateral knee pain. States her her knees are painful today. Left is worse.  Patient states that unfortunately the viscosupplementation did not seem to make a significant improvement.  Patient states was doing very well and then multiple days ago started having increasing inflammation again.  Patient states that the pain is back to her regular state.  Unable to walk greater than 200 feet without significant amount of pain.     Past Medical History:  Diagnosis Date  . Allergy   . Anemia   . Depression   . Family history of thyroid disease   . Frequent headaches   . GERD (gastroesophageal reflux disease)   . Hypertension   . Hypertensive heart disease    ECHO 01/30/14 with concentric LVH  EF 60% and   . Morbid obesity ([facility])    Past Surgical History:  Procedure Laterality Date  . ganglion cyst  2005   right wrist  . WISDOM TOOTH EXTRACTION     Social History   Socioeconomic History  . Marital status: Single    Spouse name: Not on file  . Number of children: 2  . Years of education: 24  . Highest education level: Not on file  Occupational History  . Occupation: Games developer  . Financial resource strain: Not on file  . Food insecurity    Worry: Not on file    Inability: Not on file  . Transportation needs    Medical: Not on file    Non-medical: Not on file  Tobacco Use  . Smoking status: Never Smoker  . Smokeless  tobacco: Never Used  Substance and Sexual Activity  . Alcohol use: Yes    Comment: once every 2-3 months  . Drug use: No  . Sexual activity: Yes    Birth control/protection: Condom  Lifestyle  . Physical activity    Days per week: Not on file    Minutes per session: Not on file  . Stress: Not on file  Relationships  . Social Herbalist on phone: Not on file    Gets together: Not on file    Attends religious service: Not on file    Active member of club or organization: Not on file    Attends meetings of clubs or organizations: Not on file    Relationship status: Not on file  Other Topics Concern  . 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 Penicillins Rash   Family History  Problem Relation Age of Onset  . Hypertension Mother   . Arthritis Mother   . Other Father        brain tumor  . Heart disease Maternal Grandfather   .  Heart disease Paternal Grandmother   . Heart disease Paternal Grandfather   . Colon cancer Other   . Esophageal cancer Neg Hx   . Pancreatic cancer Neg Hx   . Prostate cancer Neg Hx   . Rectal cancer Neg Hx   . Stomach cancer Neg Hx      Current Outpatient Medications (Cardiovascular):  .  amLODipine (NORVASC) 10 MG tablet, Take 1 tablet (10 mg total) by mouth daily. Marland Kitchen.  losartan-hydrochlorothiazide (HYZAAR) 100-25 MG tablet, Take 1 tablet by mouth daily. .  metoprolol tartrate (LOPRESSOR) 25 MG tablet, Take 1 tablet (25 mg total) by mouth 2 (two) times daily.  Current Outpatient Medications (Respiratory):  .  fluticasone (FLONASE) 50 MCG/ACT nasal spray, Place 2 sprays into both nostrils daily. .  VENTOLIN HFA 108 (90 Base) MCG/ACT inhaler, TAKE 2 PUFFS BY MOUTH EVERY 4 HOURS AS NEEDED  Current Outpatient Medications (Analgesics):   .  meloxicam (MOBIC) 15 MG tablet, Take 1 tablet (15 mg total) by mouth daily.   Current Outpatient Medications (Other):  Marland Kitchen.  Diclofenac Sodium 2 % SOLN, Place 2 g onto the skin 2 (two) times daily. .  Omega-3 Fatty Acids (FISH OIL) 1000 MG CAPS, Take by mouth. .  pantoprazole (PROTONIX) 40 MG tablet, Take 1 tablet (40 mg total) by mouth daily. Marland Kitchen.  venlafaxine XR (EFFEXOR-XR) 75 MG 24 hr capsule, Take 1 capsule (75 mg total) by mouth daily. .  Vitamin D, Ergocalciferol, (DRISDOL) 1.25 MG (50000 UT) CAPS capsule, Take 1 capsule (50,000 Units total) by mouth every 7 (seven) days for 12 doses.    Past medical history, social, surgical and family history all reviewed in electronic medical record.  No pertanent information unless stated regarding to the chief complaint.   Review of Systems:  No headache, visual changes, nausea, vomiting, diarrhea, constipation, dizziness, abdominal pain, skin rash, fevers, chills, night sweats, weight loss, swollen lymph nodes, , chest pain, shortness of breath, mood changes.  Positive body aches, joint swelling, muscle aches  Objective  Blood pressure 116/78, pulse 86, height 5\' 7"  (1.702 m), weight 293 lb (132.9 kg), SpO2 95 %.    General: No apparent distress alert and oriented x3 mood and affect normal, dressed appropriately.  Morbidly obese  HEENT: Pupils equal, extraocular movements intact  Respiratory: Patient's speak in full sentences and does not appear short of breath  Cardiovascular: Trace lower extremity edema, non tender, no erythema  Skin: Warm dry intact with no signs of infection or rash on extremities or on axial skeleton.  Abdomen: Soft nontender obese Neuro: Cranial nerves II through XII are intact, neurovascularly intact in all extremities with 2+ DTRs and 2+ pulses.  Lymph: No lymphadenopathy of posterior or anterior cervical chain or axillae bilaterally.  Gait n antalgic MSK:  tender with full range of motion and good stability and  symmetric strength and tone of shoulders, elbows, wrist, hip and ankles bilaterally.       Impression and Recommendations:     This case required medical decision making of moderate complexity. The above documentation has been reviewed and is accurate and complete Judi SaaZachary M Kyo Cocuzza, DO       Note: This dictation was prepared with Dragon dictation along with smaller phrase technology. Any transcriptional errors that result from this process are unintentional.

## 2019-07-10 NOTE — Assessment & Plan Note (Signed)
Patient has failed all conservative therapy including viscosupplementation at this point.  We discussed with patient in great length about this.  Patient wants to consider surgical intervention but she is only 53 years old.  Due to patient's weight though I think this is contributing to most of her aches and pains and patient wants to consider the possibility of gastric bypass surgery and will be referred accordingly.  We did discuss different diet and changes that could be beneficial.  Patient can follow-up with me every 10 weeks for steroid injections if necessary.

## 2019-07-19 ENCOUNTER — Other Ambulatory Visit: Payer: Self-pay | Admitting: Family

## 2019-08-21 ENCOUNTER — Ambulatory Visit: Payer: BC Managed Care – PPO | Admitting: Family Medicine

## 2019-08-24 ENCOUNTER — Encounter: Payer: Self-pay | Admitting: Family

## 2019-08-24 ENCOUNTER — Other Ambulatory Visit: Payer: Self-pay

## 2019-08-24 ENCOUNTER — Ambulatory Visit (INDEPENDENT_AMBULATORY_CARE_PROVIDER_SITE_OTHER): Payer: BC Managed Care – PPO | Admitting: Family

## 2019-08-24 VITALS — BP 130/74 | HR 97 | Temp 98.1°F | Ht 67.0 in | Wt 296.0 lb

## 2019-08-24 DIAGNOSIS — G5601 Carpal tunnel syndrome, right upper limb: Secondary | ICD-10-CM

## 2019-08-24 DIAGNOSIS — M25531 Pain in right wrist: Secondary | ICD-10-CM | POA: Diagnosis not present

## 2019-08-24 MED ORDER — GABAPENTIN 100 MG PO CAPS: 100.0000 mg | ORAL_CAPSULE | Freq: Every day | ORAL | 1 refills | Status: DC

## 2019-08-24 MED ORDER — PREDNISONE 20 MG PO TABS: 40.0000 mg | ORAL_TABLET | Freq: Every day | ORAL | 0 refills | Status: DC

## 2019-08-24 NOTE — Progress Notes (Signed)
Courtney Bates is a 53 y.o. female with the following history as recorded in [facility]:  Patient Active Problem List   Diagnosis Date Noted  . Degenerative arthritis of knee, bilateral 05/15/2019  . OA (osteoarthritis) of knee 12/18/2017  . Palpitations 12/18/2017  . Patellar tendinitis of both knees 05/10/2017  . Iron deficiency 05/11/2016  . Routine general medical examination at a health care facility 04/08/2016  . Anxiety and depression 01/12/2016  . Migraine 09/08/2015  . Sleep disturbance 09/08/2015  . LVH (left ventricular hypertrophy) 01/30/2014  . Hypertension   . Morbid obesity ([facility])   . GERD (gastroesophageal reflux disease)     Current Outpatient Medications  Medication Sig Dispense Refill  . amLODipine (NORVASC) 10 MG tablet Take 1 tablet (10 mg total) by mouth daily. 90 tablet 3  . Diclofenac Sodium 2 % SOLN Place 2 g onto the skin 2 (two) times daily. 112 g 3  . fluticasone (FLONASE) 50 MCG/ACT nasal spray Place 2 sprays into both nostrils daily. 16 g 6  . losartan-hydrochlorothiazide (HYZAAR) 100-25 MG tablet Take 1 tablet by mouth daily. 90 tablet 3  . meloxicam (MOBIC) 15 MG tablet Take 1 tablet (15 mg total) by mouth daily. 30 tablet 0  . metoprolol tartrate (LOPRESSOR) 25 MG tablet Take 1 tablet (25 mg total) by mouth 2 (two) times daily. 180 tablet 3  . Omega-3 Fatty Acids (FISH OIL) 1000 MG CAPS Take by mouth.    . pantoprazole (PROTONIX) 40 MG tablet Take 1 tablet (40 mg total) by mouth daily. 90 tablet 3  . venlafaxine XR (EFFEXOR-XR) 75 MG 24 hr capsule Take 1 capsule (75 mg total) by mouth daily. 90 capsule 1  . VENTOLIN HFA 108 (90 Base) MCG/ACT inhaler TAKE 2 PUFFS BY MOUTH EVERY 4 HOURS AS NEEDED    . gabapentin (NEURONTIN) 100 MG capsule Take 1 capsule (100 mg total) by mouth at bedtime. 30 capsule 1  . predniSONE (DELTASONE) 20 MG tablet Take 2 tablets (40 mg total) by mouth daily with breakfast. 10 tablet 0   No current facility-administered  medications for this visit.     Allergies: Penicillins  Past Medical History:  Diagnosis Date  . Allergy   . Anemia   . Depression   . Family history of thyroid disease   . Frequent headaches   . GERD (gastroesophageal reflux disease)   . Hypertension   . Hypertensive heart disease    ECHO 01/30/14 with concentric LVH  EF 60% and   . Morbid obesity ([facility])     Past Surgical History:  Procedure Laterality Date  . ganglion cyst  2005   right wrist  . WISDOM TOOTH EXTRACTION      Family History  Problem Relation Age of Onset  . Hypertension Mother   . Arthritis Mother   . Other Father        brain tumor  . Heart disease Maternal Grandfather   . Heart disease Paternal Grandmother   . Heart disease Paternal Grandfather   . Colon cancer Other   . Esophageal cancer Neg Hx   . Pancreatic cancer Neg Hx   . Prostate cancer Neg Hx   . Rectal cancer Neg Hx   . Stomach cancer Neg Hx     Social History   Tobacco Use  . Smoking status: Never Smoker  . Smokeless tobacco: Never Used  Substance Use Topics  . Alcohol use: Yes    Comment: once every 2-3 months  Subjective:  Right wrist pain x 1 month; works at a job that does require repetitive motion; numbness/ tingling into fingertips; notes that pain is keeping her up at night; has tried icing wrist; has not tried using a wrist splint.    Objective:  Vitals:   08/24/19 1427  BP: 130/74  Pulse: 97  Temp: 98.1 F (36.7 C)  TempSrc: Oral  SpO2: 98%  Weight: 296 lb (134.3 kg)  Height: 5\' 7"  (1.702 m)    General: Well developed, well nourished, in no acute distress  Skin : Warm and dry.  Head: Normocephalic and atraumatic  Lungs: Respirations unlabored;  Musculoskeletal: No deformities; no active joint inflammation  Extremities: No edema, cyanosis, clubbing  Vessels: Symmetric bilaterally  Neurologic: Alert and oriented; speech intact; face symmetrical; moves all extremities well; CNII-XII intact without focal deficit    Assessment:  1. Right wrist pain   2. Carpal tunnel syndrome of right wrist     Plan:  Recommend to apply ice and try using wrist splint; Rx for Prednisone and Gabapentin; refer to orthopedist for further evaluation.   No follow-ups on file.  Orders Placed This Encounter  Procedures  . Ambulatory referral to Orthopedic Surgery    Referral Priority:   Routine    Referral Type:   Surgical    Referral Reason:   Specialty Services Required    Requested Specialty:   Orthopedic Surgery    Number of Visits Requested:   1  . Ambulatory referral to Orthopedic Surgery    Referral Priority:   Routine    Referral Type:   Surgical    Referral Reason:   Specialty Services Required    Requested Specialty:   Orthopedic Surgery    Number of Visits Requested:   1    Requested Prescriptions   Signed Prescriptions Disp Refills  . predniSONE (DELTASONE) 20 MG tablet 10 tablet 0    Sig: Take 2 tablets (40 mg total) by mouth daily with breakfast.  . gabapentin (NEURONTIN) 100 MG capsule 30 capsule 1    Sig: Take 1 capsule (100 mg total) by mouth at bedtime.

## 2019-09-11 ENCOUNTER — Ambulatory Visit (INDEPENDENT_AMBULATORY_CARE_PROVIDER_SITE_OTHER): Payer: BC Managed Care – PPO | Admitting: Orthopaedic Surgery

## 2019-09-11 ENCOUNTER — Encounter: Payer: Self-pay | Admitting: Orthopaedic Surgery

## 2019-09-11 DIAGNOSIS — M79641 Pain in right hand: Secondary | ICD-10-CM

## 2019-09-11 MED ORDER — PREDNISONE 10 MG (21) PO TBPK: ORAL_TABLET | ORAL | 0 refills | Status: DC

## 2019-09-11 NOTE — Progress Notes (Signed)
Office Visit Note   Patient: Courtney Bates           Date of Birth: Jul 16, 1966           MRN: [id number] Visit Date: 09/11/2019              Requested by: Marrian Salvage, [place],  [place] [address] PCP: Marrian Salvage, FNP   Assessment & Plan: Visit Diagnoses:  1. Pain of right hand     Plan: Impression is suspected right carpal tunnel syndrome.  We will obtain nerve conduction studies to assess severity.  In the meantime we will provide her with a carpal tunnel brace to wear at night.  We will see her back once the nerve conduction studies have been completed.  Follow-Up Instructions: Return if symptoms worsen or fail to improve.   Orders:  No orders of the defined types were placed in this encounter.  No orders of the defined types were placed in this encounter.     Procedures: No procedures performed   Clinical Data: No additional findings.   Subjective: Chief Complaint  Patient presents with  . Right Wrist - Pain    Courtney Bates is a very pleasant 53 year old female referred to [place] for evaluation of right hand burning pain and numbness.  She endorses burning pain that wakes her up at night.  She feels like her hand is often falling asleep.  Denies any injuries.  Has not had nerve conduction studies.   Review of Systems  Constitutional: Negative.   HENT: Negative.   Eyes: Negative.   Respiratory: Negative.   Cardiovascular: Negative.   Endocrine: Negative.   Musculoskeletal: Negative.   Neurological: Negative.   Hematological: Negative.   Psychiatric/Behavioral: Negative.   All other systems reviewed and are negative.    Objective: Vital Signs: There were no vitals taken for this visit.  Physical Exam Vitals signs and nursing note reviewed.  Constitutional:      Appearance: She is well-developed.  HENT:     Head: Normocephalic and atraumatic.  Neck:     Musculoskeletal: Neck supple.  Pulmonary:     Effort:  Pulmonary effort is normal.  Abdominal:     Palpations: Abdomen is soft.  Skin:    General: Skin is warm.     Capillary Refill: Capillary refill takes less than 2 seconds.  Neurological:     Mental Status: She is alert and oriented to person, place, and time.  Psychiatric:        Behavior: Behavior normal.        Thought Content: Thought content normal.        Judgment: Judgment normal.     Ortho Exam  Specialty Comments:  No specialty comments available.  Imaging: No results found.   PMFS History: Patient Active Problem List   Diagnosis Date Noted  . Degenerative arthritis of knee, bilateral 05/15/2019  . OA (osteoarthritis) of knee 12/18/2017  . Palpitations 12/18/2017  . Patellar tendinitis of both knees 05/10/2017  . Iron deficiency 05/11/2016  . Routine general medical examination at a health care facility 04/08/2016  . Anxiety and depression 01/12/2016  . Migraine 09/08/2015  . Sleep disturbance 09/08/2015  . LVH (left ventricular hypertrophy) 01/30/2014  . Hypertension   . Morbid obesity ([facility])   . GERD (gastroesophageal reflux disease)    Past Medical History:  Diagnosis Date  . Allergy   . Anemia   . Depression   . Family history  of thyroid disease   . Frequent headaches   . GERD (gastroesophageal reflux disease)   . Hypertension   . Hypertensive heart disease    ECHO 01/30/14 with concentric LVH  EF 60% and   . Morbid obesity ([facility])     Family History  Problem Relation Age of Onset  . Hypertension Mother   . Arthritis Mother   . Other Father        brain tumor  . Heart disease Maternal Grandfather   . Heart disease Paternal Grandmother   . Heart disease Paternal Grandfather   . Colon cancer Other   . Esophageal cancer Neg Hx   . Pancreatic cancer Neg Hx   . Prostate cancer Neg Hx   . Rectal cancer Neg Hx   . Stomach cancer Neg Hx     Past Surgical History:  Procedure Laterality Date  . ganglion cyst  2005   right wrist  . WISDOM TOOTH  EXTRACTION     Social History   Occupational History  . Occupation: Geographical information systems officer  Tobacco Use  . Smoking status: Never Smoker  . Smokeless tobacco: Never Used  Substance and Sexual Activity  . Alcohol use: Yes    Comment: once every 2-3 months  . Drug use: No  . Sexual activity: Yes    Birth control/protection: Condom

## 2019-09-26 DIAGNOSIS — Z1231 Encounter for screening mammogram for malignant neoplasm of breast: Secondary | ICD-10-CM | POA: Diagnosis not present

## 2019-09-26 LAB — HM MAMMOGRAPHY

## 2019-10-09 ENCOUNTER — Encounter: Payer: BC Managed Care – PPO | Admitting: Physical Medicine and Rehabilitation

## 2019-10-11 ENCOUNTER — Encounter: Payer: Self-pay | Admitting: Family

## 2019-10-11 NOTE — Progress Notes (Signed)
error 

## 2019-10-11 NOTE — Progress Notes (Signed)
Outside notes received. Information abstracted. Notes sent to scan.  

## 2019-10-24 ENCOUNTER — Other Ambulatory Visit: Payer: Self-pay

## 2019-10-24 ENCOUNTER — Encounter: Payer: Self-pay | Admitting: Physical Medicine and Rehabilitation

## 2019-10-24 ENCOUNTER — Ambulatory Visit (INDEPENDENT_AMBULATORY_CARE_PROVIDER_SITE_OTHER): Payer: BC Managed Care – PPO | Admitting: Physical Medicine and Rehabilitation

## 2019-10-24 DIAGNOSIS — R202 Paresthesia of skin: Secondary | ICD-10-CM | POA: Diagnosis not present

## 2019-10-24 NOTE — Progress Notes (Signed)
 .  Numeric Pain Rating Scale and Functional Assessment Average Pain 8   In the last MONTH (on 0-10 scale) has pain interfered with the following?  1. General activity like being  able to carry out your everyday physical activities such as walking, climbing stairs, carrying groceries, or moving a chair?  Rating(8)   

## 2019-10-25 NOTE — Progress Notes (Signed)
Courtney Bates - 53 y.o. female MRN [id number]  Date of birth: [birth year]  Office Visit Note: Visit Date: 10/24/2019 PCP: Olive BassMurray, Laura Woodruff, FNP Referred by: Olive BassMurray, Laura Woodruff,*  Subjective: Chief Complaint  Patient presents with  . Right Elbow - Pain, Numbness, Tingling  . Right Forearm - Tingling, Pain, Numbness  . Right Hand - Pain, Numbness, Tingling   HPI: Courtney Bates is a 53 y.o. female who comes in today At the request of Dr. Glee ArvinMichael Xu for electrodiagnostic study of the right upper limb.  Patient's been having 4 months of progressive worsening pain numbness and tingling particularly in the right elbow right forearm and right hand thumb index and fifth digit.  Interesting distribution somewhat related to the median nerve but also fifth digit.  She reports worsening with any activity using her right hand.  Activity does seem to make her symptoms worse.  She is really found nothing that helps with her symptoms at all.  She denies any frank neck pain or radicular pain.  She denies any left-sided complaints.  No history of diabetes or neuropathy.  She has started using gabapentin and she has had prednisone taper without results.  She has had no prior electrodiagnostic study.  ROS Otherwise per HPI.  Assessment & Plan: Visit Diagnoses:  1. Paresthesia of skin     Plan: Impression:  Essentially NORMAL electrodiagnostic study of the right upper limb.     There is no significant electrodiagnostic evidence of nerve entrapment, brachial plexopathy or cervical radiculopathy.    As you know, purely sensory or demyelinating radiculopathies and chemical radiculitis may not be detected with this particular electrodiagnostic study.  Recommendations: 1.  Follow-up with referring physician. 2.  Continue current management of symptoms.   Meds & Orders: No orders of the defined types were placed in this encounter.   Orders Placed This Encounter  Procedures  . NCV with EMG  (electromyography)    Follow-up: Return for  Glee ArvinMichael Xu, M.D..   Procedures: No procedures performed  EMG & NCV Findings: Evaluation of the right ulnar sensory nerve showed reduced amplitude (8.2 V).  All remaining nerves (as indicated in the following tables) were within normal limits.    All examined muscles (as indicated in the following table) showed no evidence of electrical instability.    Impression:  Essentially NORMAL electrodiagnostic study of the right upper limb.     There is no significant electrodiagnostic evidence of nerve entrapment, brachial plexopathy or cervical radiculopathy.    As you know, purely sensory or demyelinating radiculopathies and chemical radiculitis may not be detected with this particular electrodiagnostic study.  Recommendations: 1.  Follow-up with referring physician. 2.  Continue current management of symptoms.  ___________________________ Naaman PlummerFred Anzel Kearse FAAPMR Board Certified, American Board of Physical Medicine and Rehabilitation    Nerve Conduction Studies Anti Sensory Summary Table   Stim Site NR Peak (ms) Norm Peak (ms) P-T Amp (V) Norm P-T Amp Site1 Site2 Delta-P (ms) Dist (cm) Vel (m/s) Norm Vel (m/s)  Right Median Acr Palm Anti Sensory (2nd Digit)  31.7C  Wrist    3.3 <3.6 22.1 >10 Wrist Palm 1.5 0.0    Palm    1.8 <2.0 10.9         Right Radial Anti Sensory (Base 1st Digit)  31.8C  Wrist    2.1 <3.1 17.3  Wrist Base 1st Digit 2.1 0.0    Right Ulnar Anti Sensory (5th Digit)  32C  Wrist  3.2 <3.7 *8.2 >15.0 Wrist 5th Digit 3.2 14.0 44 >38   Motor Summary Table   Stim Site NR Onset (ms) Norm Onset (ms) O-P Amp (mV) Norm O-P Amp Site1 Site2 Delta-0 (ms) Dist (cm) Vel (m/s) Norm Vel (m/s)  Right Median Motor (Abd Poll Brev)  32C  Wrist    3.2 <4.2 10.5 >5 Elbow Wrist 4.0 20.0 50 >50  Elbow    7.2  10.2         Right Ulnar Motor (Abd Dig Min)  32.3C  Wrist    3.2 <4.2 4.6 >3 B Elbow Wrist 3.2 20.0 63 >53  B Elbow    6.4   6.6  A Elbow B Elbow 1.6 11.0 69 >53  A Elbow    8.0  5.2          EMG   Side Muscle Nerve Root Ins Act Fibs Psw Amp Dur Poly Recrt Int Dennie Bible Comment  Right Abd Poll Brev Median C8-T1 Nml Nml Nml Nml Nml 0 Nml Nml   Right 1stDorInt Ulnar C8-T1 Nml Nml Nml Nml Nml 0 Nml Nml   Right PronatorTeres Median C6-7 Nml Nml Nml Nml Nml 0 Nml Nml   Right Biceps Musculocut C5-6 Nml Nml Nml Nml Nml 0 Nml Nml   Right Deltoid Axillary C5-6 Nml Nml Nml Nml Nml 0 Nml Nml     Nerve Conduction Studies Anti Sensory Left/Right Comparison   Stim Site L Lat (ms) R Lat (ms) L-R Lat (ms) L Amp (V) R Amp (V) L-R Amp (%) Site1 Site2 L Vel (m/s) R Vel (m/s) L-R Vel (m/s)  Median Acr Palm Anti Sensory (2nd Digit)  31.7C  Wrist  3.3   22.1  Wrist Palm     Palm  1.8   10.9        Radial Anti Sensory (Base 1st Digit)  31.8C  Wrist  2.1   17.3  Wrist Base 1st Digit     Ulnar Anti Sensory (5th Digit)  32C  Wrist  3.2   *8.2  Wrist 5th Digit  44    Motor Left/Right Comparison   Stim Site L Lat (ms) R Lat (ms) L-R Lat (ms) L Amp (mV) R Amp (mV) L-R Amp (%) Site1 Site2 L Vel (m/s) R Vel (m/s) L-R Vel (m/s)  Median Motor (Abd Poll Brev)  32C  Wrist  3.2   10.5  Elbow Wrist  50   Elbow  7.2   10.2        Ulnar Motor (Abd Dig Min)  32.3C  Wrist  3.2   4.6  B Elbow Wrist  63   B Elbow  6.4   6.6  A Elbow B Elbow  69   A Elbow  8.0   5.2           Waveforms:            Clinical History: No specialty comments available.   She reports that she has never smoked. She has never used smokeless tobacco.  Recent Labs    05/04/19 1110 06/01/19 1606  HGBA1C 5.2  --   LABURIC  --  4.7    Objective:  VS:  HT:    WT:   BMI:     BP:   HR: bpm  TEMP: ( )  RESP:  Physical Exam Musculoskeletal:        General: No swelling, tenderness or deformity.     Comments: Inspection reveals no atrophy of the  bilateral APB or FDI or hand intrinsics. There is no swelling, color changes, allodynia or dystrophic  changes. There is 5 out of 5 strength in the bilateral wrist extension, finger abduction and long finger flexion. There is intact sensation to light touch in all dermatomal and peripheral nerve distributions.  There is a negative Phalen's test bilaterally. There is a negative Hoffmann's test bilaterally.  Skin:    General: Skin is warm and dry.     Findings: No erythema or rash.  Neurological:     General: No focal deficit present.     Mental Status: She is alert and oriented to person, place, and time.     Motor: No weakness or abnormal muscle tone.     Coordination: Coordination normal.  Psychiatric:        Mood and Affect: Mood normal.        Behavior: Behavior normal.     Ortho Exam Imaging: No results found.  Past Medical/Family/Surgical/Social History: Medications & Allergies reviewed per EMR, new medications updated. Patient Active Problem List   Diagnosis Date Noted  . Degenerative arthritis of knee, bilateral 05/15/2019  . OA (osteoarthritis) of knee 12/18/2017  . Palpitations 12/18/2017  . Patellar tendinitis of both knees 05/10/2017  . Iron deficiency 05/11/2016  . Routine general medical examination at a health care facility 04/08/2016  . Anxiety and depression 01/12/2016  . Migraine 09/08/2015  . Sleep disturbance 09/08/2015  . LVH (left ventricular hypertrophy) 01/30/2014  . Hypertension   . Morbid obesity ([place])   . GERD (gastroesophageal reflux disease)    Past Medical History:  Diagnosis Date  . Allergy   . Anemia   . Depression   . Family history of thyroid disease   . Frequent headaches   . GERD (gastroesophageal reflux disease)   . Hypertension   . Hypertensive heart disease    ECHO 01/30/14 with concentric LVH  EF 60% and   . Morbid obesity ([place])    Family History  Problem Relation Age of Onset  . Hypertension Mother   . Arthritis Mother   . Other Father        brain tumor  . Heart disease Maternal Grandfather   . Heart disease Paternal  Grandmother   . Heart disease Paternal Grandfather   . Colon cancer Other   . Esophageal cancer Neg Hx   . Pancreatic cancer Neg Hx   . Prostate cancer Neg Hx   . Rectal cancer Neg Hx   . Stomach cancer Neg Hx    Past Surgical History:  Procedure Laterality Date  . ganglion cyst  2005   right wrist  . WISDOM TOOTH EXTRACTION     Social History   Occupational History  . Occupation: Insurance account manager  Tobacco Use  . Smoking status: Never Smoker  . Smokeless tobacco: Never Used  Substance and Sexual Activity  . Alcohol use: Yes    Comment: once every 2-3 months  . Drug use: No  . Sexual activity: Yes    Birth control/protection: Condom

## 2019-10-26 NOTE — Procedures (Signed)
EMG & NCV Findings: Evaluation of the right ulnar sensory nerve showed reduced amplitude (8.2 V).  All remaining nerves (as indicated in the following tables) were within normal limits.    All examined muscles (as indicated in the following table) showed no evidence of electrical instability.    Impression:  Essentially NORMAL electrodiagnostic study of the right upper limb.     There is no significant electrodiagnostic evidence of nerve entrapment, brachial plexopathy or cervical radiculopathy.    As you know, purely sensory or demyelinating radiculopathies and chemical radiculitis may not be detected with this particular electrodiagnostic study.  Recommendations: 1.  Follow-up with referring physician. 2.  Continue current management of symptoms.  ___________________________ Laurence Spates FAAPMR Board Certified, American Board of Physical Medicine and Rehabilitation    Nerve Conduction Studies Anti Sensory Summary Table   Stim Site NR Peak (ms) Norm Peak (ms) P-T Amp (V) Norm P-T Amp Site1 Site2 Delta-P (ms) Dist (cm) Vel (m/s) Norm Vel (m/s)  Right Median Acr Palm Anti Sensory (2nd Digit)  31.7C  Wrist    3.3 <3.6 22.1 >10 Wrist Palm 1.5 0.0    Palm    1.8 <2.0 10.9         Right Radial Anti Sensory (Base 1st Digit)  31.8C  Wrist    2.1 <3.1 17.3  Wrist Base 1st Digit 2.1 0.0    Right Ulnar Anti Sensory (5th Digit)  32C  Wrist    3.2 <3.7 *8.2 >15.0 Wrist 5th Digit 3.2 14.0 44 >38   Motor Summary Table   Stim Site NR Onset (ms) Norm Onset (ms) O-P Amp (mV) Norm O-P Amp Site1 Site2 Delta-0 (ms) Dist (cm) Vel (m/s) Norm Vel (m/s)  Right Median Motor (Abd Poll Brev)  32C  Wrist    3.2 <4.2 10.5 >5 Elbow Wrist 4.0 20.0 50 >50  Elbow    7.2  10.2         Right Ulnar Motor (Abd Dig Min)  32.3C  Wrist    3.2 <4.2 4.6 >3 B Elbow Wrist 3.2 20.0 63 >53  B Elbow    6.4  6.6  A Elbow B Elbow 1.6 11.0 69 >53  A Elbow    8.0  5.2          EMG   Side Muscle Nerve Root Ins  Act Fibs Psw Amp Dur Poly Recrt Int Fraser Din Comment  Right Abd Poll Brev Median C8-T1 Nml Nml Nml Nml Nml 0 Nml Nml   Right 1stDorInt Ulnar C8-T1 Nml Nml Nml Nml Nml 0 Nml Nml   Right PronatorTeres Median C6-7 Nml Nml Nml Nml Nml 0 Nml Nml   Right Biceps Musculocut C5-6 Nml Nml Nml Nml Nml 0 Nml Nml   Right Deltoid Axillary C5-6 Nml Nml Nml Nml Nml 0 Nml Nml     Nerve Conduction Studies Anti Sensory Left/Right Comparison   Stim Site L Lat (ms) R Lat (ms) L-R Lat (ms) L Amp (V) R Amp (V) L-R Amp (%) Site1 Site2 L Vel (m/s) R Vel (m/s) L-R Vel (m/s)  Median Acr Palm Anti Sensory (2nd Digit)  31.7C  Wrist  3.3   22.1  Wrist Palm     Palm  1.8   10.9        Radial Anti Sensory (Base 1st Digit)  31.8C  Wrist  2.1   17.3  Wrist Base 1st Digit     Ulnar Anti Sensory (5th Digit)  32C  Wrist  3.2   *  8.2  Wrist 5th Digit  44    Motor Left/Right Comparison   Stim Site L Lat (ms) R Lat (ms) L-R Lat (ms) L Amp (mV) R Amp (mV) L-R Amp (%) Site1 Site2 L Vel (m/s) R Vel (m/s) L-R Vel (m/s)  Median Motor (Abd Poll Brev)  32C  Wrist  3.2   10.5  Elbow Wrist  50   Elbow  7.2   10.2        Ulnar Motor (Abd Dig Min)  32.3C  Wrist  3.2   4.6  B Elbow Wrist  63   B Elbow  6.4   6.6  A Elbow B Elbow  69   A Elbow  8.0   5.2           Waveforms:

## 2019-10-30 ENCOUNTER — Other Ambulatory Visit: Payer: Self-pay

## 2019-10-30 ENCOUNTER — Ambulatory Visit: Payer: BC Managed Care – PPO | Admitting: Orthopaedic Surgery

## 2019-10-30 ENCOUNTER — Encounter: Payer: Self-pay | Admitting: Orthopaedic Surgery

## 2019-10-30 DIAGNOSIS — M79641 Pain in right hand: Secondary | ICD-10-CM

## 2019-10-30 MED ORDER — LIDOCAINE HCL 1 % IJ SOLN
1.0000 mL | INTRAMUSCULAR | Status: AC | PRN
  Administered 2019-10-30: 1 mL

## 2019-10-30 MED ORDER — BUPIVACAINE HCL 0.5 % IJ SOLN
1.0000 mL | INTRAMUSCULAR | Status: AC | PRN
  Administered 2019-10-30: 1 mL

## 2019-10-30 MED ORDER — METHYLPREDNISOLONE ACETATE 40 MG/ML IJ SUSP
40.0000 mg | INTRAMUSCULAR | Status: AC | PRN
  Administered 2019-10-30: 40 mg

## 2019-10-30 NOTE — Progress Notes (Signed)
Office Visit Note   Patient: Courtney Bates           Date of Birth: [birth year]           MRN: [id number] Visit Date: 10/30/2019              Requested by: Olive Bass, FNP [address] [place],  [place] [address] PCP: Olive Bass, FNP   Assessment & Plan: Visit Diagnoses:  1. Pain of right hand     Plan: Nerve conduction studies were normal.  Patient may have occult right carpal tunnel syndrome neuropathy.  Her forearm wrist and elbow exams are unremarkable.  Based on discussion patient would like to try a carpal tunnel injection which will hopefully therapeutic and diagnostic.  She will continue to wear braces at night which do sound like they help.  If she does not receive any relief from this injection we will need to refer her to neurology.  Follow-Up Instructions: Return if symptoms worsen or fail to improve.   Orders:  No orders of the defined types were placed in this encounter.  No orders of the defined types were placed in this encounter.     Procedures: Hand/UE Inj: R carpal tunnel for carpal tunnel syndrome on 10/30/2019 6:03 PM Indications: pain Details: 25 G needle Medications: 1 mL lidocaine 1 %; 1 mL bupivacaine 0.5 %; 40 mg methylPREDNISolone acetate 40 MG/ML Outcome: tolerated well, no immediate complications Patient was prepped and draped in the usual sterile fashion.       Clinical Data: No additional findings.   Subjective: Chief Complaint  Patient presents with  . Right Hand - Pain    Patient returns today for follow-up of right hand soreness and numbness.  Her nerve conduction studies were normal.  She states that she has some numbness in her small finger, thumb, index finger.  She states that it is worse at night after work.   Review of Systems  Constitutional: Negative.   HENT: Negative.   Eyes: Negative.   Respiratory: Negative.   Cardiovascular: Negative.   Endocrine: Negative.   Musculoskeletal:  Negative.   Neurological: Negative.   Hematological: Negative.   Psychiatric/Behavioral: Negative.   All other systems reviewed and are negative.    Objective: Vital Signs: There were no vitals taken for this visit.  Physical Exam Vitals signs and nursing note reviewed.  Constitutional:      Appearance: She is well-developed.  Pulmonary:     Effort: Pulmonary effort is normal.  Skin:    General: Skin is warm.     Capillary Refill: Capillary refill takes less than 2 seconds.  Neurological:     Mental Status: She is alert and oriented to person, place, and time.  Psychiatric:        Behavior: Behavior normal.        Thought Content: Thought content normal.        Judgment: Judgment normal.     Ortho Exam Right hand exam shows no muscle atrophy.  Negative carpal tunnel compressive signs. Specialty Comments:  No specialty comments available.  Imaging: No results found.   PMFS History: Patient Active Problem List   Diagnosis Date Noted  . Degenerative arthritis of knee, bilateral 05/15/2019  . OA (osteoarthritis) of knee 12/18/2017  . Palpitations 12/18/2017  . Patellar tendinitis of both knees 05/10/2017  . Iron deficiency 05/11/2016  . Routine general medical examination at a health care facility 04/08/2016  . Anxiety and depression  01/12/2016  . Migraine 09/08/2015  . Sleep disturbance 09/08/2015  . LVH (left ventricular hypertrophy) 01/30/2014  . Hypertension   . Morbid obesity ([place])   . GERD (gastroesophageal reflux disease)    Past Medical History:  Diagnosis Date  . Allergy   . Anemia   . Depression   . Family history of thyroid disease   . Frequent headaches   . GERD (gastroesophageal reflux disease)   . Hypertension   . Hypertensive heart disease    ECHO 01/30/14 with concentric LVH  EF 60% and   . Morbid obesity ([place])     Family History  Problem Relation Age of Onset  . Hypertension Mother   . Arthritis Mother   . Other Father        brain  tumor  . Heart disease Maternal Grandfather   . Heart disease Paternal Grandmother   . Heart disease Paternal Grandfather   . Colon cancer Other   . Esophageal cancer Neg Hx   . Pancreatic cancer Neg Hx   . Prostate cancer Neg Hx   . Rectal cancer Neg Hx   . Stomach cancer Neg Hx     Past Surgical History:  Procedure Laterality Date  . ganglion cyst  2005   right wrist  . WISDOM TOOTH EXTRACTION     Social History   Occupational History  . Occupation: Insurance account manager  Tobacco Use  . Smoking status: Never Smoker  . Smokeless tobacco: Never Used  Substance and Sexual Activity  . Alcohol use: Yes    Comment: once every 2-3 months  . Drug use: No  . Sexual activity: Yes    Birth control/protection: Condom

## 2019-11-14 ENCOUNTER — Encounter: Payer: Self-pay | Admitting: Women's Health

## 2019-11-14 ENCOUNTER — Other Ambulatory Visit: Payer: Self-pay

## 2019-11-14 ENCOUNTER — Ambulatory Visit (INDEPENDENT_AMBULATORY_CARE_PROVIDER_SITE_OTHER): Payer: BC Managed Care – PPO | Admitting: Women's Health

## 2019-11-14 VITALS — BP 125/86 | Ht 66.0 in | Wt 294.8 lb

## 2019-11-14 DIAGNOSIS — Z01419 Encounter for gynecological examination (general) (routine) without abnormal findings: Secondary | ICD-10-CM | POA: Diagnosis not present

## 2019-11-14 NOTE — Patient Instructions (Addendum)
It was good to see you today Vitamin D 2000 daily   Health Maintenance for Postmenopausal Women Menopause is a normal process in which your ability to get pregnant comes to an end. This process happens slowly over many months or years, usually between the ages of 48 and 55. Menopause is complete when you have missed your menstrual periods for 12 months. It is important to talk with your health care provider about some of the most common conditions that affect women after menopause (postmenopausal women). These include heart disease, cancer, and bone loss (osteoporosis). Adopting a healthy lifestyle and getting preventive care can help to promote your health and wellness. The actions you take can also lower your chances of developing some of these common conditions. What should I know about menopause? During menopause, you may get a number of symptoms, such as:  Hot flashes. These can be moderate or severe.  Night sweats.  Decrease in sex drive.  Mood swings.  Headaches.  Tiredness.  Irritability.  Memory problems.  Insomnia. Choosing to treat or not to treat these symptoms is a decision that you make with your health care provider. Do I need hormone replacement therapy?  Hormone replacement therapy is effective in treating symptoms that are caused by menopause, such as hot flashes and night sweats.  Hormone replacement carries certain risks, especially as you become older. If you are thinking about using estrogen or estrogen with progestin, discuss the benefits and risks with your health care provider. What is my risk for heart disease and stroke? The risk of heart disease, heart attack, and stroke increases as you age. One of the causes may be a change in the body's hormones during menopause. This can affect how your body uses dietary fats, triglycerides, and cholesterol. Heart attack and stroke are medical emergencies. There are many things that you can do to help prevent heart  disease and stroke. Watch your blood pressure  High blood pressure causes heart disease and increases the risk of stroke. This is more likely to develop in people who have high blood pressure readings, are of African descent, or are overweight.  Have your blood pressure checked: ? Every 3-5 years if you are 18-39 years of age. ? Every year if you are 40 years old or older. Eat a healthy diet   Eat a diet that includes plenty of vegetables, fruits, low-fat dairy products, and lean protein.  Do not eat a lot of foods that are high in solid fats, added sugars, or sodium. Get regular exercise Get regular exercise. This is one of the most important things you can do for your health. Most adults should:  Try to exercise for at least 150 minutes each week. The exercise should increase your heart rate and make you sweat (moderate-intensity exercise).  Try to do strengthening exercises at least twice each week. Do these in addition to the moderate-intensity exercise.  Spend less time sitting. Even light physical activity can be beneficial. Other tips  Work with your health care provider to achieve or maintain a healthy weight.  Do not use any products that contain nicotine or tobacco, such as cigarettes, e-cigarettes, and chewing tobacco. If you need help quitting, ask your health care provider.  Know your numbers. Ask your health care provider to check your cholesterol and your blood sugar (glucose). Continue to have your blood tested as directed by your health care provider. Do I need screening for cancer? Depending on your health history and family history, you   may need to have cancer screening at different stages of your life. This may include screening for:  Breast cancer.  Cervical cancer.  Lung cancer.  Colorectal cancer. What is my risk for osteoporosis? After menopause, you may be at increased risk for osteoporosis. Osteoporosis is a condition in which bone destruction happens  more quickly than new bone creation. To help prevent osteoporosis or the bone fractures that can happen because of osteoporosis, you may take the following actions:  If you are 19-50 years old, get at least 1,000 mg of calcium and at least 600 mg of vitamin D per day.  If you are older than age 50 but younger than age 70, get at least 1,200 mg of calcium and at least 600 mg of vitamin D per day.  If you are older than age 70, get at least 1,200 mg of calcium and at least 800 mg of vitamin D per day. Smoking and drinking excessive alcohol increase the risk of osteoporosis. Eat foods that are rich in calcium and vitamin D, and do weight-bearing exercises several times each week as directed by your health care provider. How does menopause affect my mental health? Depression may occur at any age, but it is more common as you become older. Common symptoms of depression include:  Low or sad mood.  Changes in sleep patterns.  Changes in appetite or eating patterns.  Feeling an overall lack of motivation or enjoyment of activities that you previously enjoyed.  Frequent crying spells. Talk with your health care provider if you think that you are experiencing depression. General instructions See your health care provider for regular wellness exams and vaccines. This may include:  Scheduling regular health, dental, and eye exams.  Getting and maintaining your vaccines. These include: ? Influenza vaccine. Get this vaccine each year before the flu season begins. ? Pneumonia vaccine. ? Shingles vaccine. ? Tetanus, diphtheria, and pertussis (Tdap) booster vaccine. Your health care provider may also recommend other immunizations. Tell your health care provider if you have ever been abused or do not feel safe at home. Summary  Menopause is a normal process in which your ability to get pregnant comes to an end.  This condition causes hot flashes, night sweats, decreased interest in sex, mood  swings, headaches, or lack of sleep.  Treatment for this condition may include hormone replacement therapy.  Take actions to keep yourself healthy, including exercising regularly, eating a healthy diet, watching your weight, and checking your blood pressure and blood sugar levels.  Get screened for cancer and depression. Make sure that you are up to date with all your vaccines. This information is not intended to replace advice given to you by your health care provider. Make sure you discuss any questions you have with your health care provider. Document Released: 01/21/2006 Document Revised: 11/22/2018 Document Reviewed: 11/22/2018 [employer]vier Patient Education  2020 [employer]  

## 2019-11-14 NOTE — Progress Notes (Signed)
Courtney Bates May 09, 1966 [id number]    History:    Presents for annual exam.  Last here 2017.  Amenorrhea greater than 1 year, not sexually active in several years denies need for STD screen.  Normal Pap and mammogram history.  Primary care manages labs hypertension, anxiety and depression.  2018 - colonoscopy.  Vitamin D 31.  Past medical history, past surgical history, family history and social history were all reviewed and documented in the EPIC chart.  Sewing in a factory.  Son 53, divorced, is 17-year-old son lives in [place] with his mother, daughter 45 both doing well.  ROS:  A ROS was performed and pertinent positives and negatives are included.  Exam:  Vitals:   11/14/19 1509  BP: 125/86  Weight: 294 lb 12.8 oz (133.7 kg)  Height: 5\' 6"  (1.676 m)   Body mass index is 47.58 kg/m.   General appearance:  Normal Thyroid:  Symmetrical, normal in size, without palpable masses or nodularity. Respiratory  Auscultation:  Clear without wheezing or rhonchi Cardiovascular  Auscultation:  Regular rate, without rubs, murmurs or gallops  Edema/varicosities:  Not grossly evident Abdominal  Soft,nontender, without masses, guarding or rebound.  Liver/spleen:  No organomegaly noted  Hernia:  None appreciated  Skin  Inspection:  Grossly normal   Breasts: Examined lying and sitting.     Right: Without masses, retractions, discharge or axillary adenopathy.     Left: Without masses, retractions, discharge or axillary adenopathy. Gentitourinary   Inguinal/mons:  Normal without inguinal adenopathy  External genitalia:  Normal  BUS/Urethra/Skene's glands:  Normal  Vagina:  Normal  Cervix:  Normal  Uterus:  normal in size, shape and contour.  Midline and mobile  Adnexa/parametria:     Rt: Without masses or tenderness.   Lt: Without masses or tenderness.  Anus and perineum: Normal  Digital rectal exam: Normal sphincter tone without palpated masses or tenderness  Assessment/Plan:  53  y.o. SBF G2 P2 for annual exam with no complaints.  Postmenopausal/no HRT/no bleeding Hypertension, GERD, osteoarthritis, asthma, anxiety/depression -primary care manages labs and meds Obesity  Plan: SBEs, continue annual screening mammogram, calcium rich foods, vitamin D 2000 daily encouraged.  Reviewed importance of increasing exercise, cardio and balance type, decrease calorie/carbs reviewed has gained 20 pounds in the past 3 years.  Low-carb diet such as [employer] or [employer] encouraged.  Instructed to call if any vaginal bleeding.  Self-care, leisure activities encouraged.  Pap with HR HPV typing.    [place], 3:14 PM 11/14/2019

## 2019-11-16 LAB — PAP, TP IMAGING W/ HPV RNA, RFLX HPV TYPE 16,18/45: HPV DNA High Risk: NOT DETECTED

## 2019-11-26 ENCOUNTER — Encounter: Payer: Self-pay | Admitting: *Deleted

## 2020-01-11 ENCOUNTER — Other Ambulatory Visit: Payer: Self-pay | Admitting: Family

## 2020-02-06 DIAGNOSIS — M25569 Pain in unspecified knee: Secondary | ICD-10-CM | POA: Diagnosis not present

## 2020-02-07 ENCOUNTER — Other Ambulatory Visit: Payer: Self-pay | Admitting: Family

## 2020-02-08 ENCOUNTER — Ambulatory Visit: Payer: BLUE CROSS/BLUE SHIELD | Admitting: Family

## 2020-02-08 ENCOUNTER — Encounter: Payer: Self-pay | Admitting: Family

## 2020-02-08 ENCOUNTER — Other Ambulatory Visit: Payer: Self-pay

## 2020-02-08 VITALS — BP 148/86 | HR 94 | Temp 98.2°F | Ht 66.0 in | Wt 303.8 lb

## 2020-02-08 DIAGNOSIS — M25561 Pain in right knee: Secondary | ICD-10-CM

## 2020-02-08 DIAGNOSIS — G8929 Other chronic pain: Secondary | ICD-10-CM | POA: Diagnosis not present

## 2020-02-08 NOTE — Patient Instructions (Signed)
Dr. Denyse Amass will see you on Monday to treat your knee; please go downstairs and they can schedule.

## 2020-02-08 NOTE — Progress Notes (Signed)
Courtney Bates is a 54 y.o. female with the following history as recorded in EpicCare:  Patient Active Problem List   Diagnosis Date Noted  . Degenerative arthritis of knee, bilateral 05/15/2019  . OA (osteoarthritis) of knee 12/18/2017  . Palpitations 12/18/2017  . Patellar tendinitis of both knees 05/10/2017  . Iron deficiency 05/11/2016  . Routine general medical examination at a health care facility 04/08/2016  . Anxiety and depression 01/12/2016  . Migraine 09/08/2015  . Sleep disturbance 09/08/2015  . LVH (left ventricular hypertrophy) 01/30/2014  . Hypertension   . Morbid obesity (HCC)   . GERD (gastroesophageal reflux disease)     Current Outpatient Medications  Medication Sig Dispense Refill  . amLODipine (NORVASC) 10 MG tablet Take 1 tablet (10 mg total) by mouth daily. 90 tablet 3  . Diclofenac Sodium 2 % SOLN Place 2 g onto the skin 2 (two) times daily. 112 g 3  . fluticasone (FLONASE) 50 MCG/ACT nasal spray Place 2 sprays into both nostrils daily. 16 g 6  . losartan-hydrochlorothiazide (HYZAAR) 100-25 MG tablet Take 1 tablet by mouth daily. 90 tablet 3  . meloxicam (MOBIC) 15 MG tablet Take 1 tablet (15 mg total) by mouth daily. 30 tablet 0  . metoprolol tartrate (LOPRESSOR) 25 MG tablet Take 1 tablet (25 mg total) by mouth 2 (two) times daily. 180 tablet 3  . Omega-3 Fatty Acids (FISH OIL) 1000 MG CAPS Take by mouth.    . pantoprazole (PROTONIX) 40 MG tablet Take 1 tablet (40 mg total) by mouth daily. 90 tablet 3  . traMADol (ULTRAM) 50 MG tablet     . venlafaxine XR (EFFEXOR-XR) 75 MG 24 hr capsule Take 1 capsule (75 mg total) by mouth daily. 90 capsule 1  . VENTOLIN HFA 108 (90 Base) MCG/ACT inhaler TAKE 2 PUFFS BY MOUTH EVERY 4 HOURS AS NEEDED     No current facility-administered medications for this visit.    Allergies: Penicillins  Past Medical History:  Diagnosis Date  . Allergy   . Anemia   . Depression   . Family history of thyroid disease   . Frequent  headaches   . GERD (gastroesophageal reflux disease)   . Hypertension   . Hypertensive heart disease    ECHO 01/30/14 with concentric LVH  EF 60% and   . Morbid obesity (HCC)     Past Surgical History:  Procedure Laterality Date  . ganglion cyst  2005   right wrist  . WISDOM TOOTH EXTRACTION      Family History  Problem Relation Age of Onset  . Hypertension Mother   . Arthritis Mother   . Other Father        brain tumor  . Heart disease Maternal Grandfather   . Heart disease Paternal Grandmother   . Heart disease Paternal Grandfather   . Colon cancer Other   . Esophageal cancer Neg Hx   . Pancreatic cancer Neg Hx   . Prostate cancer Neg Hx   . Rectal cancer Neg Hx   . Stomach cancer Neg Hx     Social History   Tobacco Use  . Smoking status: Never Smoker  . Smokeless tobacco: Never Used  Substance Use Topics  . Alcohol use: Yes    Comment: once every 2-3 months    Subjective:  1 week history of right knee pain; went to [facility] and had X-rays; no written report available today; was told that her "knee looked bad." Was given prescription  for Tramadol and just picked that up yesterday;  Does have an orthopedist but prefers to see a different provider. Saw our sports medicine provider about her knees last June but did not follow-up there as recommended- at the time of that visit, viscosupplementation was being discussed.     Objective:  Vitals:   02/08/20 1415  BP: (!) 148/86  Pulse: 94  Temp: 98.2 F (36.8 C)  TempSrc: Oral  SpO2: 97%  Weight: (!) 303 lb 12.8 oz (137.8 kg)  Height: 5\' 6"  (1.676 m)    General: Well developed, well nourished, in no acute distress  Head: Normocephalic and atraumatic  Lungs: Respirations unlabored;  Musculoskeletal: No deformities; no active joint inflammation; suspect joint effusion of right knee Extremities: No edema, cyanosis, clubbing  Vessels: Symmetric bilaterally  Neurologic: Alert and oriented; speech intact; face  symmetrical; moves all extremities well; CNII-XII intact without focal deficit   Assessment:  1. Chronic pain of right knee     Plan:  Will not repeat X-rays since she had this done earlier this week at [facility]; will refer back to sports medicine for injection- unfortunately, patient is seen on a Friday afternoon and cannot get her seen before Monday; She will continue the Tramadol that was prescribed to her by [facility] earlier this week; I did update referral to orthopedics as well- she may need surgical treatment and will let sports medicine make this determination for her.  This visit occurred during the SARS-CoV-2 public health emergency.  Safety protocols were in place, including screening questions prior to the visit, additional usage of staff PPE, and extensive cleaning of exam room while observing appropriate contact time as indicated for disinfecting solutions.     No follow-ups on file.  Orders Placed This Encounter  Procedures  . Ambulatory referral to Orthopedic Surgery    Referral Priority:   Routine    Referral Type:   Surgical    Referral Reason:   Specialty Services Required    Requested Specialty:   Orthopedic Surgery    Number of Visits Requested:   1    Requested Prescriptions    No prescriptions requested or ordered in this encounter

## 2020-02-11 ENCOUNTER — Ambulatory Visit: Payer: BLUE CROSS/BLUE SHIELD | Admitting: Family Medicine

## 2020-02-11 ENCOUNTER — Other Ambulatory Visit: Payer: Self-pay

## 2020-02-11 ENCOUNTER — Ambulatory Visit: Payer: Self-pay

## 2020-02-11 ENCOUNTER — Encounter: Payer: Self-pay | Admitting: Family Medicine

## 2020-02-11 VITALS — BP 150/90 | HR 90 | Ht 66.0 in | Wt 303.0 lb

## 2020-02-11 DIAGNOSIS — G8929 Other chronic pain: Secondary | ICD-10-CM | POA: Diagnosis not present

## 2020-02-11 DIAGNOSIS — M25561 Pain in right knee: Secondary | ICD-10-CM

## 2020-02-11 DIAGNOSIS — M170 Bilateral primary osteoarthritis of knee: Secondary | ICD-10-CM

## 2020-02-11 MED ORDER — HYDROCODONE-ACETAMINOPHEN 5-325 MG PO TABS: 1.0000 | ORAL_TABLET | Freq: Four times a day (QID) | ORAL | 0 refills | Status: DC | PRN

## 2020-02-11 MED ORDER — COLCHICINE 0.6 MG PO TABS: 0.6000 mg | ORAL_TABLET | Freq: Every day | ORAL | 2 refills | Status: DC

## 2020-02-11 NOTE — Progress Notes (Signed)
Subjective:    I'm seeing this patient as a consultation for:  Ria Clock, FNP. Note will be routed back to referring provider/PCP.  CC: R knee pain  I, Molly Weber, LAT, ATC, am serving as scribe for Dr. Clementeen Graham.  HPI: Pt is a 54 y/o female presenting w/ c/o R knee pain worse in the last 2 weeks.  She rates her pain as severe.  Pain is worse with activity.  Pain is been ongoing for years.  She has had trials of steroid injections and most recently last year trial of hyaluronic acid injection.  Additionally she is using diclofenac topically with little benefit.  She has had trials of braces with little benefit.  She is willing to consider total knee replacement at this point.  No radiating pain weakness or numbness distally.  She had x-ray at [facility] urgent care last week and was told that she has significant DJD.    Diagnostic testing: B knee XR- 12/16/17  [facility].    Past medical history, Surgical history, Family history, Social history, Allergies, and medications have been entered into the medical record, reviewed.   Review of Systems: No new headache, visual changes, nausea, vomiting, diarrhea, constipation, dizziness, abdominal pain, skin rash, fevers, chills, night sweats, weight loss, swollen lymph nodes, body aches, joint swelling, muscle aches, chest pain, shortness of breath, mood changes, visual or auditory hallucinations.   Objective:    Vitals:   02/11/20 1432  BP: (!) 150/90  Pulse: 90  SpO2: 97%   General: Well Developed, well nourished, and in no acute distress.  Neuro/Psych: Alert and oriented x3, extra-ocular muscles intact, able to move all 4 extremities, sensation grossly intact. Skin: Warm and dry, no rashes noted.  Respiratory: Not using accessory muscles, speaking in full sentences, trachea midline.  Cardiovascular: Pulses palpable, no extremity edema. Abdomen: Does not appear distended. MSK: Right knee: Moderate effusion  otherwise normal-appearing. Range of motion 5-100 degrees with crepitation. Nontender joint lines. Stable ligaments exam. Intact strength.  Lab and Radiology Results X-ray images obtained from outside urgent care obtained last week personally and independently reviewed. Right knee: Severe DJD with chondrocalcinosis present.  No acute fractures.  Procedure: Real-time Ultrasound Guided Injection of right knee Device: Philips Affiniti 50G Images permanently stored and available for review in the ultrasound unit. Verbal informed consent obtained.  Discussed risks and benefits of procedure. Warned about infection bleeding damage to structures skin hypopigmentation and fat atrophy among others. Patient expresses understanding and agreement Time-out conducted.   Noted no overlying erythema, induration, or other signs of local infection.   Skin prepped in a sterile fashion.   Local anesthesia: Topical Ethyl chloride.   With sterile technique and under real time ultrasound guidance:  40 mg of Kenalog and 3 mL of Marcaine injected easily.   Completed without difficulty   Pain immediately resolved suggesting accurate placement of the medication.   Advised to call if fevers/chills, erythema, induration, drainage, or persistent bleeding.   Images permanently stored and available for review in the ultrasound unit.  Impression: Technically successful ultrasound guided injection.     .   Impression and Recommendations:    Assessment and Plan: 54 y.o. female with right knee pain.  Pain due to predominantly DJD.  Patient may also have a component of chondrocalcinosis.  There is not much left to do for her knee pain due to DJD.  Conservative management is likely to not provide lasting benefit.  I  am not optimistic about alternative strategies including PRP.  Fundamentally she needs a knee replacement.  However she is quite symptomatic at this point.  Plan for steroid injection to buy her some time.   Additionally we will try treating chondrocalcinosis seen on recent x-rays with trial of colchicine.  However most importantly will refer to orthopedic surgery for discussion of total knee replacement.  I do for she 1 potential barrier.  Her BMI is 48.9 and that could potentially hamper her ability to have a total knee replacement.  PDMP reviewed during this encounter. Orders Placed This Encounter  Procedures  . [place] LIMITED JOINT SPACE STRUCTURES LOW RIGHT(NO LINKED CHARGES)    Order Specific Question:   Reason for Exam (SYMPTOM  OR DIAGNOSIS REQUIRED)    Answer:   knee pain and injection    Order Specific Question:   Preferred imaging location?    Answer:   [place]  . Ambulatory referral to Orthopedic Surgery    Referral Priority:   Routine    Referral Type:   Surgical    Referral Reason:   Specialty Services Required    Requested Specialty:   Orthopedic Surgery    Number of Visits Requested:   1   Meds ordered this encounter  Medications  . colchicine 0.6 MG tablet    Sig: Take 1 tablet (0.6 mg total) by mouth daily. For psuedogout    Dispense:  30 tablet    Refill:  2  . HYDROcodone-acetaminophen (NORCO/VICODIN) 5-325 MG tablet    Sig: Take 1 tablet by mouth every 6 (six) hours as needed.    Dispense:  15 tablet    Refill:  0    Discussed warning signs or symptoms. Please see discharge instructions. Patient expresses understanding.   The above documentation has been reviewed and is accurate and complete Lynne Leader

## 2020-02-11 NOTE — Patient Instructions (Addendum)
You had a R knee injection today. Call or go to the ER if you develop a large red swollen joint with extreme pain or oozing puss.   Thank you for coming in today.  I think the main cause of pain is arthritis.  Plan for orthopedic referral for total knee replacement.  Hopefully you will get benefit from the shot.  I am also trying to treat psueogout.  Take colchicine daily as needed.    Calcium Pyrophosphate Deposition Calcium pyrophosphate deposition (CPPD) is a type of arthritis that causes pain, swelling, and inflammation in a joint. Attacks of CPPD may come and go. The joint pain can be severe and may last for days to weeks. This condition usually affects one joint at a time. The knees are most often affected, but this condition can also affect the wrists, elbows, shoulders, or ankles. CPPD may also be called pseudogout because it is similar to gout. Both conditions result from the buildup of crystals in a joint. However, CPPD is caused by a type of crystal that is different from the crystals that cause gout. What are the causes? This condition is caused by the buildup of calcium pyrophosphate dihydrate crystals in a joint. The reason why this buildup occurs is not known. An increased likelihood of having this condition (predisposition) may be passed from parent to child (is hereditary). What increases the risk? This condition is more likely to develop in people who:  Are older than 54 years of age.  Have a family history of CPPD.  Have certain medical conditions, such as hemophilia, amyloidosis, or overactive parathyroid glands.  Have low levels of magnesium in the blood. What are the signs or symptoms? Symptoms of this condition include:  Joint pain. The pain may: ? Be intense and constant. ? Develop quickly. ? Get worse with movement. ? Last from several days to a few weeks.  Redness, swelling, stiffness, and warmth at the joint. How is this diagnosed? To diagnose this  condition, your health care provider will use a needle to remove fluid from the joint. The fluid will be examined for the crystals that cause CPPD. You also may have additional tests, such as:  Blood tests.  X-rays.  Ultrasound.  MRI. How is this treated? There is no way to remove the crystals from the joint and no cure for this condition. However, treatment can relieve symptoms and improve joint function. Treatment may include:  NSAIDs to reduce inflammation and pain.  Removing some of the fluid and crystals from around the joint with a needle.  Injections of medicine (cortisone) into the joint to reduce pain and swelling.  Medicines to help prevent attacks.  Physical therapy to improve joint function. Follow these instructions at home: Managing pain, stiffness, and swelling   Rest the affected joint until your symptoms start to go away.  If directed, put ice on the affected area to relieve pain and swelling: ? Put ice in a plastic bag. ? Place a towel between your skin and the bag. ? Leave the ice on for 20 minutes, 2-3 times a day.  Keep your affected joint raised (elevated) above the level of your heart, when possible. This will help to reduce swelling. For example, prop your foot up on a chair while sitting down to elevate your knee. General instructions  If the painful joint is in your leg, use crutches as told by your health care provider.  Take over-the-counter and prescription medicines only as told by your health  care provider.  When your symptoms start to go away, begin to exercise regularly or do physical therapy. Talk with your health care provider or physical therapist about what types of exercise are safe for you. Exercise that is easier on your joints (low-impact exercise) may be best. This includes walking, swimming, bicycling, and water aerobics.  Maintain a healthy weight. Excess weight puts stress on your joints.  Keep all follow-up visits as told by  your health care provider and physical therapist. This is important. Contact a health care provider if you:  Notice that your symptoms get worse.  Develop a skin rash.  Notice that your pain gets worse. Get help right away if you:  Have a fever.  Have difficulty breathing.  Are taking NSAIDs and you: ? Vomit blood. ? Have blood in your stool. ? Have stool that is tarry and black. Summary  Calcium pyrophosphate deposition (CPPD) is a type of arthritis that causes pain, swelling, and inflammation in a joint. The knees are most often affected, but it can also affect the wrists, elbows, shoulders, or ankles.  CPPD is caused by the buildup of calcium crystals in a joint. The reason why this occurs is not known.  Attacks of CPPD may come and go. The joint pain can be severe and may last for days to weeks.  There is no way to remove the crystals from the joint and no cure for this condition. However, treatment can relieve symptoms and improve joint function.  Rest the affected joint until your symptoms start to go away. After your symptoms go away, begin to exercise regularly or do physical therapy. This information is not intended to replace advice given to you by your health care provider. Make sure you discuss any questions you have with your health care provider. Document Revised: 11/11/2017 Document Reviewed: 09/27/2017 Elsevier Patient Education  2020 [employer].

## 2020-02-12 ENCOUNTER — Other Ambulatory Visit: Payer: Self-pay | Admitting: Family

## 2020-02-12 ENCOUNTER — Telehealth: Payer: Self-pay | Admitting: Family Medicine

## 2020-02-12 MED ORDER — COLCHICINE 0.6 MG PO CAPS: 1.0000 | ORAL_CAPSULE | Freq: Every day | ORAL | 2 refills | Status: DC | PRN

## 2020-02-12 MED ORDER — MELOXICAM 15 MG PO TABS: 15.0000 mg | ORAL_TABLET | Freq: Every day | ORAL | 0 refills | Status: DC

## 2020-02-12 NOTE — Telephone Encounter (Signed)
Received a fax from [facility] pharmacy requesting callback.  I contacted the pharmacist assistant who stated that the patient's insurance prefers the colchicine capsules.  Colchicine capsule prescription sent in instead.  Pharmacy will contact the patient to notify her that the prescription is ready.

## 2020-02-19 ENCOUNTER — Other Ambulatory Visit: Payer: Self-pay

## 2020-02-19 ENCOUNTER — Ambulatory Visit: Payer: BLUE CROSS/BLUE SHIELD | Admitting: Orthopaedic Surgery

## 2020-02-19 ENCOUNTER — Encounter: Payer: Self-pay | Admitting: Orthopaedic Surgery

## 2020-02-19 ENCOUNTER — Ambulatory Visit: Payer: Self-pay

## 2020-02-19 DIAGNOSIS — M1711 Unilateral primary osteoarthritis, right knee: Secondary | ICD-10-CM | POA: Diagnosis not present

## 2020-02-19 DIAGNOSIS — Z6841 Body Mass Index (BMI) 40.0 and over, adult: Secondary | ICD-10-CM | POA: Insufficient documentation

## 2020-02-19 NOTE — Addendum Note (Signed)
Addended by: Albertina Parr on: 02/19/2020 03:55 PM   Modules accepted: Orders

## 2020-02-19 NOTE — Progress Notes (Signed)
Office Visit Note   Patient: Courtney Bates           Date of Birth: 10-Jun-1966           MRN: [id number] Visit Date: 02/19/2020              Requested by: Gregor Hams, MD [place],  [place] [address] PCP: Marrian Salvage, FNP   Assessment & Plan: Visit Diagnoses:  1. Primary osteoarthritis of right knee   2. Body mass index 45.0-49.9, adult ([place])   3. Morbid obesity ([place])     Plan: Impression is severe right knee DJD.  Unfortunately patient's BMI is 49 and is not a surgical candidate at this time.  Target weight would be around 250 pounds.  She is interested in gastric bypass surgery therefore referral has been made.  Questions encouraged and answered.  Patient will follow up with [place] once she has reached her target weight.  She just received a cortisone injection 2 weeks ago.  Follow-Up Instructions: Return if symptoms worsen or fail to improve.   Orders:  Orders Placed This Encounter  Procedures  . XR KNEE 3 VIEW RIGHT   No orders of the defined types were placed in this encounter.     Procedures: No procedures performed   Clinical Data: No additional findings.   Subjective: Chief Complaint  Patient presents with  . Right Knee - Pain    Patient is a very pleasant 54 year old female comes in for evaluation of of chronic right knee pain.  This has gotten worse over the last 2 to 3 years.  She has undergone's extensive conservative treatment with ice and sports medicine.  Unfortunately she has not gotten much prolonged pain relief.  She sews for work.  She feels some radiating thigh pain from the knee.  Over-the-counter medications and tramadol are not helping significantly.   Review of Systems  Constitutional: Negative.   HENT: Negative.   Eyes: Negative.   Respiratory: Negative.   Cardiovascular: Negative.   Endocrine: Negative.   Musculoskeletal: Negative.   Neurological: Negative.   Hematological: Negative.     Psychiatric/Behavioral: Negative.   All other systems reviewed and are negative.    Objective: Vital Signs: There were no vitals taken for this visit.  Physical Exam Vitals and nursing note reviewed.  Constitutional:      Appearance: She is well-developed.  Pulmonary:     Effort: Pulmonary effort is normal.  Skin:    General: Skin is warm.     Capillary Refill: Capillary refill takes less than 2 seconds.  Neurological:     Mental Status: She is alert and oriented to person, place, and time.  Psychiatric:        Behavior: Behavior normal.        Thought Content: Thought content normal.        Judgment: Judgment normal.     Ortho Exam Right knee exam shows no joint effusion.  Moderate pain with range of motion with 2+ patellofemoral crepitus.  Moderate restriction range of motion.  Collaterals and cruciates are stable. Specialty Comments:  No specialty comments available.  Imaging: XR KNEE 3 VIEW RIGHT  Result Date: 02/19/2020 Severe tricompartmental degenerative joint disease    PMFS History: Patient Active Problem List   Diagnosis Date Noted  . Body mass index 45.0-49.9, adult ([place]) 02/19/2020  . Degenerative arthritis of knee, bilateral 05/15/2019  . OA (osteoarthritis) of knee 12/18/2017  . Palpitations 12/18/2017  .  Patellar tendinitis of both knees 05/10/2017  . Iron deficiency 05/11/2016  . Routine general medical examination at a health care facility 04/08/2016  . Anxiety and depression 01/12/2016  . Migraine 09/08/2015  . Sleep disturbance 09/08/2015  . LVH (left ventricular hypertrophy) 01/30/2014  . Hypertension   . Morbid obesity (HCC)   . GERD (gastroesophageal reflux disease)    Past Medical History:  Diagnosis Date  . Allergy   . Anemia   . Depression   . Family history of thyroid disease   . Frequent headaches   . GERD (gastroesophageal reflux disease)   . Hypertension   . Hypertensive heart disease    ECHO 01/30/14 with concentric LVH   EF 60% and   . Morbid obesity (HCC)     Family History  Problem Relation Age of Onset  . Hypertension Mother   . Arthritis Mother   . Other Father        brain tumor  . Heart disease Maternal Grandfather   . Heart disease Paternal Grandmother   . Heart disease Paternal Grandfather   . Colon cancer Other   . Esophageal cancer Neg Hx   . Pancreatic cancer Neg Hx   . Prostate cancer Neg Hx   . Rectal cancer Neg Hx   . Stomach cancer Neg Hx     Past Surgical History:  Procedure Laterality Date  . ganglion cyst  2005   right wrist  . WISDOM TOOTH EXTRACTION     Social History   Occupational History  . Occupation: Geographical information systems officer  Tobacco Use  . Smoking status: Never Smoker  . Smokeless tobacco: Never Used  Substance and Sexual Activity  . Alcohol use: Yes    Comment: once every 2-3 months  . Drug use: No  . Sexual activity: Not Currently

## 2020-04-07 ENCOUNTER — Other Ambulatory Visit: Payer: Self-pay | Admitting: Family

## 2020-04-07 MED ORDER — VENLAFAXINE HCL ER 75 MG PO CP24: 75.0000 mg | ORAL_CAPSULE | Freq: Every day | ORAL | 1 refills | Status: DC

## 2020-05-02 ENCOUNTER — Encounter: Payer: BLUE CROSS/BLUE SHIELD | Admitting: Family

## 2020-05-09 ENCOUNTER — Other Ambulatory Visit: Payer: Self-pay

## 2020-05-09 ENCOUNTER — Ambulatory Visit: Payer: BLUE CROSS/BLUE SHIELD | Admitting: Family

## 2020-05-09 ENCOUNTER — Encounter: Payer: Self-pay | Admitting: Family

## 2020-05-09 VITALS — BP 144/82 | HR 85 | Temp 98.4°F | Ht 66.0 in | Wt 299.4 lb

## 2020-05-09 DIAGNOSIS — E559 Vitamin D deficiency, unspecified: Secondary | ICD-10-CM | POA: Diagnosis not present

## 2020-05-09 DIAGNOSIS — Z0000 Encounter for general adult medical examination without abnormal findings: Secondary | ICD-10-CM | POA: Diagnosis not present

## 2020-05-09 DIAGNOSIS — R200 Anesthesia of skin: Secondary | ICD-10-CM | POA: Diagnosis not present

## 2020-05-09 DIAGNOSIS — Z13220 Encounter for screening for lipoid disorders: Secondary | ICD-10-CM | POA: Diagnosis not present

## 2020-05-09 DIAGNOSIS — D509 Iron deficiency anemia, unspecified: Secondary | ICD-10-CM

## 2020-05-09 DIAGNOSIS — R202 Paresthesia of skin: Secondary | ICD-10-CM | POA: Diagnosis not present

## 2020-05-09 DIAGNOSIS — I10 Essential (primary) hypertension: Secondary | ICD-10-CM

## 2020-05-09 DIAGNOSIS — M7989 Other specified soft tissue disorders: Secondary | ICD-10-CM | POA: Diagnosis not present

## 2020-05-09 LAB — CBC WITH DIFFERENTIAL/PLATELET
Basophils Absolute: 0.1 10*3/uL (ref 0.0–0.1)
Basophils Relative: 0.7 % (ref 0.0–3.0)
Eosinophils Absolute: 0.2 10*3/uL (ref 0.0–0.7)
Eosinophils Relative: 2.1 % (ref 0.0–5.0)
HCT: 40.2 % (ref 36.0–46.0)
Hemoglobin: 13.3 g/dL (ref 12.0–15.0)
Lymphocytes Relative: 27 % (ref 12.0–46.0)
Lymphs Abs: 2.4 10*3/uL (ref 0.7–4.0)
MCHC: 33 g/dL (ref 30.0–36.0)
MCV: 80.3 fl (ref 78.0–100.0)
Monocytes Absolute: 0.6 10*3/uL (ref 0.1–1.0)
Monocytes Relative: 6.9 % (ref 3.0–12.0)
Neutro Abs: 5.6 10*3/uL (ref 1.4–7.7)
Neutrophils Relative %: 63.3 % (ref 43.0–77.0)
Platelets: 341 10*3/uL (ref 150.0–400.0)
RBC: 5 Mil/uL (ref 3.87–5.11)
RDW: 14.1 % (ref 11.5–15.5)
WBC: 8.9 10*3/uL (ref 4.0–10.5)

## 2020-05-09 LAB — LIPID PANEL
Cholesterol: 166 mg/dL (ref 0–200)
HDL: 40.7 mg/dL (ref >39.00)
LDL Cholesterol: 94 mg/dL (ref 0–99)
NonHDL: 125.71
Total CHOL/HDL Ratio: 4
Triglycerides: 158 mg/dL — ABNORMAL HIGH (ref 0.0–149.0)
VLDL: 31.6 mg/dL (ref 0.0–40.0)

## 2020-05-09 LAB — COMPREHENSIVE METABOLIC PANEL
ALT: 9 U/L (ref 0–35)
AST: 13 U/L (ref 0–37)
Albumin: 4.4 g/dL (ref 3.5–5.2)
Alkaline Phosphatase: 75 U/L (ref 39–117)
BUN: 14 mg/dL (ref 6–23)
CO2: 31 mEq/L (ref 19–32)
Calcium: 9.5 mg/dL (ref 8.4–10.5)
Chloride: 99 mEq/L (ref 96–112)
Creatinine, Ser: 0.95 mg/dL (ref 0.40–1.20)
GFR: 74.34 mL/min (ref >60.00)
Glucose, Bld: 89 mg/dL (ref 70–99)
Potassium: 3.5 mEq/L (ref 3.5–5.1)
Sodium: 139 mEq/L (ref 135–145)
Total Bilirubin: 0.5 mg/dL (ref 0.2–1.2)
Total Protein: 7 g/dL (ref 6.0–8.3)

## 2020-05-09 LAB — TSH: TSH: 1.81 u[IU]/mL (ref 0.35–4.50)

## 2020-05-09 LAB — VITAMIN B12: Vitamin B-12: 164 pg/mL — ABNORMAL LOW (ref 211–911)

## 2020-05-09 LAB — VITAMIN D 25 HYDROXY (VIT D DEFICIENCY, FRACTURES): VITD: 15.11 ng/mL — ABNORMAL LOW (ref 30.00–100.00)

## 2020-05-09 MED ORDER — METOPROLOL SUCCINATE ER 25 MG PO TB24: 25.0000 mg | ORAL_TABLET | Freq: Every day | ORAL | 0 refills | Status: DC

## 2020-05-09 MED ORDER — VENLAFAXINE HCL ER 75 MG PO CP24: 75.0000 mg | ORAL_CAPSULE | Freq: Every day | ORAL | 1 refills | Status: DC

## 2020-05-09 NOTE — Progress Notes (Signed)
Courtney Bates is a 54 y.o. female with the following history as recorded in EpicCare:  Patient Active Problem List   Diagnosis Date Noted  . Body mass index 45.0-49.9, adult ([place]) 02/19/2020  . Degenerative arthritis of knee, bilateral 05/15/2019  . OA (osteoarthritis) of knee 12/18/2017  . Palpitations 12/18/2017  . Patellar tendinitis of both knees 05/10/2017  . Iron deficiency 05/11/2016  . Routine general medical examination at a health care facility 04/08/2016  . Anxiety and depression 01/12/2016  . Migraine 09/08/2015  . Sleep disturbance 09/08/2015  . LVH (left ventricular hypertrophy) 01/30/2014  . Hypertension   . Morbid obesity ([place])   . GERD (gastroesophageal reflux disease)     Current Outpatient Medications  Medication Sig Dispense Refill  . amLODipine (NORVASC) 10 MG tablet Take 1 tablet (10 mg total) by mouth daily. 90 tablet 3  . Colchicine 0.6 MG CAPS Take 1 capsule by mouth daily as needed (Pseudogout pain). 30 capsule 2  . Diclofenac Sodium 2 % SOLN Place 2 g onto the skin 2 (two) times daily. 112 g 3  . fluticasone (FLONASE) 50 MCG/ACT nasal spray Place 2 sprays into both nostrils daily. 16 g 6  . HYDROcodone-acetaminophen (NORCO/VICODIN) 5-325 MG tablet Take 1 tablet by mouth every 6 (six) hours as needed. 15 tablet 0  . losartan-hydrochlorothiazide (HYZAAR) 100-25 MG tablet Take 1 tablet by mouth daily. 90 tablet 3  . meloxicam (MOBIC) 15 MG tablet Take 1 tablet (15 mg total) by mouth daily. 30 tablet 0  . Omega-3 Fatty Acids (FISH OIL) 1000 MG CAPS Take by mouth.    . pantoprazole (PROTONIX) 40 MG tablet Take 1 tablet (40 mg total) by mouth daily. 90 tablet 3  . traMADol (ULTRAM) 50 MG tablet     . VENTOLIN HFA 108 (90 Base) MCG/ACT inhaler TAKE 2 PUFFS BY MOUTH EVERY 4 HOURS AS NEEDED    . metoprolol succinate (TOPROL-XL) 25 MG 24 hr tablet Take 1 tablet (25 mg total) by mouth daily. 90 tablet 0  . venlafaxine XR (EFFEXOR-XR) 75 MG 24 hr capsule Take 1  capsule (75 mg total) by mouth daily. 90 capsule 1   No current facility-administered medications for this visit.    Allergies: Penicillins  Past Medical History:  Diagnosis Date  . Allergy   . Anemia   . Depression   . Family history of thyroid disease   . Frequent headaches   . GERD (gastroesophageal reflux disease)   . Hypertension   . Hypertensive heart disease    ECHO 01/30/14 with concentric LVH  EF 60% and   . Morbid obesity ([place])     Past Surgical History:  Procedure Laterality Date  . ganglion cyst  2005   right wrist  . WISDOM TOOTH EXTRACTION      Family History  Problem Relation Age of Onset  . Hypertension Mother   . Arthritis Mother   . Other Father        brain tumor  . Heart disease Maternal Grandfather   . Heart disease Paternal Grandmother   . Heart disease Paternal Grandfather   . Colon cancer Other   . Esophageal cancer Neg Hx   . Pancreatic cancer Neg Hx   . Prostate cancer Neg Hx   . Rectal cancer Neg Hx   . Stomach cancer Neg Hx     Social History   Tobacco Use  . Smoking status: Never Smoker  . Smokeless tobacco: Never Used  Substance Use Topics  .  Alcohol use: Yes    Comment: once every 2-3 months    Subjective:  Presents for yearly CPE; does have GYN; Is worried about her blood pressure- upon review of medications, she admits that she did not understand she was supposed to be taking her Lopressor twice a day.  Admits that her anxiety is not controlled- she stopped her Effexor about 2 months ago however;  Considering getting COVID vaccine through her employer.  She admits she is very worried about the possibility of having blood clots in her legs- 2 cousins have recently passed away with complications from blood clots. She herself has never had any concerns for blood clots- no complications during pregnancy;  Health Maintenance  Topic Date Due  . COVID-19 Vaccine (1) Never done  . INFLUENZA VACCINE  07/13/2020  . MAMMOGRAM   09/25/2021  . PAP SMEAR-Modifier  11/13/2022  . TETANUS/TDAP  09/07/2025  . COLONOSCOPY  02/07/2027  . HIV Screening  Completed    Review of Systems  Constitutional: Positive for malaise/fatigue.  HENT: Negative.   Eyes: Negative.   Respiratory: Negative.   Cardiovascular: Negative.   Gastrointestinal: Negative.   Genitourinary: Negative.   Musculoskeletal: Negative.   Skin: Negative.   Neurological: Positive for headaches.  Psychiatric/Behavioral: The patient is nervous/anxious and has insomnia.       Objective:  Vitals:   05/09/20 1425  BP: (!) 144/82  Pulse: 85  Temp: 98.4 F (36.9 C)  TempSrc: Oral  SpO2: 97%  Weight: 299 lb 6.4 oz (135.8 kg)  Height: '5\' 6"'$  (1.676 m)    General: Well developed, well nourished, in no acute distress  Skin : Warm and dry.  Head: Normocephalic and atraumatic  Eyes: Sclera and conjunctiva clear; pupils round and reactive to light; extraocular movements intact  Ears: External normal; canals clear; tympanic membranes normal  Oropharynx: Pink, supple. No suspicious lesions  Neck: Supple without thyromegaly, adenopathy  Lungs: Respirations unlabored; clear to auscultation bilaterally without wheeze, rales, rhonchi  CVS exam: normal rate and regular rhythm.  Abdomen: Soft; nontender; nondistended; normoactive bowel sounds; no masses or hepatosplenomegaly  Musculoskeletal: No deformities; no active joint inflammation  Extremities: No edema, cyanosis, clubbing  Vessels: Symmetric bilaterally  Neurologic: Alert and oriented; speech intact; face symmetrical; moves all extremities well; CNII-XII intact without focal deficit   Assessment:  1. PE (physical exam), annual   2. Lipid screening   3. Iron deficiency anemia, unspecified iron deficiency anemia type   4. Numbness and tingling   5. Vitamin D deficiency   6. Leg swelling   7. Essential hypertension   8. Morbid obesity ([place])     Plan:  Age appropriate preventive healthcare needs  addressed; encouraged regular eye doctor and dental exams; encouraged regular exercise and weight loss; will update labs and refills as needed today; follow-up to be determined; Change Lopressor to Toprol XL since patient has not been taking twice a day; she will continue Amlodipine and Losartan HCT;  Re-start Effexor XR- start at 75 mg and increase to 150 mg; Will update bilateral dopplers due to patient's anxiety regarding FH of blood clots; Follow up in 1 month, sooner prn.  This visit occurred during the SARS-CoV-2 public health emergency.  Safety protocols were in place, including screening questions prior to the visit, additional usage of staff PPE, and extensive cleaning of exam room while observing appropriate contact time as indicated for disinfecting solutions.      Return in about 1 month (around 06/09/2020).  Orders  Placed This Encounter  Procedures  . CBC with Differential/Platelet  . Comp Met (CMET)  . Lipid panel  . TSH  . Iron, TIBC and Ferritin Panel  . B12  . Vitamin D (25 hydroxy)  . D-Dimer, Quantitative    Requested Prescriptions   Signed Prescriptions Disp Refills  . metoprolol succinate (TOPROL-XL) 25 MG 24 hr tablet 90 tablet 0    Sig: Take 1 tablet (25 mg total) by mouth daily.  Marland Kitchen venlafaxine XR (EFFEXOR-XR) 75 MG 24 hr capsule 90 capsule 1    Sig: Take 1 capsule (75 mg total) by mouth daily.

## 2020-05-10 LAB — IRON,TIBC AND FERRITIN PANEL
%SAT: 12 % (calc) — ABNORMAL LOW (ref 16–45)
Ferritin: 33 ng/mL (ref 16–232)
Iron: 44 ug/dL — ABNORMAL LOW (ref 45–160)
TIBC: 372 mcg/dL (calc) (ref 250–450)

## 2020-05-10 LAB — D-DIMER, QUANTITATIVE: D-Dimer, Quant: 0.4 mcg/mL FEU (ref <0.50)

## 2020-05-13 ENCOUNTER — Other Ambulatory Visit: Payer: Self-pay | Admitting: Family

## 2020-05-13 MED ORDER — VITAMIN D (ERGOCALCIFEROL) 1.25 MG (50000 UNIT) PO CAPS
50000.0000 [IU] | ORAL_CAPSULE | ORAL | 0 refills | Status: AC
Start: 2020-05-13 — End: 2020-07-30

## 2020-05-14 ENCOUNTER — Ambulatory Visit (HOSPITAL_COMMUNITY)
Admission: RE | Admit: 2020-05-14 | Discharge: 2020-05-14 | Disposition: A | Discharge status: Home / Self Care | Payer: BLUE CROSS/BLUE SHIELD | Source: Ambulatory Visit | Attending: Cardiology | Admitting: Cardiology

## 2020-05-14 ENCOUNTER — Ambulatory Visit (INDEPENDENT_AMBULATORY_CARE_PROVIDER_SITE_OTHER): Payer: BLUE CROSS/BLUE SHIELD | Admitting: *Deleted

## 2020-05-14 ENCOUNTER — Other Ambulatory Visit: Payer: Self-pay

## 2020-05-14 DIAGNOSIS — E538 Deficiency of other specified B group vitamins: Secondary | ICD-10-CM | POA: Diagnosis not present

## 2020-05-14 DIAGNOSIS — M7989 Other specified soft tissue disorders: Secondary | ICD-10-CM | POA: Diagnosis not present

## 2020-05-14 MED ORDER — CYANOCOBALAMIN 1000 MCG/ML IJ SOLN
1000.0000 ug | Freq: Once | INTRAMUSCULAR | Status: AC
  Administered 2020-05-14: 1000 ug via INTRAMUSCULAR

## 2020-05-14 NOTE — Progress Notes (Signed)
Pls cosign for B12 inj../lmb  

## 2020-05-21 ENCOUNTER — Ambulatory Visit (INDEPENDENT_AMBULATORY_CARE_PROVIDER_SITE_OTHER): Payer: BLUE CROSS/BLUE SHIELD | Admitting: *Deleted

## 2020-05-21 ENCOUNTER — Other Ambulatory Visit: Payer: Self-pay

## 2020-05-21 ENCOUNTER — Telehealth: Payer: Self-pay | Admitting: *Deleted

## 2020-05-21 DIAGNOSIS — E538 Deficiency of other specified B group vitamins: Secondary | ICD-10-CM | POA: Diagnosis not present

## 2020-05-21 MED ORDER — CYANOCOBALAMIN 1000 MCG/ML IJ SOLN
1000.0000 ug | Freq: Once | INTRAMUSCULAR | Status: AC
  Administered 2020-05-21: 1000 ug via INTRAMUSCULAR

## 2020-05-21 NOTE — Progress Notes (Signed)
Pls cosign for B12 inj../lmb  

## 2020-05-21 NOTE — Telephone Encounter (Signed)
Pt is wanting to get a referral to another Orthopedist. She states she did not have a good experience w/[facility].Marland KitchenRaechel Chute

## 2020-05-23 ENCOUNTER — Other Ambulatory Visit: Payer: Self-pay | Admitting: Family

## 2020-05-23 DIAGNOSIS — M7120 Synovial cyst of popliteal space [Baker], unspecified knee: Secondary | ICD-10-CM

## 2020-05-28 ENCOUNTER — Other Ambulatory Visit: Payer: Self-pay

## 2020-05-28 ENCOUNTER — Ambulatory Visit (INDEPENDENT_AMBULATORY_CARE_PROVIDER_SITE_OTHER): Payer: BLUE CROSS/BLUE SHIELD | Admitting: *Deleted

## 2020-05-28 DIAGNOSIS — E538 Deficiency of other specified B group vitamins: Secondary | ICD-10-CM | POA: Diagnosis not present

## 2020-05-28 MED ORDER — CYANOCOBALAMIN 1000 MCG/ML IJ SOLN
1000.0000 ug | Freq: Once | INTRAMUSCULAR | Status: AC
  Administered 2020-05-28: 1000 ug via INTRAMUSCULAR

## 2020-05-28 NOTE — Progress Notes (Signed)
Pls cosign for B12 inj in absence of PCP not in the office this afternoon...Raechel Chute

## 2020-06-04 ENCOUNTER — Other Ambulatory Visit: Payer: Self-pay

## 2020-06-04 ENCOUNTER — Ambulatory Visit (INDEPENDENT_AMBULATORY_CARE_PROVIDER_SITE_OTHER): Payer: BLUE CROSS/BLUE SHIELD | Admitting: *Deleted

## 2020-06-04 DIAGNOSIS — E538 Deficiency of other specified B group vitamins: Secondary | ICD-10-CM

## 2020-06-04 MED ORDER — CYANOCOBALAMIN 1000 MCG/ML IJ SOLN
1000.0000 ug | Freq: Once | INTRAMUSCULAR | Status: AC
  Administered 2020-06-04: 1000 ug via INTRAMUSCULAR

## 2020-06-04 NOTE — Progress Notes (Signed)
Pls cosign for B12 inj../lmb  

## 2020-06-05 DIAGNOSIS — M25561 Pain in right knee: Secondary | ICD-10-CM | POA: Diagnosis not present

## 2020-06-05 DIAGNOSIS — M25562 Pain in left knee: Secondary | ICD-10-CM | POA: Diagnosis not present

## 2020-06-05 DIAGNOSIS — M170 Bilateral primary osteoarthritis of knee: Secondary | ICD-10-CM | POA: Diagnosis not present

## 2020-06-09 ENCOUNTER — Ambulatory Visit: Payer: BLUE CROSS/BLUE SHIELD | Admitting: Family

## 2020-06-09 ENCOUNTER — Encounter: Payer: Self-pay | Admitting: Family

## 2020-06-09 ENCOUNTER — Other Ambulatory Visit: Payer: Self-pay | Admitting: Family

## 2020-06-09 ENCOUNTER — Other Ambulatory Visit: Payer: Self-pay

## 2020-06-09 VITALS — BP 132/80 | HR 90 | Temp 98.2°F | Ht 66.0 in | Wt 296.0 lb

## 2020-06-09 DIAGNOSIS — R519 Headache, unspecified: Secondary | ICD-10-CM | POA: Diagnosis not present

## 2020-06-09 DIAGNOSIS — I10 Essential (primary) hypertension: Secondary | ICD-10-CM

## 2020-06-09 DIAGNOSIS — E538 Deficiency of other specified B group vitamins: Secondary | ICD-10-CM

## 2020-06-09 DIAGNOSIS — F419 Anxiety disorder, unspecified: Secondary | ICD-10-CM | POA: Diagnosis not present

## 2020-06-09 DIAGNOSIS — F329 Major depressive disorder, single episode, unspecified: Secondary | ICD-10-CM

## 2020-06-09 DIAGNOSIS — F32A Depression, unspecified: Secondary | ICD-10-CM

## 2020-06-09 LAB — COMPREHENSIVE METABOLIC PANEL
ALT: 7 U/L (ref 0–35)
AST: 10 U/L (ref 0–37)
Albumin: 4.3 g/dL (ref 3.5–5.2)
Alkaline Phosphatase: 78 U/L (ref 39–117)
BUN: 19 mg/dL (ref 6–23)
CO2: 31 mEq/L (ref 19–32)
Calcium: 9.4 mg/dL (ref 8.4–10.5)
Chloride: 101 mEq/L (ref 96–112)
Creatinine, Ser: 1.07 mg/dL (ref 0.40–1.20)
GFR: 64.78 mL/min (ref >60.00)
Glucose, Bld: 98 mg/dL (ref 70–99)
Potassium: 3.2 mEq/L — ABNORMAL LOW (ref 3.5–5.1)
Sodium: 139 mEq/L (ref 135–145)
Total Bilirubin: 0.5 mg/dL (ref 0.2–1.2)
Total Protein: 7 g/dL (ref 6.0–8.3)

## 2020-06-09 LAB — CBC WITH DIFFERENTIAL/PLATELET
Basophils Absolute: 0 10*3/uL (ref 0.0–0.1)
Basophils Relative: 0.2 % (ref 0.0–3.0)
Eosinophils Absolute: 0 10*3/uL (ref 0.0–0.7)
Eosinophils Relative: 0.2 % (ref 0.0–5.0)
HCT: 40.5 % (ref 36.0–46.0)
Hemoglobin: 13.3 g/dL (ref 12.0–15.0)
Lymphocytes Relative: 22.3 % (ref 12.0–46.0)
Lymphs Abs: 3.4 10*3/uL (ref 0.7–4.0)
MCHC: 32.9 g/dL (ref 30.0–36.0)
MCV: 80.9 fl (ref 78.0–100.0)
Monocytes Absolute: 1 10*3/uL (ref 0.1–1.0)
Monocytes Relative: 6.4 % (ref 3.0–12.0)
Neutro Abs: 10.8 10*3/uL — ABNORMAL HIGH (ref 1.4–7.7)
Neutrophils Relative %: 70.9 % (ref 43.0–77.0)
Platelets: 383 10*3/uL (ref 150.0–400.0)
RBC: 5.01 Mil/uL (ref 3.87–5.11)
RDW: 14 % (ref 11.5–15.5)
WBC: 15.3 10*3/uL — ABNORMAL HIGH (ref 4.0–10.5)

## 2020-06-09 LAB — VITAMIN B12: Vitamin B-12: 936 pg/mL — ABNORMAL HIGH (ref 211–911)

## 2020-06-09 MED ORDER — AMLODIPINE BESYLATE 10 MG PO TABS: 10.0000 mg | ORAL_TABLET | Freq: Every day | ORAL | 3 refills | Status: DC

## 2020-06-09 MED ORDER — KETOROLAC TROMETHAMINE 30 MG/ML IJ SOLN
30.0000 mg | Freq: Once | INTRAMUSCULAR | Status: AC
  Administered 2020-06-09: 30 mg via INTRAMUSCULAR

## 2020-06-09 MED ORDER — KETOROLAC TROMETHAMINE 30 MG/ML IJ SOLN: 30.0000 mg | Freq: Once | INTRAMUSCULAR | Status: DC

## 2020-06-09 MED ORDER — METOPROLOL SUCCINATE ER 25 MG PO TB24: 25.0000 mg | ORAL_TABLET | Freq: Every day | ORAL | 3 refills | Status: DC

## 2020-06-09 MED ORDER — VENLAFAXINE HCL ER 150 MG PO CP24: 150.0000 mg | ORAL_CAPSULE | Freq: Every day | ORAL | 1 refills | Status: DC

## 2020-06-09 MED ORDER — LOSARTAN POTASSIUM-HCTZ 100-25 MG PO TABS: 1.0000 | ORAL_TABLET | Freq: Every day | ORAL | 3 refills | Status: DC

## 2020-06-09 NOTE — Progress Notes (Signed)
Courtney Bates is a 54 y.o. female with the following history as recorded in EpicCare:  Patient Active Problem List   Diagnosis Date Noted  . Body mass index 45.0-49.9, adult ([place]) 02/19/2020  . Degenerative arthritis of knee, bilateral 05/15/2019  . OA (osteoarthritis) of knee 12/18/2017  . Palpitations 12/18/2017  . Patellar tendinitis of both knees 05/10/2017  . Iron deficiency 05/11/2016  . Routine general medical examination at a health care facility 04/08/2016  . Anxiety and depression 01/12/2016  . Migraine 09/08/2015  . Sleep disturbance 09/08/2015  . LVH (left ventricular hypertrophy) 01/30/2014  . Hypertension   . Morbid obesity ([place])   . GERD (gastroesophageal reflux disease)     Current Outpatient Medications  Medication Sig Dispense Refill  . amLODipine (NORVASC) 10 MG tablet Take 1 tablet (10 mg total) by mouth daily. 90 tablet 3  . Colchicine 0.6 MG CAPS Take 1 capsule by mouth daily as needed (Pseudogout pain). 30 capsule 2  . Diclofenac Sodium 2 % SOLN Place 2 g onto the skin 2 (two) times daily. 112 g 3  . fluticasone (FLONASE) 50 MCG/ACT nasal spray Place 2 sprays into both nostrils daily. 16 g 6  . HYDROcodone-acetaminophen (NORCO/VICODIN) 5-325 MG tablet Take 1 tablet by mouth every 6 (six) hours as needed. 15 tablet 0  . losartan-hydrochlorothiazide (HYZAAR) 100-25 MG tablet Take 1 tablet by mouth daily. 90 tablet 3  . meloxicam (MOBIC) 15 MG tablet Take 1 tablet (15 mg total) by mouth daily. 30 tablet 0  . metoprolol succinate (TOPROL-XL) 25 MG 24 hr tablet Take 1 tablet (25 mg total) by mouth daily. 90 tablet 3  . Omega-3 Fatty Acids (FISH OIL) 1000 MG CAPS Take by mouth.    . pantoprazole (PROTONIX) 40 MG tablet Take 1 tablet (40 mg total) by mouth daily. 90 tablet 3  . traMADol (ULTRAM) 50 MG tablet     . venlafaxine XR (EFFEXOR-XR) 150 MG 24 hr capsule Take 1 capsule (150 mg total) by mouth daily. 90 capsule 1  . VENTOLIN HFA 108 (90 Base) MCG/ACT inhaler  TAKE 2 PUFFS BY MOUTH EVERY 4 HOURS AS NEEDED    . Vitamin D, Ergocalciferol, (DRISDOL) 1.25 MG (50000 UNIT) CAPS capsule Take 1 capsule (50,000 Units total) by mouth every 7 (seven) days for 12 doses. 12 capsule 0   No current facility-administered medications for this visit.    Allergies: Penicillins  Past Medical History:  Diagnosis Date  . Allergy   . Anemia   . Depression   . Family history of thyroid disease   . Frequent headaches   . GERD (gastroesophageal reflux disease)   . Hypertension   . Hypertensive heart disease    ECHO 01/30/14 with concentric LVH  EF 60% and   . Morbid obesity ([place])     Past Surgical History:  Procedure Laterality Date  . ganglion cyst  2005   right wrist  . WISDOM TOOTH EXTRACTION      Family History  Problem Relation Age of Onset  . Hypertension Mother   . Arthritis Mother   . Other Father        brain tumor  . Heart disease Maternal Grandfather   . Heart disease Paternal Grandmother   . Heart disease Paternal Grandfather   . Colon cancer Other   . Esophageal cancer Neg Hx   . Pancreatic cancer Neg Hx   . Prostate cancer Neg Hx   . Rectal cancer Neg Hx   .  Stomach cancer Neg Hx     Social History   Tobacco Use  . Smoking status: Never Smoker  . Smokeless tobacco: Never Used  Substance Use Topics  . Alcohol use: Yes    Comment: once every 2-3 months    Subjective:  1 month follow-up on :  1) Hypertension- at last office visit, Toprol XL was added to current regimen; Denies any chest pain, shortness of breath, blurred vision or headache 2) Anxiety and depression- was started back on Effexor at last OV- wants to increase dosage back to 150 mg; 3) B12 deficiency- can tell improvement since starting weekly shots; 4) Working with [facility] for knee pain- planning to start gel injections if insurance approves; 5) Feels like she has a migraine headache today- has had a headache x 4 days;      Objective:  Vitals:   06/09/20  1409  BP: 132/80  Pulse: 90  Temp: 98.2 F (36.8 C)  TempSrc: Oral  SpO2: 96%  Weight: 296 lb (134.3 kg)  Height: _0  (1.676 m)    General: Well developed, well nourished, in no acute distress  Skin : Warm and dry.  Head: Normocephalic and atraumatic  Eyes: Sclera and conjunctiva clear; pupils round and reactive to light; extraocular movements intact  Ears: External normal; canals clear; tympanic membranes normal  Oropharynx: Pink, supple. No suspicious lesions  Neck: Supple without thyromegaly, adenopathy  Lungs: Respirations unlabored; clear to auscultation bilaterally without wheeze, rales, rhonchi  CVS exam: normal rate and regular rhythm.  Abdomen: Soft; nontender; nondistended; normoactive bowel sounds; no masses or hepatosplenomegaly  Musculoskeletal: No deformities; no active joint inflammation  Extremities: No edema, cyanosis, clubbing  Vessels: Symmetric bilaterally  Neurologic: Alert and oriented; speech intact; face symmetrical; moves all extremities well; CNII-XII intact without focal deficit   Assessment:  1. B12 deficiency   2. Hypertension, unspecified type   3. Anxiety and depression   4. Acute nonintractable headache, unspecified headache type     Plan:   1. Update lab today; follow-up to be determined on how to maintain B12 level; 2. Stable; continue same medications; refills updated; 3. Increase Effexor XR to 150 mg daily; 4. Toradol IM 30 mg given today; discussed trial of Topamax- will re-evaluate on a day when she is feeling better;  This visit occurred during the SARS-CoV-2 public health emergency.  Safety protocols were in place, including screening questions prior to the visit, additional usage of staff PPE, and extensive cleaning of exam room while observing appropriate contact time as indicated for disinfecting solutions.       No follow-ups on file.  Orders Placed This Encounter  Procedures  . CBC with Differential/Platelet  . Comp Met  (CMET)  . B12    Requested Prescriptions   Signed Prescriptions Disp Refills  . venlafaxine XR (EFFEXOR-XR) 150 MG 24 hr capsule 90 capsule 1    Sig: Take 1 capsule (150 mg total) by mouth daily.  Marland Kitchen amLODipine (NORVASC) 10 MG tablet 90 tablet 3    Sig: Take 1 tablet (10 mg total) by mouth daily.  Marland Kitchen losartan-hydrochlorothiazide (HYZAAR) 100-25 MG tablet 90 tablet 3    Sig: Take 1 tablet by mouth daily.  . metoprolol succinate (TOPROL-XL) 25 MG 24 hr tablet 90 tablet 3    Sig: Take 1 tablet (25 mg total) by mouth daily.

## 2020-06-11 ENCOUNTER — Other Ambulatory Visit: Payer: Self-pay | Admitting: Family

## 2020-06-11 MED ORDER — POTASSIUM CHLORIDE CRYS ER 20 MEQ PO TBCR
20.0000 meq | EXTENDED_RELEASE_TABLET | Freq: Every day | ORAL | 0 refills | Status: DC
Start: 2020-06-11 — End: 2021-04-15

## 2020-06-18 ENCOUNTER — Other Ambulatory Visit: Payer: Self-pay | Admitting: Family

## 2020-06-18 DIAGNOSIS — K219 Gastro-esophageal reflux disease without esophagitis: Secondary | ICD-10-CM

## 2020-07-02 ENCOUNTER — Telehealth: Payer: Self-pay | Admitting: Family

## 2020-07-02 DIAGNOSIS — E538 Deficiency of other specified B group vitamins: Secondary | ICD-10-CM

## 2020-07-02 NOTE — Telephone Encounter (Signed)
Can take OTC B12; do not have a reason to continue the injections.

## 2020-07-02 NOTE — Telephone Encounter (Signed)
    Patient states she would like to resume B12.  Would this be advisable?

## 2020-07-04 NOTE — Telephone Encounter (Signed)
We just checked her B12 level 3 weeks ago; if she wants to get it checked at [facility], that is fine. I will put the order in place for her.  I may be overlooking something in her chart- does she have sleep apnea? Has she ever had a sleep study? I am suspicious she needs a sleep study.

## 2020-07-07 ENCOUNTER — Other Ambulatory Visit: Payer: Self-pay | Admitting: Family

## 2020-07-07 DIAGNOSIS — G479 Sleep disorder, unspecified: Secondary | ICD-10-CM

## 2020-07-07 NOTE — Telephone Encounter (Signed)
Notified pt w/Laura response. Pt states she never had a sleep study done, nor no one has ever said she had sleep apnea. Pt states she will go tomorrow to have labs drawn.Marland KitchenRaechel Chute

## 2020-07-08 ENCOUNTER — Other Ambulatory Visit (INDEPENDENT_AMBULATORY_CARE_PROVIDER_SITE_OTHER): Payer: BLUE CROSS/BLUE SHIELD

## 2020-07-08 DIAGNOSIS — E538 Deficiency of other specified B group vitamins: Secondary | ICD-10-CM | POA: Diagnosis not present

## 2020-07-08 LAB — VITAMIN B12: Vitamin B-12: 356 pg/mL (ref 211–911)

## 2020-07-08 NOTE — Addendum Note (Signed)
Addended by: Vincenza Hews on: 07/08/2020 01:25 PM   Modules accepted: Orders

## 2020-08-05 DIAGNOSIS — M170 Bilateral primary osteoarthritis of knee: Secondary | ICD-10-CM | POA: Diagnosis not present

## 2020-08-12 DIAGNOSIS — M170 Bilateral primary osteoarthritis of knee: Secondary | ICD-10-CM | POA: Diagnosis not present

## 2020-08-13 ENCOUNTER — Institutional Professional Consult (permissible substitution): Payer: Self-pay | Admitting: Neurology

## 2020-08-19 DIAGNOSIS — M170 Bilateral primary osteoarthritis of knee: Secondary | ICD-10-CM | POA: Diagnosis not present

## 2020-09-18 DIAGNOSIS — M1711 Unilateral primary osteoarthritis, right knee: Secondary | ICD-10-CM | POA: Diagnosis not present

## 2020-09-18 DIAGNOSIS — M170 Bilateral primary osteoarthritis of knee: Secondary | ICD-10-CM | POA: Diagnosis not present

## 2020-10-03 DIAGNOSIS — Z1231 Encounter for screening mammogram for malignant neoplasm of breast: Secondary | ICD-10-CM | POA: Diagnosis not present

## 2020-11-17 ENCOUNTER — Encounter: Payer: Self-pay | Admitting: Nurse Practitioner

## 2020-11-17 ENCOUNTER — Other Ambulatory Visit: Payer: Self-pay

## 2020-11-17 ENCOUNTER — Ambulatory Visit (INDEPENDENT_AMBULATORY_CARE_PROVIDER_SITE_OTHER): Payer: BLUE CROSS/BLUE SHIELD | Admitting: Nurse Practitioner

## 2020-11-17 VITALS — BP 134/80 | Ht 66.0 in | Wt 302.0 lb

## 2020-11-17 DIAGNOSIS — R232 Flushing: Secondary | ICD-10-CM | POA: Diagnosis not present

## 2020-11-17 DIAGNOSIS — Z01419 Encounter for gynecological examination (general) (routine) without abnormal findings: Secondary | ICD-10-CM

## 2020-11-17 NOTE — Patient Instructions (Addendum)
[place]ro[place], Black Cohosh, Ginseng ADD Vitamin E  Health Maintenance for Postmenopausal Women Menopause is a normal process in which your ability to get pregnant comes to an end. This process happens slowly over many months or years, usually between the ages of 26 and 39. Menopause is complete when you have missed your menstrual periods for 12 months. It is important to talk with your health care provider about some of the most common conditions that affect women after menopause (postmenopausal women). These include heart disease, cancer, and bone loss (osteoporosis). Adopting a healthy lifestyle and getting preventive care can help to promote your health and wellness. The actions you take can also lower your chances of developing some of these common conditions. What should I know about menopause? During menopause, you may get a number of symptoms, such as:  Hot flashes. These can be moderate or severe.  Night sweats.  Decrease in sex drive.  Mood swings.  Headaches.  Tiredness.  Irritability.  Memory problems.  Insomnia. Choosing to treat or not to treat these symptoms is a decision that you make with your health care provider. Do I need hormone replacement therapy?  Hormone replacement therapy is effective in treating symptoms that are caused by menopause, such as hot flashes and night sweats.  Hormone replacement carries certain risks, especially as you become older. If you are thinking about using estrogen or estrogen with progestin, discuss the benefits and risks with your health care provider. What is my risk for heart disease and stroke? The risk of heart disease, heart attack, and stroke increases as you age. One of the causes may be a change in the body's hormones during menopause. This can affect how your body uses dietary fats, triglycerides, and cholesterol. Heart attack and stroke are medical emergencies. There are many things that you can do to help prevent heart disease  and stroke. Watch your blood pressure  High blood pressure causes heart disease and increases the risk of stroke. This is more likely to develop in people who have high blood pressure readings, are of African descent, or are overweight.  Have your blood pressure checked: ? Every 3-5 years if you are 78-68 years of age. ? Every year if you are 26 years old or older. Eat a healthy diet   Eat a diet that includes plenty of vegetables, fruits, low-fat dairy products, and lean protein.  Do not eat a lot of foods that are high in solid fats, added sugars, or sodium. Get regular exercise Get regular exercise. This is one of the most important things you can do for your health. Most adults should:  Try to exercise for at least 150 minutes each week. The exercise should increase your heart rate and make you sweat (moderate-intensity exercise).  Try to do strengthening exercises at least twice each week. Do these in addition to the moderate-intensity exercise.  Spend less time sitting. Even light physical activity can be beneficial. Other tips  Work with your health care provider to achieve or maintain a healthy weight.  Do not use any products that contain nicotine or tobacco, such as cigarettes, e-cigarettes, and chewing tobacco. If you need help quitting, ask your health care provider.  Know your numbers. Ask your health care provider to check your cholesterol and your blood sugar (glucose). Continue to have your blood tested as directed by your health care provider. Do I need screening for cancer? Depending on your health history and family history, you may need to have cancer  screening at different stages of your life. This may include screening for:  Breast cancer.  Cervical cancer.  Lung cancer.  Colorectal cancer. What is my risk for osteoporosis? After menopause, you may be at increased risk for osteoporosis. Osteoporosis is a condition in which bone destruction happens more  quickly than new bone creation. To help prevent osteoporosis or the bone fractures that can happen because of osteoporosis, you may take the following actions:  If you are 43-36 years old, get at least 1,000 mg of calcium and at least 600 mg of vitamin D per day.  If you are older than age 5 but [place] than age 47, get at least 1,200 mg of calcium and at least 600 mg of vitamin D per day.  If you are older than age 32, get at least 1,200 mg of calcium and at least 800 mg of vitamin D per day. Smoking and drinking excessive alcohol increase the risk of osteoporosis. Eat foods that are rich in calcium and vitamin D, and do weight-bearing exercises several times each week as directed by your health care provider. How does menopause affect my mental health? Depression may occur at any age, but it is more common as you become older. Common symptoms of depression include:  Low or sad mood.  Changes in sleep patterns.  Changes in appetite or eating patterns.  Feeling an overall lack of motivation or enjoyment of activities that you previously enjoyed.  Frequent crying spells. Talk with your health care provider if you think that you are experiencing depression. General instructions See your health care provider for regular wellness exams and vaccines. This may include:  Scheduling regular health, dental, and eye exams.  Getting and maintaining your vaccines. These include: ? Influenza vaccine. Get this vaccine each year before the flu season begins. ? Pneumonia vaccine. ? Shingles vaccine. ? Tetanus, diphtheria, and pertussis (Tdap) booster vaccine. Your health care provider may also recommend other immunizations. Tell your health care provider if you have ever been abused or do not feel safe at home. Summary  Menopause is a normal process in which your ability to get pregnant comes to an end.  This condition causes hot flashes, night sweats, decreased interest in sex, mood swings,  headaches, or lack of sleep.  Treatment for this condition may include hormone replacement therapy.  Take actions to keep yourself healthy, including exercising regularly, eating a healthy diet, watching your weight, and checking your blood pressure and blood sugar levels.  Get screened for cancer and depression. Make sure that you are up to date with all your vaccines. This information is not intended to replace advice given to you by your health care provider. Make sure you discuss any questions you have with your health care provider. Document Revised: 11/22/2018 Document Reviewed: 11/22/2018 Elsevier Patient Education  2020 [employer].

## 2020-11-17 NOTE — Progress Notes (Signed)
   Courtney Bates 23-Nov-1966 [id number]   History:  54 y.o. G1P0012 presents for annual exam. Postmenopausal - no HRT, no bleeding. Complains of hot flashes and night sweats. PCP managing HTN and anxiety/depression. She has an appointment next week to discuss.  Normal pap and mammogram history.   Gynecologic History No LMP recorded. Patient is postmenopausal.   Contraception: post menopausal status Last Pap: 11/14/2019. Results were: normal Last mammogram: 10/03/2020. Results were: normal Last colonoscopy: 2018. Results were: normal Last Dexa: Never  Past medical history, past surgical history, family history and social history were all reviewed and documented in the EPIC chart.  ROS:  A ROS was performed and pertinent positives and negatives are included.  Exam:  Vitals:   11/17/20 1438  BP: 134/80  Weight: (!) 302 lb (137 kg)  Height: 5\' 6"  (1.676 m)   Body mass index is 48.74 kg/m.  General appearance:  Normal Thyroid:  Symmetrical, normal in size, without palpable masses or nodularity. Respiratory  Auscultation:  Clear without wheezing or rhonchi Cardiovascular  Auscultation:  Regular rate, without rubs, murmurs or gallops  Edema/varicosities:  Not grossly evident Abdominal  Soft,nontender, without masses, guarding or rebound.  Liver/spleen:  No organomegaly noted  Hernia:  None appreciated  Skin  Inspection:  Grossly normal   Breasts: Examined lying and sitting.   Right: Without masses, retractions, discharge or axillary adenopathy.   Left: Without masses, retractions, discharge or axillary adenopathy. Gentitourinary   Inguinal/mons:  Normal without inguinal adenopathy  External genitalia:  Normal  BUS/Urethra/Skene's glands:  Normal  Vagina:  Normal  Cervix:  Normal  Uterus:  Difficult to palpate due to body habitus but no gross masses or tenderness  Adnexa/parametria:     Rt: Without masses or tenderness.   Lt: Without masses or tenderness.  Anus and  perineum: Normal  Digital rectal exam: Normal sphincter tone without palpated masses or tenderness  Assessment/Plan:  54 y.o. 40 for annual exam.   Well female exam with routine gynecological exam - Education provided on SBEs, importance of preventative screenings, current guidelines, high calcium diet, regular exercise, and multivitamin daily. Labs with PCP.   Hot flashes - Discussed treatment options to include lifestyle changes such as less clothing/fans/avoiding triggers/weight loss, OTC supplements, and HRT. She has a history of HTN so we will avoid HRT. She is willing to try OTC supplement and I provided her with some options. She was on Effexor in the past but has not been taking since she feels it was not helping her moods. She has an appointment with PCP next week to discuss anxiety/depression and I discussed with her that SSRI/SNRIs can help with hot flashes as well.   Screening for cervical cancer -normal Pap history.  Will repeat at 5-year interval per guidelines.  Screening for breast cancer -normal mammogram history.  Continue annual screenings.  Normal breast exam today.  Screening for colon cancer -normal screening colonoscopy in 2018.  She will repeat at GI's recommended interval.  Follow-up in 1 year for annual.       2019 [facility] [place], 2:56 PM 11/17/2020

## 2020-12-23 NOTE — Progress Notes (Signed)
Subjective:    Patient ID: Courtney Bates, female    DOB: 1966/04/25, 55 y.o.   MRN: [id number]  HPI The patient is here for an acute visit.   ? Something stuck in her ear:  Her right ear hurts and it feels like something is in it.  It started one week ago.  Her right side of her head was hurting yesteday and that went away.  She did try flushing out her ear with hydrogen peroxide and otc ear wax drops.  No change in hearing.  No cold symptoms.  She does have ringing in her right ear.     Medications and allergies reviewed with patient and updated if appropriate.  Patient Active Problem List   Diagnosis Date Noted  . Body mass index 45.0-49.9, adult (HCC) 02/19/2020  . Degenerative arthritis of knee, bilateral 05/15/2019  . OA (osteoarthritis) of knee 12/18/2017  . Palpitations 12/18/2017  . Patellar tendinitis of both knees 05/10/2017  . Iron deficiency 05/11/2016  . Routine general medical examination at a health care facility 04/08/2016  . Anxiety and depression 01/12/2016  . Migraine 09/08/2015  . Sleep disturbance 09/08/2015  . LVH (left ventricular hypertrophy) 01/30/2014  . Hypertension   . Morbid obesity (HCC)   . GERD (gastroesophageal reflux disease)     Current Outpatient Medications on File Prior to Visit  Medication Sig Dispense Refill  . amLODipine (NORVASC) 10 MG tablet Take 1 tablet (10 mg total) by mouth daily. 90 tablet 3  . Colchicine 0.6 MG CAPS Take 1 capsule by mouth daily as needed (Pseudogout pain). 30 capsule 2  . Diclofenac Sodium 2 % SOLN Place 2 g onto the skin 2 (two) times daily. 112 g 3  . fluticasone (FLONASE) 50 MCG/ACT nasal spray Place 2 sprays into both nostrils daily. 16 g 6  . HYDROcodone-acetaminophen (NORCO/VICODIN) 5-325 MG tablet Take 1 tablet by mouth every 6 (six) hours as needed. 15 tablet 0  . losartan-hydrochlorothiazide (HYZAAR) 100-25 MG tablet Take 1 tablet by mouth daily. 90 tablet 3  . meloxicam (MOBIC) 15 MG tablet  Take 1 tablet (15 mg total) by mouth daily. 30 tablet 0  . metoprolol succinate (TOPROL-XL) 25 MG 24 hr tablet Take 1 tablet (25 mg total) by mouth daily. 90 tablet 3  . Omega-3 Fatty Acids (FISH OIL) 1000 MG CAPS Take by mouth.    . pantoprazole (PROTONIX) 40 MG tablet TAKE 1 TABLET BY MOUTH EVERY DAY 90 tablet 3  . traMADol (ULTRAM) 50 MG tablet     . venlafaxine XR (EFFEXOR-XR) 150 MG 24 hr capsule Take 1 capsule (150 mg total) by mouth daily. 90 capsule 1  . VENTOLIN HFA 108 (90 Base) MCG/ACT inhaler TAKE 2 PUFFS BY MOUTH EVERY 4 HOURS AS NEEDED    . potassium chloride SA (KLOR-CON) 20 MEQ tablet Take 1 tablet (20 mEq total) by mouth daily for 5 days. 5 tablet 0   No current facility-administered medications on file prior to visit.    Past Medical History:  Diagnosis Date  . Allergy   . Anemia   . Depression   . Family history of thyroid disease   . Frequent headaches   . GERD (gastroesophageal reflux disease)   . Hypertension   . Hypertensive heart disease    ECHO 01/30/14 with concentric LVH  EF 60% and   . Morbid obesity [facility])     Past Surgical History:  Procedure Laterality Date  . ganglion cyst  2005  right wrist  . WISDOM TOOTH EXTRACTION      Social History   Socioeconomic History  . Marital status: Single    Spouse name: Not on file  . Number of children: 2  . Years of education: 60  . Highest education level: Not on file  Occupational History  . Occupation: Geographical information systems officer  Tobacco Use  . Smoking status: Never Smoker  . Smokeless tobacco: Never Used  Substance and Sexual Activity  . Alcohol use: Yes    Comment: once every 2-3 months  . Drug use: No  . Sexual activity: Not Currently  Other Topics Concern  . 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, works as a sewer, no exercise   Fun: Counselling psychologist, parks, traveling   Denies religious beliefs effecting health   Feels safe at home and denies abuse   Social Determinants of Manufacturing engineer Strain: Not on file  Food Insecurity: Not on file  Transportation Needs: Not on file  Physical Activity: Not on file  Stress: Not on file  Social Connections: Not on file    Family History  Problem Relation Age of Onset  . Hypertension Mother   . Arthritis Mother   . Other Father        brain tumor  . Heart disease Maternal Grandfather   . Heart disease Paternal Grandmother   . Heart disease Paternal Grandfather   . Colon cancer Other   . Esophageal cancer Neg Hx   . Pancreatic cancer Neg Hx   . Prostate cancer Neg Hx   . Rectal cancer Neg Hx   . Stomach cancer Neg Hx     Review of Systems  Constitutional: Negative for fever.  HENT: Positive for ear pain (R) and tinnitus (R). Negative for congestion, hearing loss and sinus pain.   Neurological: Positive for headaches (occ).       Objective:   Vitals:   12/24/20 1538  BP: (!) 142/78  Pulse: 97  Temp: 99.4 F (37.4 C)  SpO2: 97%   BP Readings from Last 3 Encounters:  12/24/20 (!) 142/78  11/17/20 134/80  06/09/20 132/80   Wt Readings from Last 3 Encounters:  12/24/20 (!) 302 lb 6.4 oz (137.2 kg)  11/17/20 (!) 302 lb (137 kg)  06/09/20 296 lb (134.3 kg)   Body mass index is 48.81 kg/m.   Physical Exam Constitutional:      General: She is not in acute distress.    Appearance: Normal appearance. She is not ill-appearing.  HENT:     Head: Normocephalic and atraumatic.     Right Ear: External ear normal. There is impacted cerumen.     Left Ear: Tympanic membrane, ear canal and external ear normal.  Musculoskeletal:     Cervical back: Neck supple. No tenderness.  Lymphadenopathy:     Cervical: No cervical adenopathy.  Skin:    General: Skin is warm and dry.  Neurological:     Mental Status: She is alert.       PRE-PROCEDURE EXAM: Right TM cannot be visualized due to total occlusion/impaction of the ear canal. PROCEDURE INDICATION: remove wax to visualize ear drum & relieve  discomfort CONSENT:  Verbal  PROCEDURE NOTE:   RIGHT EAR:  The CMA used a metal wax curette under direct vision with an otoscope to free the wax bolus from the ear wall and then successfully removed a small bit of wax. The ear was then irrigated with  warm water to remove about 50% of the wax in her ear. POST- PROCEDURE EXAM: TM unable to be visualized, erythema in lower ear canal from lavage.    I discussed with patient that we were unable to successfully remove all of the wax and further attempts may result in further injury to her ear canal.      Assessment & Plan:    See Problem List for Assessment and Plan of chronic medical problems.    This visit occurred during the SARS-CoV-2 public health emergency.  Safety protocols were in place, including screening questions prior to the visit, additional usage of staff PPE, and extensive cleaning of exam room while observing appropriate contact time as indicated for disinfecting solutions.

## 2020-12-24 ENCOUNTER — Other Ambulatory Visit: Payer: Self-pay

## 2020-12-24 ENCOUNTER — Ambulatory Visit: Payer: BLUE CROSS/BLUE SHIELD | Admitting: Internal Medicine

## 2020-12-24 ENCOUNTER — Encounter: Payer: Self-pay | Admitting: Internal Medicine

## 2020-12-24 DIAGNOSIS — H9201 Otalgia, right ear: Secondary | ICD-10-CM | POA: Diagnosis not present

## 2020-12-24 DIAGNOSIS — H6120 Impacted cerumen, unspecified ear: Secondary | ICD-10-CM | POA: Insufficient documentation

## 2020-12-24 DIAGNOSIS — H6121 Impacted cerumen, right ear: Secondary | ICD-10-CM

## 2020-12-24 NOTE — Assessment & Plan Note (Signed)
Acute Related to impacted to cerumen Ear lavage performed  - it was only 50% successful - see procedure note

## 2020-12-24 NOTE — Assessment & Plan Note (Addendum)
Acute R ear impacted with cerumen causing ear pain and tinnitus  Removed about 50% of the ear wax with curette/lavage There was some redness in the ear canal following procedure Discussed risk of further injury with continued efforts Discussed using water/H2O2 at home and ear wax removal drops Will refer to ENT

## 2020-12-24 NOTE — Patient Instructions (Addendum)
Your right ear was cleaned out today.    Continue attempts to clean out ear at home   A referral was ordered for ENT

## 2020-12-26 NOTE — Progress Notes (Signed)
Patient consent obtained. Irrigation with water and peroxide performed. Partial view of right tympanic membranes after procedure was done however ear was still impacted.   Patient tolerated procedure well. Referred to ENT for further wax removal.

## 2021-02-03 ENCOUNTER — Ambulatory Visit (INDEPENDENT_AMBULATORY_CARE_PROVIDER_SITE_OTHER): Payer: BLUE CROSS/BLUE SHIELD | Admitting: Otolaryngology

## 2021-04-15 ENCOUNTER — Other Ambulatory Visit: Payer: Self-pay

## 2021-04-15 ENCOUNTER — Encounter: Payer: Self-pay | Admitting: Internal Medicine

## 2021-04-15 ENCOUNTER — Ambulatory Visit: Payer: BLUE CROSS/BLUE SHIELD | Admitting: Internal Medicine

## 2021-04-15 VITALS — BP 136/82 | HR 89 | Temp 97.9°F | Resp 18 | Ht 66.0 in | Wt 310.6 lb

## 2021-04-15 DIAGNOSIS — I10 Essential (primary) hypertension: Secondary | ICD-10-CM | POA: Diagnosis not present

## 2021-04-15 DIAGNOSIS — R0683 Snoring: Secondary | ICD-10-CM | POA: Diagnosis not present

## 2021-04-15 DIAGNOSIS — E538 Deficiency of other specified B group vitamins: Secondary | ICD-10-CM | POA: Diagnosis not present

## 2021-04-15 DIAGNOSIS — M25561 Pain in right knee: Secondary | ICD-10-CM

## 2021-04-15 DIAGNOSIS — G8929 Other chronic pain: Secondary | ICD-10-CM

## 2021-04-15 DIAGNOSIS — M25562 Pain in left knee: Secondary | ICD-10-CM

## 2021-04-15 LAB — CBC
HCT: 39.7 % (ref 36.0–46.0)
Hemoglobin: 13.3 g/dL (ref 12.0–15.0)
MCHC: 33.5 g/dL (ref 30.0–36.0)
MCV: 79.1 fl (ref 78.0–100.0)
Platelets: 324 10*3/uL (ref 150.0–400.0)
RBC: 5.02 Mil/uL (ref 3.87–5.11)
RDW: 14.2 % (ref 11.5–15.5)
WBC: 10.2 10*3/uL (ref 4.0–10.5)

## 2021-04-15 LAB — HEMOGLOBIN A1C: Hgb A1c MFr Bld: 5.1 % (ref 4.6–6.5)

## 2021-04-15 LAB — VITAMIN B12: Vitamin B-12: 252 pg/mL (ref 211–911)

## 2021-04-15 MED ORDER — AMLODIPINE BESYLATE 10 MG PO TABS: 10.0000 mg | ORAL_TABLET | Freq: Every day | ORAL | 3 refills | Status: DC

## 2021-04-15 MED ORDER — MELOXICAM 15 MG PO TABS: 15.0000 mg | ORAL_TABLET | Freq: Every day | ORAL | 3 refills | Status: DC

## 2021-04-15 MED ORDER — LOSARTAN POTASSIUM-HCTZ 100-25 MG PO TABS: 1.0000 | ORAL_TABLET | Freq: Every day | ORAL | 3 refills | Status: DC

## 2021-04-15 MED ORDER — METOPROLOL SUCCINATE ER 25 MG PO TB24: 25.0000 mg | ORAL_TABLET | Freq: Every day | ORAL | 3 refills | Status: DC

## 2021-04-15 NOTE — Progress Notes (Signed)
   Subjective:   Patient ID: Courtney Bates, female    DOB: 08-14-1966, 55 y.o.   MRN: [id number]  HPI The patient is a 55 YO female coming in for transfer of care and concerns about bruising/iron deficiency (she has low iron levels in the past and is concerned about this again, also taking B12 pills and does not feel as good as she did on the injections), and weight (she has gained some weight recently and is trying to lose weight, is motivated for change, has some financial difficulties with getting the right food and [employer], is going to [facility] today to see if she can apply for a grant, she does have pain in knees associated with this), and tiredness (she is struggling with energy and tiredness, denies sleeping well and snores at night, wakes up sometimes, wakes feeling tired).   Review of Systems  Constitutional: Positive for activity change and unexpected weight change.  HENT: Negative.   Eyes: Negative.   Respiratory: Negative for cough, chest tightness and shortness of breath.   Cardiovascular: Negative for chest pain, palpitations and leg swelling.  Gastrointestinal: Negative for abdominal distention, abdominal pain, constipation, diarrhea, nausea and vomiting.  Musculoskeletal: Positive for arthralgias and gait problem.  Skin: Negative.   Psychiatric/Behavioral: Negative.     Objective:  Physical Exam Constitutional:      Appearance: She is well-developed. She is obese.  HENT:     Head: Normocephalic and atraumatic.  Cardiovascular:     Rate and Rhythm: Normal rate and regular rhythm.  Pulmonary:     Effort: Pulmonary effort is normal. No respiratory distress.     Breath sounds: Normal breath sounds. No wheezing or rales.  Abdominal:     General: Bowel sounds are normal. There is no distension.     Palpations: Abdomen is soft.     Tenderness: There is no abdominal tenderness. There is no rebound.  Musculoskeletal:        General: Tenderness present.     Cervical back: Normal  range of motion.  Skin:    General: Skin is warm and dry.  Neurological:     Mental Status: She is alert and oriented to person, place, and time.     Coordination: Coordination normal.     Vitals:   04/15/21 1002  BP: 136/82  Pulse: 89  Resp: 18  Temp: 97.9 F (36.6 C)  TempSrc: Oral  SpO2: 97%  Weight: (!) 310 lb 9.6 oz (140.9 kg)  Height: 5\' 6"  (1.676 m)    This visit occurred during the SARS-CoV-2 public health emergency.  Safety protocols were in place, including screening questions prior to the visit, additional usage of staff PPE, and extensive cleaning of exam room while observing appropriate contact time as indicated for disinfecting solutions.   Assessment & Plan:

## 2021-04-15 NOTE — Patient Instructions (Signed)
We will check the labs today.  We will get the home sleep test ordered.   We will get you in with the nutritionist.   Think about doing water aerobics as this can help with weight loss and be easier on the knees.

## 2021-04-16 ENCOUNTER — Encounter: Payer: Self-pay | Admitting: Internal Medicine

## 2021-04-16 DIAGNOSIS — M170 Bilateral primary osteoarthritis of knee: Secondary | ICD-10-CM | POA: Diagnosis not present

## 2021-04-16 DIAGNOSIS — R0683 Snoring: Secondary | ICD-10-CM | POA: Insufficient documentation

## 2021-04-16 DIAGNOSIS — G4733 Obstructive sleep apnea (adult) (pediatric): Secondary | ICD-10-CM | POA: Insufficient documentation

## 2021-04-16 DIAGNOSIS — E538 Deficiency of other specified B group vitamins: Secondary | ICD-10-CM | POA: Insufficient documentation

## 2021-04-16 NOTE — Assessment & Plan Note (Signed)
Previously on shots and will check B12 as she is taking oral and feeling tired. Adjust as needed.

## 2021-04-16 NOTE — Assessment & Plan Note (Signed)
Rx meloxicam and she knows weight is a factor and she is motivated to work on this. She is struggling to function well.

## 2021-04-16 NOTE — Assessment & Plan Note (Addendum)
BP at goal on losartan/hctz and amlodipine. Refilled today and will check CMP at physical.

## 2021-04-16 NOTE — Assessment & Plan Note (Addendum)
Weight increasing still. She is motivated for change and if necessary I can fill out supportive forms for [employer] as I think specifically water aerobics can help her weight and joints. Referral to nutrition per her request to help motivate change and work on food options within budget.

## 2021-04-16 NOTE — Assessment & Plan Note (Addendum)
Checking home sleep test as OSA is a high likelihood and if needed CPAP could help with energy and cognition.

## 2021-04-26 DIAGNOSIS — R109 Unspecified abdominal pain: Secondary | ICD-10-CM | POA: Diagnosis not present

## 2021-04-26 DIAGNOSIS — R197 Diarrhea, unspecified: Secondary | ICD-10-CM | POA: Diagnosis not present

## 2021-04-26 DIAGNOSIS — R519 Headache, unspecified: Secondary | ICD-10-CM | POA: Diagnosis not present

## 2021-04-26 DIAGNOSIS — R6883 Chills (without fever): Secondary | ICD-10-CM | POA: Diagnosis not present

## 2021-04-26 DIAGNOSIS — R110 Nausea: Secondary | ICD-10-CM | POA: Diagnosis not present

## 2021-04-26 DIAGNOSIS — Z20822 Contact with and (suspected) exposure to covid-19: Secondary | ICD-10-CM | POA: Diagnosis not present

## 2021-05-19 ENCOUNTER — Ambulatory Visit (INDEPENDENT_AMBULATORY_CARE_PROVIDER_SITE_OTHER): Payer: BLUE CROSS/BLUE SHIELD | Admitting: Internal Medicine

## 2021-05-19 ENCOUNTER — Encounter: Payer: Self-pay | Admitting: Internal Medicine

## 2021-05-19 ENCOUNTER — Other Ambulatory Visit: Payer: Self-pay

## 2021-05-19 VITALS — BP 130/88 | HR 90 | Temp 98.2°F | Resp 18 | Ht 66.0 in | Wt 306.4 lb

## 2021-05-19 DIAGNOSIS — M170 Bilateral primary osteoarthritis of knee: Secondary | ICD-10-CM

## 2021-05-19 DIAGNOSIS — E538 Deficiency of other specified B group vitamins: Secondary | ICD-10-CM

## 2021-05-19 DIAGNOSIS — Z0000 Encounter for general adult medical examination without abnormal findings: Secondary | ICD-10-CM | POA: Diagnosis not present

## 2021-05-19 DIAGNOSIS — Z23 Encounter for immunization: Secondary | ICD-10-CM

## 2021-05-19 DIAGNOSIS — K219 Gastro-esophageal reflux disease without esophagitis: Secondary | ICD-10-CM | POA: Diagnosis not present

## 2021-05-19 DIAGNOSIS — I10 Essential (primary) hypertension: Secondary | ICD-10-CM

## 2021-05-19 MED ORDER — PANTOPRAZOLE SODIUM 40 MG PO TBEC: 1.0000 | DELAYED_RELEASE_TABLET | Freq: Every day | ORAL | 3 refills | Status: DC

## 2021-05-19 MED ORDER — CYANOCOBALAMIN 1000 MCG/ML IJ SOLN
1000.0000 ug | Freq: Once | INTRAMUSCULAR | Status: AC
  Administered 2021-05-19: 1000 ug via INTRAMUSCULAR

## 2021-05-19 NOTE — Assessment & Plan Note (Signed)
Given B12 shot today and will continue monthly.

## 2021-05-19 NOTE — Progress Notes (Signed)
   Subjective:   Patient ID: Courtney Bates, female    DOB: 01-14-1966, 55 y.o.   MRN: [id number]  HPI The patient is a 55 YO female coming in for physical.   PMH, FMH, social history reviewed and updated  Review of Systems  Constitutional: Negative.   HENT: Negative.   Eyes: Negative.   Respiratory: Negative for cough, chest tightness and shortness of breath.   Cardiovascular: Negative for chest pain, palpitations and leg swelling.  Gastrointestinal: Negative for abdominal distention, abdominal pain, constipation, diarrhea, nausea and vomiting.  Musculoskeletal: Negative.   Skin: Negative.   Neurological: Negative.   Psychiatric/Behavioral: Negative.     Objective:  Physical Exam Constitutional:      Appearance: She is well-developed. She is obese.  HENT:     Head: Normocephalic and atraumatic.  Cardiovascular:     Rate and Rhythm: Normal rate and regular rhythm.  Pulmonary:     Effort: Pulmonary effort is normal. No respiratory distress.     Breath sounds: Normal breath sounds. No wheezing or rales.  Abdominal:     General: Bowel sounds are normal. There is no distension.     Palpations: Abdomen is soft.     Tenderness: There is no abdominal tenderness. There is no rebound.  Musculoskeletal:     Cervical back: Normal range of motion.  Skin:    General: Skin is warm and dry.  Neurological:     Mental Status: She is alert and oriented to person, place, and time.     Coordination: Coordination normal.     Vitals:   05/19/21 1424  BP: 130/88  Pulse: 90  Resp: 18  Temp: 98.2 F (36.8 C)  TempSrc: Oral  SpO2: 96%  Weight: (!) 306 lb 6.4 oz (139 kg)  Height: 5\' 6"  (1.676 m)    This visit occurred during the SARS-CoV-2 public health emergency.  Safety protocols were in place, including screening questions prior to the visit, additional usage of staff PPE, and extensive cleaning of exam room while observing appropriate contact time as indicated for disinfecting  solutions.   Assessment & Plan:  B12 shot given today, shingrix IM given at visit

## 2021-05-19 NOTE — Assessment & Plan Note (Signed)
Flu shot yearly. Covid-19 2 shots and scheduled for booster. Shingrix given 1st. Tetanus up to date. Colonoscopy up to date. Mammogram up to date, pap smear up to date. Counseled about sun safety and mole surveillance. Counseled about the dangers of distracted driving. Given 10 year screening recommendations.

## 2021-05-19 NOTE — Assessment & Plan Note (Signed)
Refilled protonix to take daily.

## 2021-05-19 NOTE — Assessment & Plan Note (Signed)
Taking meloxicam.

## 2021-05-19 NOTE — Assessment & Plan Note (Signed)
BP at goal with metoprolol and amlodipine and losartan/hctz. Recent labs normal.

## 2021-05-19 NOTE — Patient Instructions (Addendum)
We have given you the shingles shot today and the B12 shot today.   Health Maintenance, Female Adopting a healthy lifestyle and getting preventive care are important in promoting health and wellness. Ask your health care provider about:  The right schedule for you to have regular tests and exams.  Things you can do on your own to prevent diseases and keep yourself healthy. What should I know about diet, weight, and exercise? Eat a healthy diet  Eat a diet that includes plenty of vegetables, fruits, low-fat dairy products, and lean protein.  Do not eat a lot of foods that are high in solid fats, added sugars, or sodium.   Maintain a healthy weight Body mass index (BMI) is used to identify weight problems. It estimates body fat based on height and weight. Your health care provider can help determine your BMI and help you achieve or maintain a healthy weight. Get regular exercise Get regular exercise. This is one of the most important things you can do for your health. Most adults should:  Exercise for at least 150 minutes each week. The exercise should increase your heart rate and make you sweat (moderate-intensity exercise).  Do strengthening exercises at least twice a week. This is in addition to the moderate-intensity exercise.  Spend less time sitting. Even light physical activity can be beneficial. Watch cholesterol and blood lipids Have your blood tested for lipids and cholesterol at 55 years of age, then have this test every 5 years. Have your cholesterol levels checked more often if:  Your lipid or cholesterol levels are high.  You are older than 55 years of age.  You are at high risk for heart disease. What should I know about cancer screening? Depending on your health history and family history, you may need to have cancer screening at various ages. This may include screening for:  Breast cancer.  Cervical cancer.  Colorectal cancer.  Skin cancer.  Lung  cancer. What should I know about heart disease, diabetes, and high blood pressure? Blood pressure and heart disease  High blood pressure causes heart disease and increases the risk of stroke. This is more likely to develop in people who have high blood pressure readings, are of African descent, or are overweight.  Have your blood pressure checked: ? Every 3-5 years if you are 77-32 years of age. ? Every year if you are 68 years old or older. Diabetes Have regular diabetes screenings. This checks your fasting blood sugar level. Have the screening done:  Once every three years after age 26 if you are at a normal weight and have a low risk for diabetes.  More often and at a younger age if you are overweight or have a high risk for diabetes. What should I know about preventing infection? Hepatitis B If you have a higher risk for hepatitis B, you should be screened for this virus. Talk with your health care provider to find out if you are at risk for hepatitis B infection. Hepatitis C Testing is recommended for:  Everyone born from [birth year] through 1965.  Anyone with known risk factors for hepatitis C. Sexually transmitted infections (STIs)  Get screened for STIs, including gonorrhea and chlamydia, if: ? You are sexually active and are younger than 55 years of age. ? You are older than 55 years of age and your health care provider tells you that you are at risk for this type of infection. ? Your sexual activity has changed since you were last screened,  and you are at increased risk for chlamydia or gonorrhea. Ask your health care provider if you are at risk.  Ask your health care provider about whether you are at high risk for HIV. Your health care provider may recommend a prescription medicine to help prevent HIV infection. If you choose to take medicine to prevent HIV, you should first get tested for HIV. You should then be tested every 3 months for as long as you are taking the  medicine. Pregnancy  If you are about to stop having your period (premenopausal) and you may become pregnant, seek counseling before you get pregnant.  Take 400 to 800 micrograms (mcg) of folic acid every day if you become pregnant.  Ask for birth control (contraception) if you want to prevent pregnancy. Osteoporosis and menopause Osteoporosis is a disease in which the bones lose minerals and strength with aging. This can result in bone fractures. If you are 62 years old or older, or if you are at risk for osteoporosis and fractures, ask your health care provider if you should:  Be screened for bone loss.  Take a calcium or vitamin D supplement to lower your risk of fractures.  Be given hormone replacement therapy (HRT) to treat symptoms of menopause. Follow these instructions at home: Lifestyle  Do not use any products that contain nicotine or tobacco, such as cigarettes, e-cigarettes, and chewing tobacco. If you need help quitting, ask your health care provider.  Do not use street drugs.  Do not share needles.  Ask your health care provider for help if you need support or information about quitting drugs. Alcohol use  Do not drink alcohol if: ? Your health care provider tells you not to drink. ? You are pregnant, may be pregnant, or are planning to become pregnant.  If you drink alcohol: ? Limit how much you use to 0-1 drink a day. ? Limit intake if you are breastfeeding.  Be aware of how much alcohol is in your drink. In the [place] one drink equals one 12 oz bottle of beer (355 mL), one 5 oz glass of wine (148 mL), or one 1 oz glass of hard liquor (44 mL). General instructions  Schedule regular health, dental, and eye exams.  Stay current with your vaccines.  Tell your health care provider if: ? You often feel depressed. ? You have ever been abused or do not feel safe at home. Summary  Adopting a healthy lifestyle and getting preventive care are important in  promoting health and wellness.  Follow your health care provider's instructions about healthy diet, exercising, and getting tested or screened for diseases.  Follow your health care provider's instructions on monitoring your cholesterol and blood pressure. This information is not intended to replace advice given to you by your health care provider. Make sure you discuss any questions you have with your health care provider. Document Revised: 11/22/2018 Document Reviewed: 11/22/2018 Elsevier Patient Education  2021 [employer].

## 2021-05-27 ENCOUNTER — Encounter: Payer: BLUE CROSS/BLUE SHIELD | Attending: Internal Medicine | Admitting: Dietician

## 2021-06-18 ENCOUNTER — Other Ambulatory Visit: Payer: Self-pay

## 2021-06-18 ENCOUNTER — Ambulatory Visit (INDEPENDENT_AMBULATORY_CARE_PROVIDER_SITE_OTHER): Payer: BLUE CROSS/BLUE SHIELD

## 2021-06-18 DIAGNOSIS — E538 Deficiency of other specified B group vitamins: Secondary | ICD-10-CM | POA: Diagnosis not present

## 2021-06-18 MED ORDER — CYANOCOBALAMIN 1000 MCG/ML IJ SOLN
1000.0000 ug | Freq: Once | INTRAMUSCULAR | Status: AC
  Administered 2021-06-18: 1000 ug via INTRAMUSCULAR

## 2021-06-18 NOTE — Progress Notes (Signed)
Patient came into the office to receive her vitamin b12 shot. She tolerated the injection well. She has been scheduled for her next one. No questions or concerns at this time.

## 2021-06-18 NOTE — Progress Notes (Signed)
Patient ID: Courtney Bates, female   DOB: 11-02-1966, 55 y.o.   MRN: [id number]  Medical screening examination/treatment/procedure(s) were performed by non-physician practitioner and as supervising physician I was immediately available for consultation/collaboration.  I agree with above. Oliver Barre, MD

## 2021-07-20 ENCOUNTER — Other Ambulatory Visit: Payer: Self-pay

## 2021-07-20 ENCOUNTER — Ambulatory Visit (INDEPENDENT_AMBULATORY_CARE_PROVIDER_SITE_OTHER): Payer: BLUE CROSS/BLUE SHIELD

## 2021-07-20 DIAGNOSIS — E538 Deficiency of other specified B group vitamins: Secondary | ICD-10-CM | POA: Diagnosis not present

## 2021-07-20 MED ORDER — CYANOCOBALAMIN 1000 MCG/ML IJ SOLN
1000.0000 ug | Freq: Once | INTRAMUSCULAR | Status: AC
  Administered 2021-07-20: 1000 ug via INTRAMUSCULAR

## 2021-07-20 NOTE — Progress Notes (Signed)
Pt given B12 injection w/o any complications. 

## 2021-07-22 ENCOUNTER — Ambulatory Visit: Payer: BLUE CROSS/BLUE SHIELD

## 2021-08-21 ENCOUNTER — Ambulatory Visit (INDEPENDENT_AMBULATORY_CARE_PROVIDER_SITE_OTHER): Payer: BLUE CROSS/BLUE SHIELD

## 2021-08-21 ENCOUNTER — Other Ambulatory Visit: Payer: Self-pay

## 2021-08-21 DIAGNOSIS — E538 Deficiency of other specified B group vitamins: Secondary | ICD-10-CM

## 2021-08-21 MED ORDER — CYANOCOBALAMIN 1000 MCG/ML IJ SOLN
1000.0000 ug | Freq: Once | INTRAMUSCULAR | Status: AC
  Administered 2021-08-21: 1000 ug via INTRAMUSCULAR

## 2021-08-21 NOTE — Progress Notes (Signed)
Pt here for monthly B12 injection per Dr. Okey Dupre  B12 given IM, and pt tolerated injection well.  Next B12 injection scheduled for 09/23/21

## 2021-08-27 DIAGNOSIS — M13862 Other specified arthritis, left knee: Secondary | ICD-10-CM | POA: Diagnosis not present

## 2021-08-27 DIAGNOSIS — M13861 Other specified arthritis, right knee: Secondary | ICD-10-CM | POA: Diagnosis not present

## 2021-09-23 ENCOUNTER — Other Ambulatory Visit: Payer: Self-pay

## 2021-09-23 ENCOUNTER — Ambulatory Visit (INDEPENDENT_AMBULATORY_CARE_PROVIDER_SITE_OTHER): Payer: BLUE CROSS/BLUE SHIELD

## 2021-09-23 DIAGNOSIS — E538 Deficiency of other specified B group vitamins: Secondary | ICD-10-CM | POA: Diagnosis not present

## 2021-09-23 MED ORDER — CYANOCOBALAMIN 1000 MCG/ML IJ SOLN
1000.0000 ug | Freq: Once | INTRAMUSCULAR | Status: AC
  Administered 2021-09-23: 1000 ug via INTRAMUSCULAR

## 2021-09-23 NOTE — Progress Notes (Signed)
Pt was given a B12 injection w/o any complications.

## 2021-10-15 DIAGNOSIS — M25571 Pain in right ankle and joints of right foot: Secondary | ICD-10-CM | POA: Diagnosis not present

## 2021-10-15 DIAGNOSIS — S8991XA Unspecified injury of right lower leg, initial encounter: Secondary | ICD-10-CM | POA: Diagnosis not present

## 2021-10-15 DIAGNOSIS — M25461 Effusion, right knee: Secondary | ICD-10-CM | POA: Diagnosis not present

## 2021-10-15 DIAGNOSIS — M1711 Unilateral primary osteoarthritis, right knee: Secondary | ICD-10-CM | POA: Diagnosis not present

## 2021-10-15 DIAGNOSIS — M25561 Pain in right knee: Secondary | ICD-10-CM | POA: Diagnosis not present

## 2021-10-16 ENCOUNTER — Emergency Department (HOSPITAL_COMMUNITY): Payer: BLUE CROSS/BLUE SHIELD

## 2021-10-16 ENCOUNTER — Emergency Department (HOSPITAL_COMMUNITY)
Admission: EM | Admit: 2021-10-16 | Discharge: 2021-10-16 | Disposition: A | Discharge status: Home / Self Care | Payer: BLUE CROSS/BLUE SHIELD | Attending: Emergency Medicine | Admitting: Emergency Medicine

## 2021-10-16 ENCOUNTER — Other Ambulatory Visit: Payer: Self-pay

## 2021-10-16 ENCOUNTER — Encounter (HOSPITAL_COMMUNITY): Payer: Self-pay | Admitting: Emergency Medicine

## 2021-10-16 DIAGNOSIS — M25561 Pain in right knee: Secondary | ICD-10-CM | POA: Diagnosis not present

## 2021-10-16 DIAGNOSIS — W1830XA Fall on same level, unspecified, initial encounter: Secondary | ICD-10-CM | POA: Insufficient documentation

## 2021-10-16 DIAGNOSIS — Z20822 Contact with and (suspected) exposure to covid-19: Secondary | ICD-10-CM | POA: Insufficient documentation

## 2021-10-16 DIAGNOSIS — R000 Tachycardia, unspecified: Secondary | ICD-10-CM | POA: Diagnosis not present

## 2021-10-16 DIAGNOSIS — M25461 Effusion, right knee: Secondary | ICD-10-CM | POA: Insufficient documentation

## 2021-10-16 DIAGNOSIS — Z79899 Other long term (current) drug therapy: Secondary | ICD-10-CM | POA: Insufficient documentation

## 2021-10-16 DIAGNOSIS — I10 Essential (primary) hypertension: Secondary | ICD-10-CM | POA: Diagnosis not present

## 2021-10-16 DIAGNOSIS — M25562 Pain in left knee: Secondary | ICD-10-CM | POA: Insufficient documentation

## 2021-10-16 DIAGNOSIS — M1711 Unilateral primary osteoarthritis, right knee: Secondary | ICD-10-CM | POA: Diagnosis not present

## 2021-10-16 LAB — RESP PANEL BY RT-PCR (FLU A&B, COVID) ARPGX2
Influenza A by PCR: NEGATIVE
Influenza B by PCR: NEGATIVE
SARS Coronavirus 2 by RT PCR: NEGATIVE

## 2021-10-16 MED ORDER — PREDNISONE 10 MG (21) PO TBPK: ORAL_TABLET | Freq: Every day | ORAL | 0 refills | Status: DC

## 2021-10-16 MED ORDER — OXYCODONE-ACETAMINOPHEN 5-325 MG PO TABS
2.0000 | ORAL_TABLET | Freq: Once | ORAL | Status: AC
  Administered 2021-10-16: 2 via ORAL
  Filled 2021-10-16: qty 2

## 2021-10-16 MED ORDER — KETOROLAC TROMETHAMINE 15 MG/ML IJ SOLN
15.0000 mg | Freq: Once | INTRAMUSCULAR | Status: AC
  Administered 2021-10-16: 15 mg via INTRAMUSCULAR

## 2021-10-16 MED ORDER — KETOROLAC TROMETHAMINE 15 MG/ML IJ SOLN
15.0000 mg | Freq: Once | INTRAMUSCULAR | Status: DC
  Filled 2021-10-16: qty 1

## 2021-10-16 MED ORDER — HYDROCODONE-ACETAMINOPHEN 5-325 MG PO TABS: 2.0000 | ORAL_TABLET | ORAL | 0 refills | Status: DC | PRN

## 2021-10-16 NOTE — Discharge Instructions (Addendum)
Please use Tylenol or ibuprofen for pain.  You may use 600 mg ibuprofen every 6 hours or 1000 mg of Tylenol every 6 hours.  You may choose to alternate between the 2.  This would be most effective.  Not to exceed 4 g of Tylenol within 24 hours.  Not to exceed 3200 mg ibuprofen 24 hours.  You can use the norco as needed for breakthrough pain in addition to the above. Please follow up on the results of your COVID / flu clinic on your patient portal as discussed.

## 2021-10-16 NOTE — ED Provider Notes (Signed)
[facility]-EMERGENCY DEPT Provider Note   CSN: [id number] Arrival date & time: 10/16/21  1913     History Chief Complaint  Patient presents with   Knee Pain    Courtney Bates is a 55 y.o. female with a past medical history significant for chronic arthritis of the right knee who presents with worsening bilateral knee pain after sustaining a fall yesterday.  Patient was seen and evaluated at urgent care, no acute fracture seen, small effusion seen in the right knee.  Patient reports that pain is worsened, she is also endorsing some chills today.  Son is worried because he thinks that she may have had a blood clot, or other illness with her pain, subacute fever. No history of PE, no SOB, no hemoptysis, no OCP use.   Knee Pain     Past Medical History:  Diagnosis Date   Allergy    Anemia    Depression    Family history of thyroid disease    Frequent headaches    GERD (gastroesophageal reflux disease)    Hypertension    Hypertensive heart disease    ECHO 01/30/14 with concentric LVH  EF 60% and    Morbid obesity [facility])     Patient Active Problem List   Diagnosis Date Noted   Snoring 04/16/2021   B12 deficiency 04/16/2021   Cerumen impaction 12/24/2020   Ear pain, right 12/24/2020   Degenerative arthritis of knee, bilateral 05/15/2019   OA (osteoarthritis) of knee 12/18/2017   Palpitations 12/18/2017   Bilateral knee pain 05/10/2017   Iron deficiency 05/11/2016   Routine general medical examination at a health care facility 04/08/2016   Migraine 09/08/2015   Sleep disturbance 09/08/2015   LVH (left ventricular hypertrophy) 01/30/2014   Hypertension    Morbid obesity (HCC)    GERD (gastroesophageal reflux disease)     Past Surgical History:  Procedure Laterality Date   ganglion cyst  2005   right wrist   WISDOM TOOTH EXTRACTION       OB History     Gravida  1   Para      Term      Preterm      AB  1   Living  2      SAB  1    IAB      Ectopic      Multiple      Live Births              Family History  Problem Relation Age of Onset   Hypertension Mother    Arthritis Mother    Other Father        brain tumor   Heart disease Maternal Grandfather    Heart disease Paternal Grandmother    Heart disease Paternal Grandfather    Colon cancer Other    Esophageal cancer Neg Hx    Pancreatic cancer Neg Hx    Prostate cancer Neg Hx    Rectal cancer Neg Hx    Stomach cancer Neg Hx     Social History   Tobacco Use   Smoking status: Never   Smokeless tobacco: Never  Substance Use Topics   Alcohol use: Yes    Comment: once every 2-3 months   Drug use: No    Home Medications Prior to Admission medications   Medication Sig Start Date End Date Taking? Authorizing Provider  HYDROcodone-acetaminophen (NORCO/VICODIN) 5-325 MG tablet Take 2 tablets by mouth every 4 (four) hours as  needed. 10/16/21  Yes Danasia Baker H, PA-C  predniSONE (STERAPRED UNI-PAK 21 TAB) 10 MG (21) TBPK tablet Take by mouth daily. Take 6 tabs by mouth daily  for 2 days, then 5 tabs for 2 days, then 4 tabs for 2 days, then 3 tabs for 2 days, 2 tabs for 2 days, then 1 tab by mouth daily for 2 days 10/16/21  Yes Christine Morton H, PA-C  amLODipine (NORVASC) 10 MG tablet Take 1 tablet (10 mg total) by mouth daily. 04/15/21   Myrlene Broker, MD  losartan-hydrochlorothiazide (HYZAAR) 100-25 MG tablet Take 1 tablet by mouth daily. 04/15/21   Myrlene Broker, MD  meloxicam (MOBIC) 15 MG tablet Take 1 tablet (15 mg total) by mouth daily. 04/15/21   Myrlene Broker, MD  metoprolol succinate (TOPROL-XL) 25 MG 24 hr tablet Take 1 tablet (25 mg total) by mouth daily. 04/15/21   Myrlene Broker, MD  pantoprazole (PROTONIX) 40 MG tablet Take 1 tablet (40 mg total) by mouth daily. 05/19/21   Myrlene Broker, MD    Allergies    Penicillins  Review of Systems   Review of Systems  Constitutional:  Positive for chills.   Musculoskeletal:  Positive for gait problem and joint swelling.  All other systems reviewed and are negative.  Physical Exam Updated Vital Signs BP (!) 197/103   Pulse (!) 105   Temp 100 F (37.8 C) (Oral)   Resp 18   SpO2 95%   Physical Exam Vitals and nursing note reviewed.  Constitutional:      General: She is in acute distress.     Appearance: Normal appearance.     Comments: Patient appears in some distress on examination  HENT:     Head: Normocephalic and atraumatic.  Eyes:     General:        Right eye: No discharge.        Left eye: No discharge.  Cardiovascular:     Rate and Rhythm: Regular rhythm. Tachycardia present.     Pulses: Normal pulses.     Heart sounds: No murmur heard.   No friction rub. No gallop.  Pulmonary:     Effort: Pulmonary effort is normal.     Breath sounds: Normal breath sounds.  Abdominal:     General: Bowel sounds are normal.     Palpations: Abdomen is soft.  Musculoskeletal:     Comments: There is some swelling on the right knee compared to the left, without redness, significant temperature change.  No redness or swelling in the calf suggestive of acute DVT.  Patient has decreased strength to flexion, extension of the right knee compared to the left secondary to pain.  Intact strength 5 out of 5 throughout left lower extremity.  No anterior, posterior drawer laxity, no varus, valgus laxity bilaterally.  Skin:    General: Skin is warm and dry.     Capillary Refill: Capillary refill takes less than 2 seconds.  Neurological:     Mental Status: She is alert and oriented to person, place, and time.  Psychiatric:        Mood and Affect: Mood normal.        Behavior: Behavior normal.    ED Results / Procedures / Treatments   Labs (all labs ordered are listed, but only abnormal results are displayed) Labs Reviewed  RESP PANEL BY RT-PCR (FLU A&B, COVID) [id number]    EKG None  Radiology CT Knee Right Wo Contrast  Result  Date:  10/16/2021 CLINICAL DATA:  Recent fall with knee pain, initial encounter EXAM: CT OF THE RIGHT KNEE WITHOUT CONTRAST TECHNIQUE: Multidetector CT imaging of the right knee was performed according to the standard protocol. Multiplanar CT image reconstructions were also generated. COMPARISON:  Plain film from 02/19/2020 FINDINGS: Bones/Joint/Cartilage Tricompartmental degenerative changes are noted. No acute fracture or dislocation is seen. Ligaments Suboptimally assessed by CT. Posterior cruciate ligament is intact. The anterior cruciate ligament is not discretely visualized. Collateral ligaments appear intact. Muscles and Tendons Surrounding musculature is within normal limits. Soft tissues Prepatellar soft tissue swelling is noted as well as a large joint effusion. IMPRESSION: No acute fracture is noted. Tricompartmental degenerative change is seen. Large joint effusion related to the recent injury. This somewhat limits evaluation of the underlying ligaments. The anterior cruciate ligament is not discretely visualized. NONEMERGENT MRI may be helpful for further evaluation. Electronically Signed   By: Alcide Clever M.D.   On: 10/16/2021 22:19    Procedures Procedures   Medications Ordered in ED Medications  oxyCODONE-acetaminophen (PERCOCET/ROXICET) 5-325 MG per tablet 2 tablet (2 tablets Oral Given 10/16/21 2041)  ketorolac (TORADOL) 15 MG/ML injection 15 mg (15 mg Intramuscular Given 10/16/21 2040)    ED Course  I have reviewed the triage vital signs and the nursing notes.  Pertinent labs & imaging results that were available during my care of the patient were reviewed by me and considered in my medical decision making (see chart for details).    MDM Rules/Calculators/A&P                         I discussed this case with my attending physician who cosigned this note including patient's presenting symptoms, physical exam, and planned diagnostics and interventions. Attending physician stated  agreement with plan or made changes to plan which were implemented.   Attending physician assessed patient at bedside.  Overall impression of the left lower extremity not significant for concern for infection, septic arthritis, neurovascular compromise of any kind.  There is some minimal excoriation overlying the left knee consistent with fall.  Right knee is swollen in comparison with left knee without acute redness, swelling to suggest septic arthritis at this time.  Patient has distal pulses that are intact.  Patient with no other symptoms other than her knee pain, however slightly tachycardic, subclinically febrile. Concern for possible occult tibial plateau fracture. Suspicion for acute viral illness on top of non-infectious acute MSK pain. Will also performed covid / flu swab at this time. Patient understands and agrees to plan.  CT of the right knee shows no acute fracture, worsening effusion.  Respiratory panel still pending at time of discharge.  Patient discharged with Norco, short course of steroids, plan to follow-up with orthopedics. Final Clinical Impression(s) / ED Diagnoses Final diagnoses:  Acute pain of both knees  Effusion of right knee    Rx / DC Orders ED Discharge Orders          Ordered    predniSONE (STERAPRED UNI-PAK 21 TAB) 10 MG (21) TBPK tablet  Daily        10/16/21 2306    HYDROcodone-acetaminophen (NORCO/VICODIN) 5-325 MG tablet  Every 4 hours PRN        10/16/21 2306             Carel Schnee, Harrel Carina, PA-C 10/16/21 2308    Alvira Monday, MD 10/17/21 1144

## 2021-10-16 NOTE — ED Triage Notes (Signed)
Patient presents post fall yesterday. She fell on her knees. She now complains of pain. She was seen at urgent care yesterday an cleared. She is here today because she still has pain.

## 2021-10-17 DIAGNOSIS — M25561 Pain in right knee: Secondary | ICD-10-CM | POA: Diagnosis not present

## 2021-10-17 DIAGNOSIS — M1711 Unilateral primary osteoarthritis, right knee: Secondary | ICD-10-CM | POA: Diagnosis not present

## 2021-10-21 DIAGNOSIS — M25561 Pain in right knee: Secondary | ICD-10-CM | POA: Diagnosis not present

## 2021-10-27 DIAGNOSIS — M7650 Patellar tendinitis, unspecified knee: Secondary | ICD-10-CM | POA: Diagnosis not present

## 2021-10-27 DIAGNOSIS — S83241A Other tear of medial meniscus, current injury, right knee, initial encounter: Secondary | ICD-10-CM | POA: Diagnosis not present

## 2021-10-27 DIAGNOSIS — M25561 Pain in right knee: Secondary | ICD-10-CM | POA: Diagnosis not present

## 2021-10-27 DIAGNOSIS — M13861 Other specified arthritis, right knee: Secondary | ICD-10-CM | POA: Diagnosis not present

## 2021-10-28 ENCOUNTER — Ambulatory Visit: Payer: BLUE CROSS/BLUE SHIELD

## 2021-10-30 ENCOUNTER — Ambulatory Visit: Payer: BLUE CROSS/BLUE SHIELD

## 2021-11-02 ENCOUNTER — Other Ambulatory Visit: Payer: Self-pay

## 2021-11-02 ENCOUNTER — Ambulatory Visit (INDEPENDENT_AMBULATORY_CARE_PROVIDER_SITE_OTHER): Payer: BLUE CROSS/BLUE SHIELD

## 2021-11-02 DIAGNOSIS — E538 Deficiency of other specified B group vitamins: Secondary | ICD-10-CM

## 2021-11-02 MED ORDER — CYANOCOBALAMIN 1000 MCG/ML IJ SOLN
1000.0000 ug | Freq: Once | INTRAMUSCULAR | Status: AC
Start: 2021-11-02 — End: 2021-11-02
  Administered 2021-11-02: 1000 ug via INTRAMUSCULAR

## 2021-11-02 NOTE — Progress Notes (Signed)
Pt given B12 w/o any complications. °

## 2021-11-18 ENCOUNTER — Encounter: Payer: Self-pay | Admitting: Nurse Practitioner

## 2021-11-18 ENCOUNTER — Other Ambulatory Visit: Payer: Self-pay

## 2021-11-18 ENCOUNTER — Ambulatory Visit (INDEPENDENT_AMBULATORY_CARE_PROVIDER_SITE_OTHER): Payer: BLUE CROSS/BLUE SHIELD | Admitting: Nurse Practitioner

## 2021-11-18 VITALS — BP 132/82 | HR 102 | Ht 66.0 in | Wt 307.0 lb

## 2021-11-18 DIAGNOSIS — N951 Menopausal and female climacteric states: Secondary | ICD-10-CM | POA: Diagnosis not present

## 2021-11-18 DIAGNOSIS — Z01419 Encounter for gynecological examination (general) (routine) without abnormal findings: Secondary | ICD-10-CM

## 2021-11-18 DIAGNOSIS — Z780 Asymptomatic menopausal state: Secondary | ICD-10-CM

## 2021-11-18 MED ORDER — CITALOPRAM HYDROBROMIDE 20 MG PO TABS: 20.0000 mg | ORAL_TABLET | Freq: Every day | ORAL | 2 refills | Status: DC

## 2021-11-18 NOTE — Progress Notes (Signed)
   Courtney Bates Apr 01, 1966 [id number]   History:  55 y.o. G1P0012 presents for annual exam. Postmenopausal - no HRT, no bleeding. She is experiencing significant hot flashes, night sweats, and mood changes. Normal pap and mammogram history. PCP managing HTN.   Gynecologic History No LMP recorded. Patient is postmenopausal. Contraception: post menopausal status   Sexually active: No  Health maintenance Last Pap: 11/14/2019. Results were: Normal Last mammogram: 10/03/2020. Results were: Normal Last colonoscopy: 02/07/2017. Results were: Normal, 10-year recall Last Dexa: Never  Past medical history, past surgical history, family history and social history were all reviewed and documented in the EPIC chart. Single. 18 yo daughter, son. 3 grandchildren ages 54 months, 5 and 9 years. 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   ROS:  A ROS was performed and pertinent positives and negatives are included.  Exam:  Vitals:   11/18/21 1421  BP: 132/82  Pulse: (!) 102  SpO2: 96%  Weight: (!) 307 lb (139.3 kg)  Height: 5\' 6"  (1.676 m)    Body mass index is 49.55 kg/m.  General appearance:  Normal Thyroid:  Symmetrical, normal in size, without palpable masses or nodularity. Respiratory  Auscultation:  Clear without wheezing or rhonchi Cardiovascular  Auscultation:  Regular rate, without rubs, murmurs or gallops  Edema/varicosities:  Not grossly evident Abdominal  Soft,nontender, without masses, guarding or rebound.  Liver/spleen:  No organomegaly noted  Hernia:  None appreciated  Skin  Inspection:  Grossly normal   Breasts: Examined lying and sitting.   Right: Without masses, retractions, discharge or axillary adenopathy.   Left: Without masses, retractions, discharge or axillary adenopathy. Gentitourinary   Inguinal/mons:  Normal without inguinal adenopathy  External genitalia:  Normal  BUS/Urethra/Skene's glands:  Normal  Vagina:  Normal  Cervix:   Normal  Uterus:  Difficult to palpate due to body habitus but no gross masses or tenderness  Adnexa/parametria:     Rt: Without masses or tenderness.   Lt: Without masses or tenderness.  Anus and perineum: Normal  Digital rectal exam: Normal sphincter tone without palpated masses or tenderness  Assessment/Plan:  55 y.o. 57 for annual exam.   Well female exam with routine gynecological exam - Education provided on SBEs, importance of preventative screenings, current guidelines, high calcium diet, regular exercise, and multivitamin daily. Labs with PCP.   Postmenopausal - no HRT, no bleeding.   Menopausal symptoms - Plan: citalopram (CELEXA) 20 MG tablet daily. She is experiencing significant hot flashes, night sweats, and depression. We discussed lifestyle modifications and medication management. She is on 3 medications for HTN, ASCVD 10-year risk of 6.0%. Recommend trying SSRI initially due to HTN. She is agreeable. We discussed proper use, side effects, and time frame of effectiveness.   Screening for cervical cancer - Normal Pap history.  Will repeat at 5-year interval per guidelines.  Screening for breast cancer - Normal mammogram history.  Continue annual screenings.  Normal breast exam today. Mammogram scheduled 12/19.  Screening for colon cancer - Normal screening colonoscopy in 2018.  She will repeat at 10-year interval per GI recommendation.   Follow-up in 1 year for annual.       2019 [facility], 2:58 PM 11/18/2021

## 2021-11-27 DIAGNOSIS — Z1231 Encounter for screening mammogram for malignant neoplasm of breast: Secondary | ICD-10-CM | POA: Diagnosis not present

## 2021-12-02 ENCOUNTER — Ambulatory Visit (INDEPENDENT_AMBULATORY_CARE_PROVIDER_SITE_OTHER): Payer: BLUE CROSS/BLUE SHIELD

## 2021-12-02 ENCOUNTER — Other Ambulatory Visit: Payer: Self-pay

## 2021-12-02 DIAGNOSIS — E538 Deficiency of other specified B group vitamins: Secondary | ICD-10-CM

## 2021-12-02 MED ORDER — CYANOCOBALAMIN 1000 MCG/ML IJ SOLN
1000.0000 ug | Freq: Once | INTRAMUSCULAR | Status: AC
  Administered 2021-12-02: 15:00:00 1000 ug via INTRAMUSCULAR

## 2021-12-02 NOTE — Progress Notes (Signed)
Pt was given B12 w/o any complications. 

## 2021-12-03 NOTE — Progress Notes (Signed)
error 

## 2021-12-12 ENCOUNTER — Other Ambulatory Visit: Payer: Self-pay | Admitting: Nurse Practitioner

## 2021-12-12 DIAGNOSIS — N951 Menopausal and female climacteric states: Secondary | ICD-10-CM

## 2021-12-15 NOTE — Telephone Encounter (Signed)
90 day Rx request.

## 2021-12-21 ENCOUNTER — Ambulatory Visit: Payer: BLUE CROSS/BLUE SHIELD | Admitting: Nurse Practitioner

## 2021-12-25 ENCOUNTER — Ambulatory Visit: Payer: BLUE CROSS/BLUE SHIELD | Admitting: Internal Medicine

## 2021-12-29 ENCOUNTER — Ambulatory Visit: Payer: BLUE CROSS/BLUE SHIELD | Admitting: Nurse Practitioner

## 2021-12-29 DIAGNOSIS — Z0289 Encounter for other administrative examinations: Secondary | ICD-10-CM

## 2022-01-04 DIAGNOSIS — M7651 Patellar tendinitis, right knee: Secondary | ICD-10-CM | POA: Diagnosis not present

## 2022-01-04 DIAGNOSIS — M25562 Pain in left knee: Secondary | ICD-10-CM | POA: Diagnosis not present

## 2022-01-04 DIAGNOSIS — M25561 Pain in right knee: Secondary | ICD-10-CM | POA: Diagnosis not present

## 2022-01-04 DIAGNOSIS — S83241D Other tear of medial meniscus, current injury, right knee, subsequent encounter: Secondary | ICD-10-CM | POA: Diagnosis not present

## 2022-03-16 DIAGNOSIS — M25561 Pain in right knee: Secondary | ICD-10-CM | POA: Diagnosis not present

## 2022-03-16 DIAGNOSIS — S83241A Other tear of medial meniscus, current injury, right knee, initial encounter: Secondary | ICD-10-CM | POA: Diagnosis not present

## 2022-03-16 DIAGNOSIS — M7650 Patellar tendinitis, unspecified knee: Secondary | ICD-10-CM | POA: Diagnosis not present

## 2022-03-16 DIAGNOSIS — M25562 Pain in left knee: Secondary | ICD-10-CM | POA: Diagnosis not present

## 2022-04-05 ENCOUNTER — Encounter: Payer: Self-pay | Admitting: Internal Medicine

## 2022-04-05 ENCOUNTER — Ambulatory Visit: Payer: BLUE CROSS/BLUE SHIELD | Admitting: Internal Medicine

## 2022-04-05 VITALS — BP 128/74 | HR 93 | Resp 18 | Ht 66.0 in | Wt 299.8 lb

## 2022-04-05 DIAGNOSIS — R233 Spontaneous ecchymoses: Secondary | ICD-10-CM

## 2022-04-05 DIAGNOSIS — E538 Deficiency of other specified B group vitamins: Secondary | ICD-10-CM | POA: Diagnosis not present

## 2022-04-05 DIAGNOSIS — R0683 Snoring: Secondary | ICD-10-CM

## 2022-04-05 DIAGNOSIS — H9201 Otalgia, right ear: Secondary | ICD-10-CM

## 2022-04-05 DIAGNOSIS — H6121 Impacted cerumen, right ear: Secondary | ICD-10-CM

## 2022-04-05 LAB — CBC WITH DIFFERENTIAL/PLATELET
Basophils Absolute: 0.1 10*3/uL (ref 0.0–0.1)
Basophils Relative: 0.4 % (ref 0.0–3.0)
Eosinophils Absolute: 0.2 10*3/uL (ref 0.0–0.7)
Eosinophils Relative: 1.1 % (ref 0.0–5.0)
HCT: 42.4 % (ref 36.0–46.0)
Hemoglobin: 14.2 g/dL (ref 12.0–15.0)
Lymphocytes Relative: 19.2 % (ref 12.0–46.0)
Lymphs Abs: 2.6 10*3/uL (ref 0.7–4.0)
MCHC: 33.6 g/dL (ref 30.0–36.0)
MCV: 80.2 fl (ref 78.0–100.0)
Monocytes Absolute: 0.8 10*3/uL (ref 0.1–1.0)
Monocytes Relative: 6 % (ref 3.0–12.0)
Neutro Abs: 9.8 10*3/uL — ABNORMAL HIGH (ref 1.4–7.7)
Neutrophils Relative %: 73.3 % (ref 43.0–77.0)
Platelets: 362 10*3/uL (ref 150.0–400.0)
RBC: 5.28 Mil/uL — ABNORMAL HIGH (ref 3.87–5.11)
RDW: 14.3 % (ref 11.5–15.5)
WBC: 13.4 10*3/uL — ABNORMAL HIGH (ref 4.0–10.5)

## 2022-04-05 LAB — LIPID PANEL
Cholesterol: 197 mg/dL (ref 0–200)
HDL: 44.5 mg/dL (ref >39.00)
LDL Cholesterol: 115 mg/dL — ABNORMAL HIGH (ref 0–99)
NonHDL: 152.35
Total CHOL/HDL Ratio: 4
Triglycerides: 187 mg/dL — ABNORMAL HIGH (ref 0.0–149.0)
VLDL: 37.4 mg/dL (ref 0.0–40.0)

## 2022-04-05 LAB — COMPREHENSIVE METABOLIC PANEL
ALT: 9 U/L (ref 0–35)
AST: 14 U/L (ref 0–37)
Albumin: 4.5 g/dL (ref 3.5–5.2)
Alkaline Phosphatase: 97 U/L (ref 39–117)
BUN: 14 mg/dL (ref 6–23)
CO2: 28 mEq/L (ref 19–32)
Calcium: 9.5 mg/dL (ref 8.4–10.5)
Chloride: 103 mEq/L (ref 96–112)
Creatinine, Ser: 1.01 mg/dL (ref 0.40–1.20)
GFR: 62.75 mL/min (ref >60.00)
Glucose, Bld: 102 mg/dL — ABNORMAL HIGH (ref 70–99)
Potassium: 3.3 mEq/L — ABNORMAL LOW (ref 3.5–5.1)
Sodium: 140 mEq/L (ref 135–145)
Total Bilirubin: 0.5 mg/dL (ref 0.2–1.2)
Total Protein: 7.3 g/dL (ref 6.0–8.3)

## 2022-04-05 LAB — HEMOGLOBIN A1C: Hgb A1c MFr Bld: 5.2 % (ref 4.6–6.5)

## 2022-04-05 LAB — VITAMIN B12: Vitamin B-12: 360 pg/mL (ref 211–911)

## 2022-04-05 MED ORDER — NEOMYCIN-POLYMYXIN-HC 1 % OT SOLN: 3.0000 [drp] | Freq: Three times a day (TID) | OTIC | 0 refills | Status: DC

## 2022-04-05 NOTE — Progress Notes (Signed)
? ?  Subjective:  ? ?Patient ID: Courtney Bates, female    DOB: 07/04/1966, 56 y.o.   MRN: [id number] ? ?HPI ?The patient is a 56 YO female coming in for headaches and sinus issues. Never got home sleep test and still interested in this. ? ?Review of Systems  ?Constitutional:  Positive for fatigue.  ?HENT:  Positive for congestion and ear pain.   ?Eyes: Negative.   ?Respiratory:  Negative for cough, chest tightness and shortness of breath.   ?Cardiovascular:  Negative for chest pain, palpitations and leg swelling.  ?Gastrointestinal:  Negative for abdominal distention, abdominal pain, constipation, diarrhea, nausea and vomiting.  ?Musculoskeletal: Negative.   ?Skin: Negative.   ?Neurological:  Positive for headaches.  ?Psychiatric/Behavioral:  Positive for sleep disturbance.   ? ?Objective:  ?Physical Exam ?Constitutional:   ?   Appearance: She is well-developed. She is obese.  ?HENT:  ?   Head: Normocephalic and atraumatic.  ?   Right Ear: Tympanic membrane normal.  ?   Left Ear: Tympanic membrane normal.  ?   Ears:  ?   Comments:  right ear canal impacted with copious hard wax, examination post ear lavage canal is clear and no bleeding or complications noted. ? ? ?Cardiovascular:  ?   Rate and Rhythm: Normal rate and regular rhythm.  ?Pulmonary:  ?   Effort: Pulmonary effort is normal. No respiratory distress.  ?   Breath sounds: Normal breath sounds. No wheezing or rales.  ?Abdominal:  ?   General: Bowel sounds are normal. There is no distension.  ?   Palpations: Abdomen is soft.  ?   Tenderness: There is no abdominal tenderness. There is no rebound.  ?Musculoskeletal:  ?   Cervical back: Normal range of motion.  ?Skin: ?   General: Skin is warm and dry.  ?Neurological:  ?   Mental Status: She is alert and oriented to person, place, and time.  ?   Coordination: Coordination normal.  ? ? ?Vitals:  ? 04/05/22 1419  ?BP: 128/74  ?Pulse: 93  ?Resp: 18  ?SpO2: 96%  ?Weight: 299 lb 12.8 oz (136 kg)  ?Height: 5\' 6"   (1.676 m)  ? ? ?This visit occurred during the SARS-CoV-2 public health emergency.  Safety protocols were in place, including screening questions prior to the visit, additional usage of staff PPE, and extensive cleaning of exam room while observing appropriate contact time as indicated for disinfecting solutions.  ? ?Assessment & Plan:  ? ?

## 2022-04-06 DIAGNOSIS — R233 Spontaneous ecchymoses: Secondary | ICD-10-CM | POA: Insufficient documentation

## 2022-04-06 NOTE — Assessment & Plan Note (Signed)
Did a few B12 injections but not consistently. Checking B12 level and adjust as needed.  ?

## 2022-04-06 NOTE — Assessment & Plan Note (Signed)
Checking CBC with diff to assess for risk. Not taking meloxicam regularly. ?

## 2022-04-06 NOTE — Assessment & Plan Note (Signed)
Checking lipid panel and HgA1c to screen for complications. Counseled about weight and exercise/diet.  ?

## 2022-04-06 NOTE — Assessment & Plan Note (Signed)
Ear disimpacted today without relief of symptoms.  ?

## 2022-04-06 NOTE — Assessment & Plan Note (Signed)
Ear irrigated which did clear ear but did not relieve pain. Ear with some bulging to the TM clear fluid. Rx cortisporin ear drops to help. Referral done to ENT since this is a recurrent problem. ?

## 2022-04-06 NOTE — Assessment & Plan Note (Signed)
New order for home sleep test as this was not done since ordered last year. She is having significant morning headaches, borderline BP readings. Suspect OSA and agrees with treatment if needed.  ?

## 2022-04-15 DIAGNOSIS — R059 Cough, unspecified: Secondary | ICD-10-CM | POA: Diagnosis not present

## 2022-04-15 DIAGNOSIS — U071 COVID-19: Secondary | ICD-10-CM | POA: Diagnosis not present

## 2022-04-30 IMAGING — CT CT KNEE*R* W/O CM
3 series · 16 of 33 positions shown, 19 images · non-contrast
Comparison: Plain film from 02/19/2020

CLINICAL DATA: Recent fall with knee pain, initial encounter

EXAM:
CT OF THE RIGHT KNEE WITHOUT CONTRAST
TECHNIQUE: Multidetector CT imaging of the right knee was performed according
to the standard protocol. Multiplanar CT image reconstructions were
also generated.

[Series 4: axial st · axial · 0.47mm/px · z∈[-298,-146]mm · 8 of 90 slices shown, 10 images]
[im 7/90  soft-tissue]
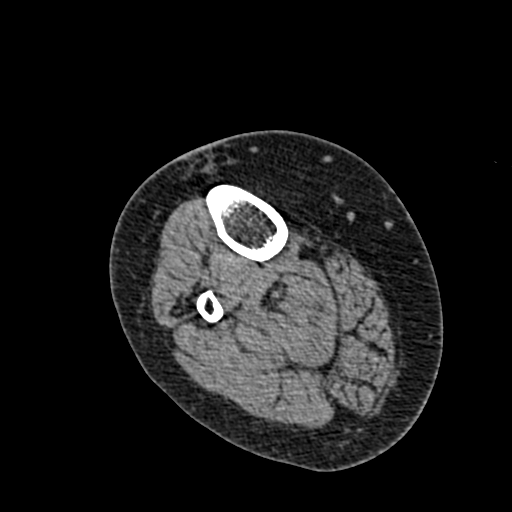
[im 7/90  bone]
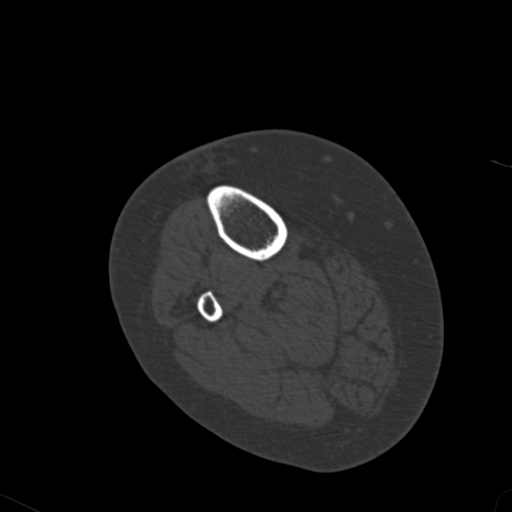
[im 21/90  bone]
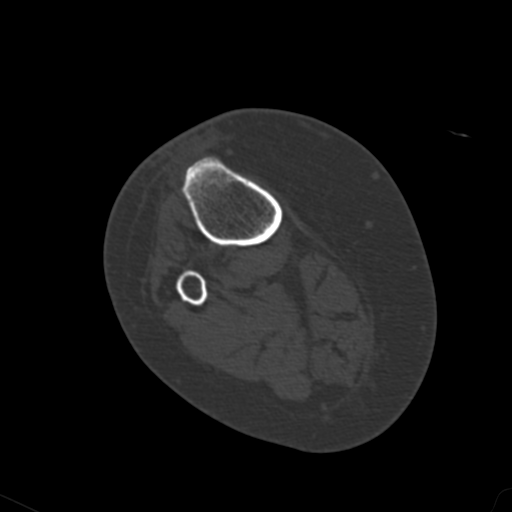
[im 28/90  bone]
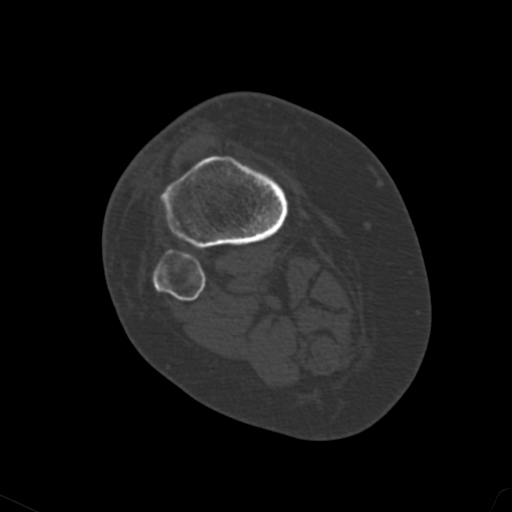
[im 42/90  bone]
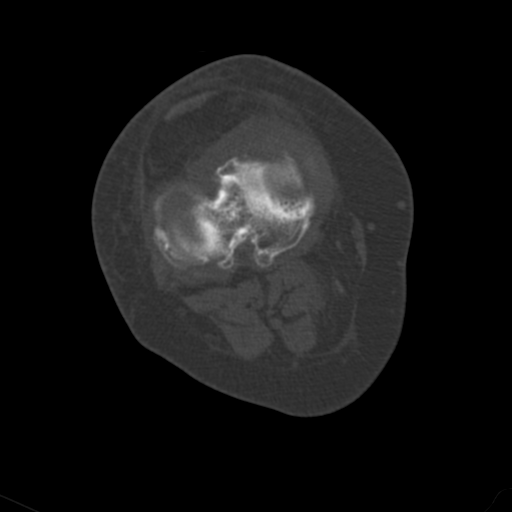
[im 48/90  soft-tissue]
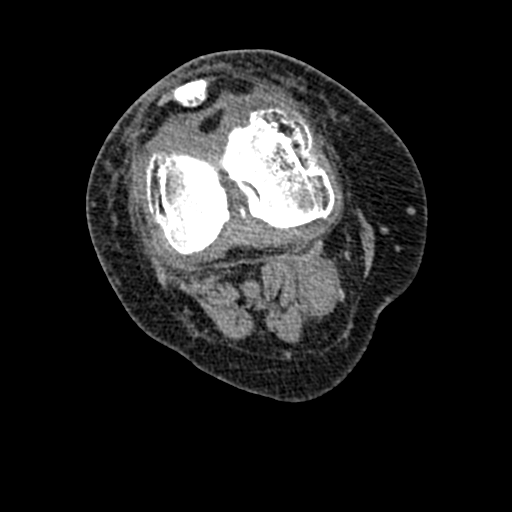
[im 48/90  bone]
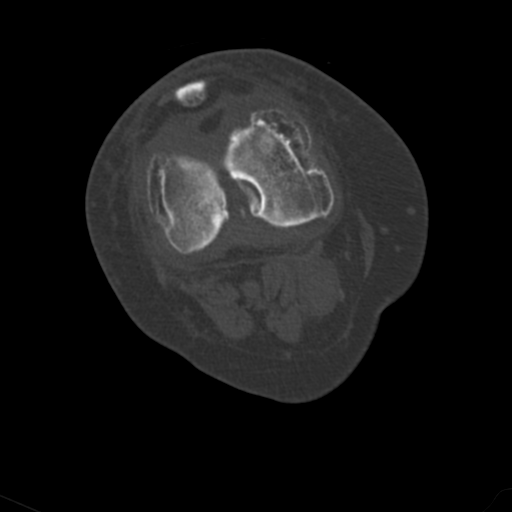
[im 62/90  bone]
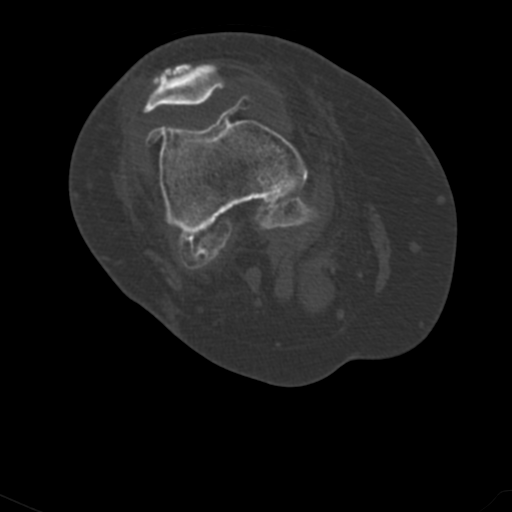
[im 69/90  bone]
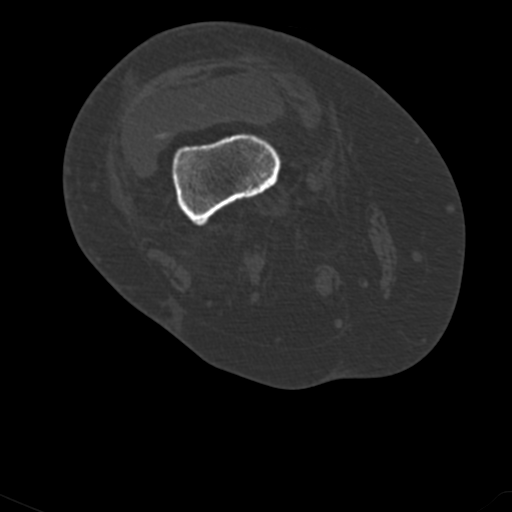
[im 83/90  bone]
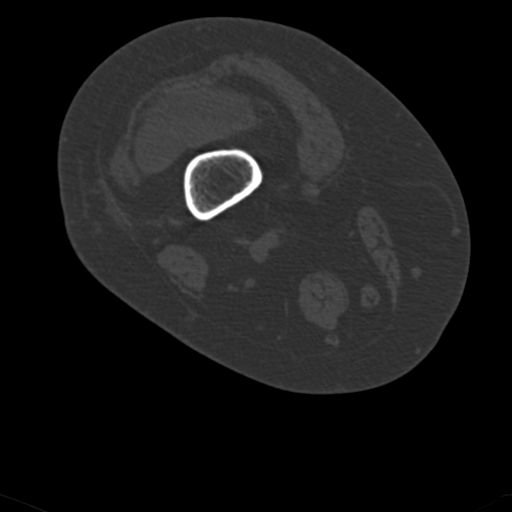

[Series 9: coronal st · coronal · 0.40mm/px · 3 of 119 slices shown]
[im 24/119  bone]
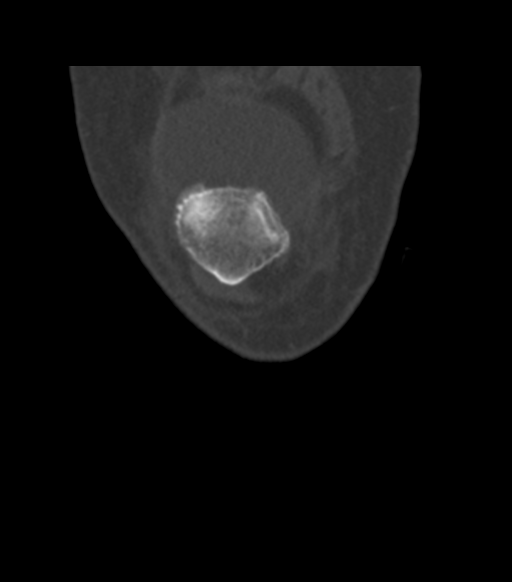
[im 48/119  bone]
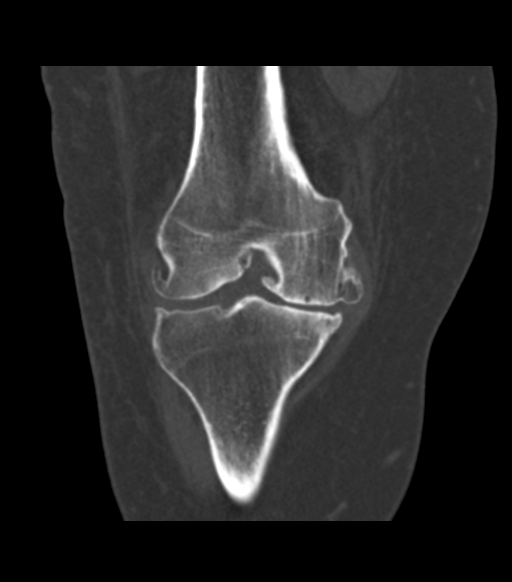
[im 71/119  bone]
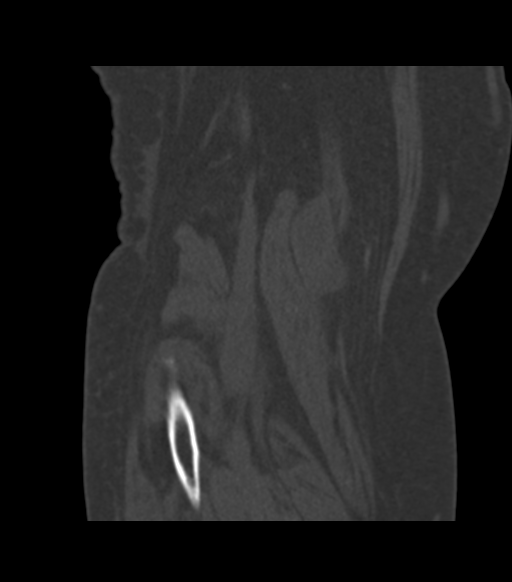

[Series 10: sagittal st · sagittal · 0.46mm/px · 5 of 102 slices shown, 6 images]
[im 34/102  bone]
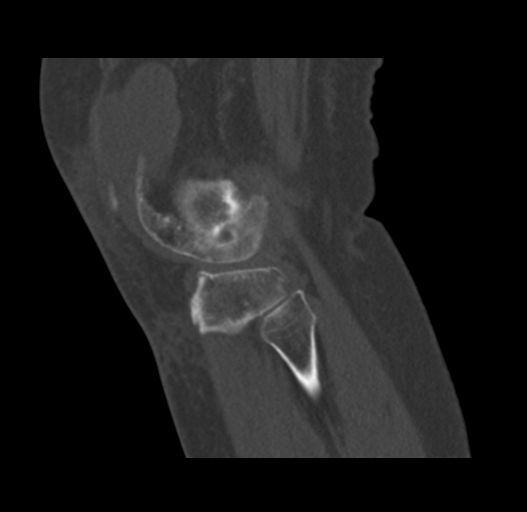
[im 43/102  bone]
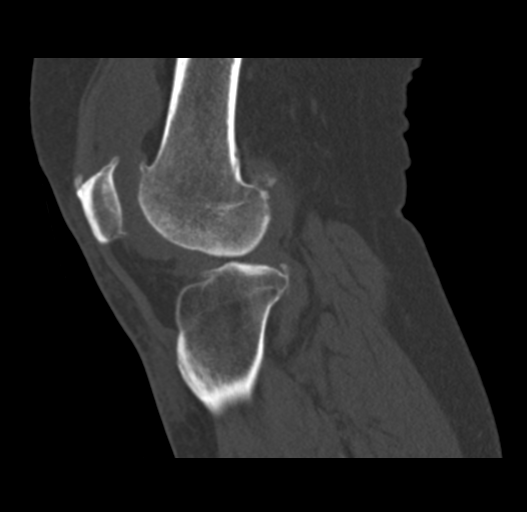
[im 51/102  soft-tissue]
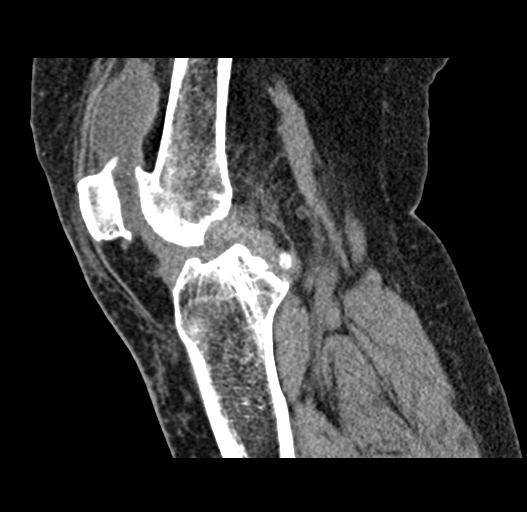
[im 51/102  bone]
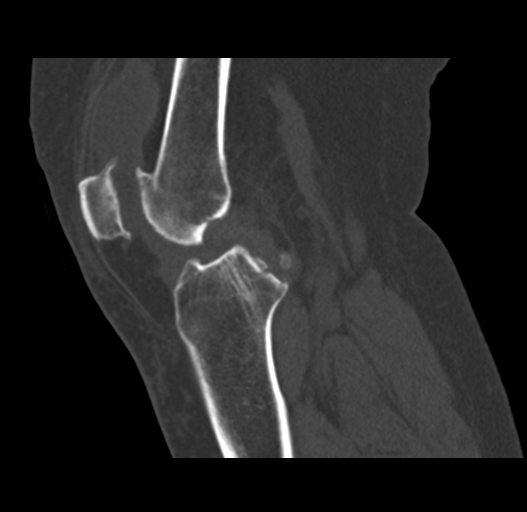
[im 59/102  bone]
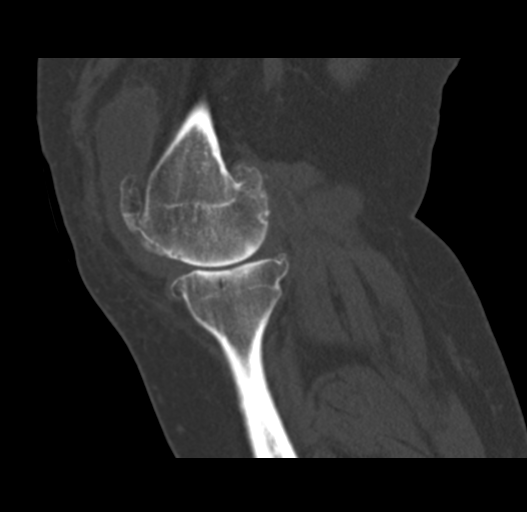
[im 68/102  bone]
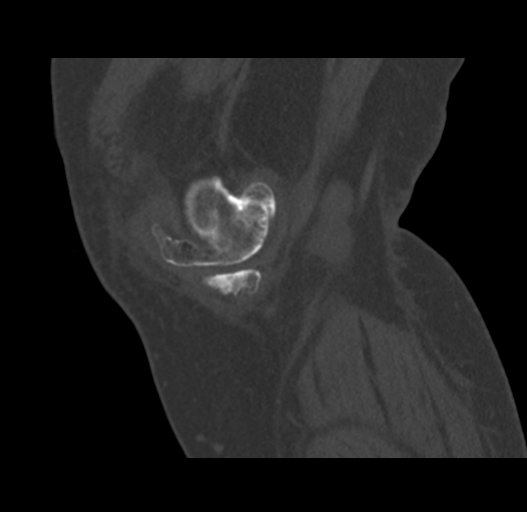

[16 of 33 positions shown; findings below may reference images not displayed]

FINDINGS: Bones/Joint/Cartilage

Tricompartmental degenerative changes are noted. No acute fracture
or dislocation is seen.

Ligaments

Suboptimally assessed by CT. Posterior cruciate ligament is intact.
The anterior cruciate ligament is not discretely visualized.
Collateral ligaments appear intact.

Muscles and Tendons

Surrounding musculature is within normal limits.

Soft tissues

Prepatellar soft tissue swelling is noted as well as a large joint
effusion.
IMPRESSION: No acute fracture is noted.

Tricompartmental degenerative change is seen.

Large joint effusion related to the recent injury. This somewhat
limits evaluation of the underlying ligaments. The anterior cruciate
ligament is not discretely visualized. NONEMERGENT MRI may be
helpful for further evaluation.

## 2022-05-21 ENCOUNTER — Other Ambulatory Visit: Payer: Self-pay | Admitting: Internal Medicine

## 2022-05-21 DIAGNOSIS — I10 Essential (primary) hypertension: Secondary | ICD-10-CM

## 2022-05-21 DIAGNOSIS — K219 Gastro-esophageal reflux disease without esophagitis: Secondary | ICD-10-CM

## 2022-05-26 ENCOUNTER — Other Ambulatory Visit: Payer: Self-pay | Admitting: Internal Medicine

## 2022-05-26 DIAGNOSIS — I10 Essential (primary) hypertension: Secondary | ICD-10-CM

## 2022-06-22 ENCOUNTER — Ambulatory Visit (HOSPITAL_BASED_OUTPATIENT_CLINIC_OR_DEPARTMENT_OTHER): Payer: BLUE CROSS/BLUE SHIELD | Attending: Internal Medicine | Admitting: Internal Medicine

## 2022-06-22 DIAGNOSIS — G4733 Obstructive sleep apnea (adult) (pediatric): Secondary | ICD-10-CM | POA: Insufficient documentation

## 2022-06-22 DIAGNOSIS — R0683 Snoring: Secondary | ICD-10-CM | POA: Diagnosis not present

## 2022-06-27 DIAGNOSIS — R0683 Snoring: Secondary | ICD-10-CM | POA: Diagnosis not present

## 2022-06-27 NOTE — Procedures (Signed)
    Patient Name: Courtney Bates, Courtney Bates Date: 06/22/2022 Gender: Female D.O.B: 05/25/1966 Age (years): 55 Referring Provider: Hillard Danker Height (inches): 66 Interpreting Physician: Jetty Duhamel MD, ABSM Weight (lbs): 299 RPSGT: [place] BMI: 48 MRN: [id number] Neck Size: 15.00  CLINICAL INFORMATION Sleep Study Type: HST Indication for sleep study: OSA Epworth Sleepiness Score: 9  SLEEP STUDY TECHNIQUE A multi-channel overnight portable sleep study was performed. The channels recorded were: nasal airflow, thoracic respiratory movement, and oxygen saturation with a pulse oximetry. Snoring was also monitored.  MEDICATIONS Patient self administered medications include: none reported.  SLEEP ARCHITECTURE Patient was studied for 364.5 minutes. The sleep efficiency was 100.0 % and the patient was supine for 0%. The arousal index was 0.0 per hour.  RESPIRATORY PARAMETERS The overall AHI was 10.7 per hour, with a central apnea index of 0 per hour. The oxygen nadir was 81% during sleep.  CARDIAC DATA Mean heart rate during sleep was 82.1 bpm.  IMPRESSIONS - Mild obstructive sleep apnea occurred during this study (AHI = 10.7/h). - Moderate oxygen desaturation was noted during this study (Min O2 = 81%). Mean 96% - Patient snored.  DIAGNOSIS - Obstructive Sleep Apnea (G47.33)  RECOMMENDATIONS - Suggest CPAP titration sleep study or autopap. Other options would be based on clinical judgment. - Be careful with alcohol, sedatives and other CNS depressants that may worsen sleep apnea and disrupt normal sleep architecture. - Sleep hygiene should be reviewed to assess factors that may improve sleep quality. - Weight management and regular exercise should be initiated or continued.  [Electronically signed] 06/27/2022 11:01 AM  Jetty Duhamel MD, ABSM Diplomate, American Board of Sleep Medicine NPI: [id number]                         Jetty Duhamel Diplomate, American Board of Sleep Medicine  ELECTRONICALLY SIGNED ON:  06/27/2022, 10:58 AM [facility] PH: [phone]   FX: [phone] ACCREDITED BY THE AMERICAN ACADEMY OF SLEEP MEDICINE

## 2022-09-06 ENCOUNTER — Ambulatory Visit: Payer: BLUE CROSS/BLUE SHIELD | Admitting: Internal Medicine

## 2022-09-06 ENCOUNTER — Encounter: Payer: Self-pay | Admitting: Internal Medicine

## 2022-09-06 VITALS — BP 158/90 | HR 86 | Ht 66.0 in | Wt 305.0 lb

## 2022-09-06 DIAGNOSIS — I10 Essential (primary) hypertension: Secondary | ICD-10-CM | POA: Diagnosis not present

## 2022-09-06 DIAGNOSIS — G479 Sleep disorder, unspecified: Secondary | ICD-10-CM | POA: Diagnosis not present

## 2022-09-06 DIAGNOSIS — G4733 Obstructive sleep apnea (adult) (pediatric): Secondary | ICD-10-CM | POA: Diagnosis not present

## 2022-09-06 DIAGNOSIS — G43009 Migraine without aura, not intractable, without status migrainosus: Secondary | ICD-10-CM | POA: Diagnosis not present

## 2022-09-06 MED ORDER — KETOROLAC TROMETHAMINE 30 MG/ML IJ SOLN
30.0000 mg | Freq: Once | INTRAMUSCULAR | Status: AC
  Administered 2022-09-06: 30 mg via INTRAMUSCULAR

## 2022-09-06 MED ORDER — METHYLPREDNISOLONE ACETATE 40 MG/ML IJ SUSP
40.0000 mg | Freq: Once | INTRAMUSCULAR | Status: AC
  Administered 2022-09-06: 40 mg via INTRAMUSCULAR

## 2022-09-06 MED ORDER — TRAZODONE HCL 50 MG PO TABS: 50.0000 mg | ORAL_TABLET | Freq: Every day | ORAL | 1 refills | Status: DC

## 2022-09-06 NOTE — Progress Notes (Unsigned)
   Subjective:   Patient ID: Courtney Bates, female    DOB: 05-31-1966, 56 y.o.   MRN: [id number]  Migraine  Pertinent negatives include no abdominal pain, coughing, nausea or vomiting.   The patient is a 56 YO female coming in for headaches, loss of 4 friend/family recently, sleep problems.   Review of Systems  Constitutional: Negative.   HENT: Negative.    Eyes: Negative.   Respiratory:  Negative for cough, chest tightness and shortness of breath.   Cardiovascular:  Negative for chest pain, palpitations and leg swelling.  Gastrointestinal:  Negative for abdominal distention, abdominal pain, constipation, diarrhea, nausea and vomiting.  Musculoskeletal: Negative.   Skin: Negative.   Neurological:  Positive for headaches.  Psychiatric/Behavioral:  Positive for sleep disturbance.     Objective:  Physical Exam Constitutional:      Appearance: She is well-developed. She is obese.  HENT:     Head: Normocephalic and atraumatic.  Cardiovascular:     Rate and Rhythm: Normal rate and regular rhythm.  Pulmonary:     Effort: Pulmonary effort is normal. No respiratory distress.     Breath sounds: Normal breath sounds. No wheezing or rales.  Abdominal:     General: Bowel sounds are normal. There is no distension.     Palpations: Abdomen is soft.     Tenderness: There is no abdominal tenderness. There is no rebound.  Musculoskeletal:     Cervical back: Normal range of motion.  Skin:    General: Skin is warm and dry.  Neurological:     Mental Status: She is alert and oriented to person, place, and time.     Coordination: Coordination normal.     Vitals:   09/06/22 1457 09/06/22 1501  BP: (!) 168/98 (!) 158/90  Pulse: 86   SpO2: 97%   Weight: (!) 305 lb (138.3 kg)   Height: 5\' 6"  (1.676 m)     Assessment & Plan:  Depo-medrol 40 mg IM and toradol 30 mg IM given at visit

## 2022-09-06 NOTE — Assessment & Plan Note (Signed)
Rx trazodone 50 mg qhs to try for sleep. Suspect lack of sleep is contributing to worsening migraines lately.

## 2022-09-06 NOTE — Assessment & Plan Note (Signed)
Migraines are recurrent and last 3 weeks present. Suspect lack of sleep, more stress is triggering. Loss of 4 family/friend recently. Given toradol 30 mg IM and depo-medrol 40 mg IM today to help break headache cycle. Treating sleep with trazodone.

## 2022-09-06 NOTE — Assessment & Plan Note (Signed)
Mild and we will see if morning headaches related by trying other treatment. If not successful we will order CPAP.

## 2022-09-06 NOTE — Patient Instructions (Signed)
We have sent in the trazodone to take as needed at night time for sleep.  We have given you a shot today to help with the headaches.

## 2022-09-08 NOTE — Assessment & Plan Note (Signed)
BP is moderately elevated today in setting of severe migraine. She has been monitoring at home without elevation. She will continue amlodipine 10 mg daily and losartan/hctz 100/25 mg daily and metoprolol 25 mg daily. If elevated at home she will notify us and we can add agent.

## 2022-10-25 ENCOUNTER — Ambulatory Visit: Payer: BLUE CROSS/BLUE SHIELD | Admitting: Internal Medicine

## 2022-10-25 ENCOUNTER — Encounter: Payer: Self-pay | Admitting: Internal Medicine

## 2022-10-25 VITALS — BP 140/100 | HR 81 | Temp 98.1°F | Ht 66.0 in | Wt 309.2 lb

## 2022-10-25 DIAGNOSIS — I10 Essential (primary) hypertension: Secondary | ICD-10-CM

## 2022-10-25 DIAGNOSIS — N3281 Overactive bladder: Secondary | ICD-10-CM

## 2022-10-25 DIAGNOSIS — G43009 Migraine without aura, not intractable, without status migrainosus: Secondary | ICD-10-CM | POA: Diagnosis not present

## 2022-10-25 MED ORDER — OXYBUTYNIN CHLORIDE ER 5 MG PO TB24: 5.0000 mg | ORAL_TABLET | Freq: Every day | ORAL | 3 refills | Status: DC

## 2022-10-25 MED ORDER — UBRELVY 50 MG PO TABS: 50.0000 mg | ORAL_TABLET | Freq: Every day | ORAL | 6 refills | Status: DC | PRN

## 2022-10-25 NOTE — Patient Instructions (Addendum)
We have sent in oxybutynin to take in the evening to help the bladder so you are not having problems.  We will get the MRI of the brain

## 2022-10-25 NOTE — Assessment & Plan Note (Signed)
Suspected diagnosis as this is more situational and night time. Rx oxybutynin 5 mg qhs to see if this helps. She is on losartan/hctz and discussed that this can contribute to urinary urgency. Encouraged to lower caffeine (1 cup per day currently) and use the restroom often to help avoid urgency.

## 2022-10-25 NOTE — Assessment & Plan Note (Signed)
Migraine pattern has changed in the past 2-3 months. She is now having 2-3 severe migraines per month. Previously it was years since her last migraine. Ordered MRI brain to assess. Rx ubrelvy 50 mg to use for migraines prn.

## 2022-10-25 NOTE — Progress Notes (Unsigned)
   Subjective:   Patient ID: Courtney Bates, female    DOB: 06/17/1966, 56 y.o.   MRN: [id number]  HPI The patient is a 56 YO female coming in for urinary issues and ongoing migraines.   Review of Systems  Constitutional: Negative.   HENT: Negative.    Eyes: Negative.   Respiratory:  Negative for cough, chest tightness and shortness of breath.   Cardiovascular:  Negative for chest pain, palpitations and leg swelling.  Gastrointestinal:  Negative for abdominal distention, abdominal pain, constipation, diarrhea, nausea and vomiting.  Genitourinary:  Positive for frequency and urgency.  Musculoskeletal: Negative.   Skin: Negative.   Neurological:  Positive for headaches.  Psychiatric/Behavioral: Negative.      Objective:  Physical Exam Constitutional:      Appearance: She is well-developed. She is obese.  HENT:     Head: Normocephalic and atraumatic.  Cardiovascular:     Rate and Rhythm: Normal rate and regular rhythm.  Pulmonary:     Effort: Pulmonary effort is normal. No respiratory distress.     Breath sounds: Normal breath sounds. No wheezing or rales.  Abdominal:     General: Bowel sounds are normal. There is no distension.     Palpations: Abdomen is soft.     Tenderness: There is no abdominal tenderness. There is no rebound.  Musculoskeletal:     Cervical back: Normal range of motion.  Skin:    General: Skin is warm and dry.  Neurological:     Mental Status: She is alert and oriented to person, place, and time.     Coordination: Coordination normal.     Vitals:   10/25/22 1422 10/25/22 1427  BP: (!) 140/100 (!) 140/100  Pulse: 81   Temp: 98.1 F (36.7 C)   TempSrc: Oral   SpO2: 96%   Weight: (!) 309 lb 4 oz (140.3 kg)   Height: 5\' 6"  (1.676 m)     Assessment & Plan:

## 2022-10-26 NOTE — Assessment & Plan Note (Signed)
BP moderately elevated due to migraine today. She is checking BP and this is not elevated normally. Continue close monitoring and continue metoprolol 25 mg daily and losartan/hctz 100/25 mg daily for now. Close follow up.

## 2022-11-02 ENCOUNTER — Other Ambulatory Visit: Payer: Self-pay

## 2022-11-02 DIAGNOSIS — N951 Menopausal and female climacteric states: Secondary | ICD-10-CM

## 2022-11-03 ENCOUNTER — Ambulatory Visit
Admission: RE | Admit: 2022-11-03 | Discharge: 2022-11-03 | Disposition: A | Discharge status: Home / Self Care | Payer: BLUE CROSS/BLUE SHIELD | Source: Ambulatory Visit | Attending: Internal Medicine | Admitting: Internal Medicine

## 2022-11-03 DIAGNOSIS — G43009 Migraine without aura, not intractable, without status migrainosus: Secondary | ICD-10-CM

## 2022-11-03 DIAGNOSIS — R519 Headache, unspecified: Secondary | ICD-10-CM | POA: Diagnosis not present

## 2022-11-18 DIAGNOSIS — M170 Bilateral primary osteoarthritis of knee: Secondary | ICD-10-CM | POA: Diagnosis not present

## 2022-11-22 DIAGNOSIS — Z1231 Encounter for screening mammogram for malignant neoplasm of breast: Secondary | ICD-10-CM | POA: Diagnosis not present

## 2022-11-22 DIAGNOSIS — R92323 Mammographic fibroglandular density, bilateral breasts: Secondary | ICD-10-CM | POA: Diagnosis not present

## 2022-11-22 LAB — HM MAMMOGRAPHY

## 2022-11-23 ENCOUNTER — Ambulatory Visit: Payer: BLUE CROSS/BLUE SHIELD | Admitting: Nurse Practitioner

## 2022-11-24 ENCOUNTER — Ambulatory Visit (INDEPENDENT_AMBULATORY_CARE_PROVIDER_SITE_OTHER): Payer: BLUE CROSS/BLUE SHIELD | Admitting: Nurse Practitioner

## 2022-11-24 ENCOUNTER — Encounter: Payer: Self-pay | Admitting: Nurse Practitioner

## 2022-11-24 VITALS — BP 152/98 | HR 84 | Ht 66.5 in | Wt 307.0 lb

## 2022-11-24 DIAGNOSIS — Z01419 Encounter for gynecological examination (general) (routine) without abnormal findings: Secondary | ICD-10-CM | POA: Diagnosis not present

## 2022-11-24 DIAGNOSIS — Z780 Asymptomatic menopausal state: Secondary | ICD-10-CM

## 2022-11-24 DIAGNOSIS — F331 Major depressive disorder, recurrent, moderate: Secondary | ICD-10-CM

## 2022-11-24 MED ORDER — ESCITALOPRAM OXALATE 5 MG PO TABS: 5.0000 mg | ORAL_TABLET | Freq: Every day | ORAL | 2 refills | Status: DC

## 2022-11-24 NOTE — Progress Notes (Signed)
   Courtney Bates 09/19/1966 [id number]   History:  56 y.o. G1P0012 presents for annual exam. Postmenopausal - no HRT, no bleeding. Has been struggling with depression due to multiple personal losses. Normal pap and mammogram history. HTN managed by PCP.   Gynecologic History No LMP recorded. Patient is postmenopausal. Contraception: post menopausal status   Sexually active: No  Health maintenance Last Pap: 11/14/2019. Results were: Normal Last mammogram: 11/22/2022. Results were: Normal Last colonoscopy: 02/07/2017. Results were: Normal, 10-year recall Last Dexa: Never  Past medical history, past surgical history, family history and social history were all reviewed and documented in the EPIC chart. Single. 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 Son has  grandchildren ages 1.5 and 17. 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   ROS:  A ROS was performed and pertinent positives and negatives are included.  Exam:  Vitals:   11/24/22 1521  BP: (!) 152/98  Pulse: 84  SpO2: 98%  Weight: (!) 307 lb (139.3 kg)  Height: 5' 6.5" (1.689 m)     Body mass index is 48.81 kg/m.  General appearance:  Normal Thyroid:  Symmetrical, normal in size, without palpable masses or nodularity. Respiratory  Auscultation:  Clear without wheezing or rhonchi Cardiovascular  Auscultation:  Regular rate, without rubs, murmurs or gallops  Edema/varicosities:  Not grossly evident Abdominal  Soft,nontender, without masses, guarding or rebound.  Liver/spleen:  No organomegaly noted  Hernia:  None appreciated  Skin  Inspection:  Grossly normal Breasts: Examined lying and sitting.   Right: Without masses, retractions, nipple discharge or axillary adenopathy.   Left: Without masses, retractions, nipple discharge or axillary adenopathy. Genitourinary   Inguinal/mons:  Normal without inguinal adenopathy  External genitalia:  Normal appearing vulva with no masses,  tenderness, or lesions  BUS/Urethra/Skene's glands:  Normal  Vagina:  Normal appearing with normal color and discharge, no lesions  Cervix:  Normal appearing without discharge or lesions  Uterus:  Difficult to palpate due to body habitus but no gross masses or tenderness  Adnexa/parametria:     Rt: Normal in size, without masses or tenderness.   Lt: Normal in size, without masses or tenderness.  Anus and perineum: Normal  Digital rectal exam: Normal sphincter tone without palpated masses or tenderness  Patient informed chaperone available to be present for breast and pelvic exam. Patient has requested no chaperone to be present. Patient has been advised what will be completed during breast and pelvic exam.   Assessment/Plan:  56 y.o. [id number] for annual exam.   Well female exam with routine gynecological exam - Education provided on SBEs, importance of preventative screenings, current guidelines, high calcium diet, regular exercise, and multivitamin daily. Labs with PCP.   Postmenopausal - no HRT, no bleeding  Moderate episode of recurrent major depressive disorder (HCC) - Plan: escitalopram (LEXAPRO) 5 MG tablet daily. Education on initial side effects, proper use and recommended time of use.   Screening for cervical cancer - Normal Pap history.  Will repeat at 5-year interval per guidelines.  Screening for breast cancer - Normal mammogram history.  Continue annual screenings.  Normal breast exam today.   Screening for colon cancer - Normal screening colonoscopy in 2018.  She will repeat at 10-year interval per GI recommendation.   Follow-up in 1 year for annual.       Olivia Mackie [facility], 3:45 PM 11/24/2022

## 2022-12-25 ENCOUNTER — Other Ambulatory Visit: Payer: Self-pay | Admitting: Nurse Practitioner

## 2022-12-25 DIAGNOSIS — F331 Major depressive disorder, recurrent, moderate: Secondary | ICD-10-CM

## 2022-12-28 NOTE — Telephone Encounter (Signed)
Med refill request:escitalopram 5mg  tab PO daily  Last AEX: 11/24/22/ TW Next AEX: Not scheduled  Last MMG (if hormonal med) N/A  Tx sent 11/24/22, #30/ 2RF.  Pharmacy requesting 90 day supply. Rx pended #90/1 RF   Refill authorized: Please Advise?

## 2023-01-17 ENCOUNTER — Other Ambulatory Visit: Payer: Self-pay | Admitting: Internal Medicine

## 2023-02-10 ENCOUNTER — Other Ambulatory Visit (HOSPITAL_COMMUNITY): Payer: Self-pay

## 2023-02-11 ENCOUNTER — Telehealth: Payer: Self-pay

## 2023-02-11 ENCOUNTER — Other Ambulatory Visit (HOSPITAL_COMMUNITY): Payer: Self-pay

## 2023-02-11 NOTE — Telephone Encounter (Signed)
Pharmacy Patient Advocate Encounter   Received notification that prior authorization for Ubrelvy '50mg'$  is required/requested.  Per Test Claim: Prior authorization required.  Quantity limit may apply - Max 16 in 27 days.    PA submitted on 02/11/23 to (ins) [employer] via [employer]  # [id number] Status is pending

## 2023-02-21 ENCOUNTER — Other Ambulatory Visit (HOSPITAL_COMMUNITY): Payer: Self-pay

## 2023-02-21 DIAGNOSIS — M1711 Unilateral primary osteoarthritis, right knee: Secondary | ICD-10-CM | POA: Diagnosis not present

## 2023-03-10 ENCOUNTER — Other Ambulatory Visit (HOSPITAL_COMMUNITY): Payer: Self-pay

## 2023-03-10 NOTE — Telephone Encounter (Signed)
Received a fax regarding Prior Authorization from [employer] for [place] 50mg  tabs.  Authorization has been DENIED because the request does not meet the definition of medically necessity found in the member's benefit booklet. The medication is approved when the member has had relief from acute migraines with the use of the medication, it is not taken with Nurtec ODT or Zavzpret to treat acute migraines at the same time.  Phone# [phone] ext. [phone]  Denial letter attached to chart  Please be advised we currently do not have a pharmacist to review denials, therefore you will need to process appeals accordingly as needed. Thanks for your support at this time.

## 2023-04-29 ENCOUNTER — Encounter: Payer: Self-pay | Admitting: Internal Medicine

## 2023-04-29 ENCOUNTER — Ambulatory Visit: Payer: BLUE CROSS/BLUE SHIELD | Admitting: Internal Medicine

## 2023-04-29 VITALS — BP 124/80 | HR 79 | Temp 98.6°F | Ht 66.5 in | Wt 315.0 lb

## 2023-04-29 DIAGNOSIS — I10 Essential (primary) hypertension: Secondary | ICD-10-CM | POA: Diagnosis not present

## 2023-04-29 DIAGNOSIS — K219 Gastro-esophageal reflux disease without esophagitis: Secondary | ICD-10-CM | POA: Diagnosis not present

## 2023-04-29 DIAGNOSIS — Z0000 Encounter for general adult medical examination without abnormal findings: Secondary | ICD-10-CM | POA: Diagnosis not present

## 2023-04-29 DIAGNOSIS — N3281 Overactive bladder: Secondary | ICD-10-CM

## 2023-04-29 DIAGNOSIS — M170 Bilateral primary osteoarthritis of knee: Secondary | ICD-10-CM

## 2023-04-29 DIAGNOSIS — E538 Deficiency of other specified B group vitamins: Secondary | ICD-10-CM | POA: Diagnosis not present

## 2023-04-29 LAB — COMPREHENSIVE METABOLIC PANEL
ALT: 9 U/L (ref 0–35)
AST: 15 U/L (ref 0–37)
Albumin: 4 g/dL (ref 3.5–5.2)
Alkaline Phosphatase: 88 U/L (ref 39–117)
BUN: 15 mg/dL (ref 6–23)
CO2: 31 mEq/L (ref 19–32)
Calcium: 9.1 mg/dL (ref 8.4–10.5)
Chloride: 102 mEq/L (ref 96–112)
Creatinine, Ser: 1.04 mg/dL (ref 0.40–1.20)
GFR: 60.13 mL/min (ref >60.00)
Glucose, Bld: 87 mg/dL (ref 70–99)
Potassium: 4 mEq/L (ref 3.5–5.1)
Sodium: 141 mEq/L (ref 135–145)
Total Bilirubin: 0.4 mg/dL (ref 0.2–1.2)
Total Protein: 6.6 g/dL (ref 6.0–8.3)

## 2023-04-29 LAB — CBC
HCT: 40.4 % (ref 36.0–46.0)
Hemoglobin: 13.6 g/dL (ref 12.0–15.0)
MCHC: 33.7 g/dL (ref 30.0–36.0)
MCV: 80.2 fl (ref 78.0–100.0)
Platelets: 341 10*3/uL (ref 150.0–400.0)
RBC: 5.04 Mil/uL (ref 3.87–5.11)
RDW: 13.5 % (ref 11.5–15.5)
WBC: 10.4 10*3/uL (ref 4.0–10.5)

## 2023-04-29 LAB — VITAMIN D 25 HYDROXY (VIT D DEFICIENCY, FRACTURES): VITD: 10.62 ng/mL — ABNORMAL LOW (ref 30.00–100.00)

## 2023-04-29 LAB — LIPID PANEL
Cholesterol: 171 mg/dL (ref 0–200)
HDL: 34.5 mg/dL — ABNORMAL LOW (ref >39.00)
LDL Cholesterol: 97 mg/dL (ref 0–99)
NonHDL: 136.2
Total CHOL/HDL Ratio: 5
Triglycerides: 197 mg/dL — ABNORMAL HIGH (ref 0.0–149.0)
VLDL: 39.4 mg/dL (ref 0.0–40.0)

## 2023-04-29 LAB — TSH: TSH: 1.7 u[IU]/mL (ref 0.35–5.50)

## 2023-04-29 LAB — VITAMIN B12: Vitamin B-12: 367 pg/mL (ref 211–911)

## 2023-04-29 LAB — HEMOGLOBIN A1C: Hgb A1c MFr Bld: 4.9 % (ref 4.6–6.5)

## 2023-04-29 MED ORDER — NEOMYCIN-POLYMYXIN-HC 1 % OT SOLN: 3.0000 [drp] | Freq: Three times a day (TID) | OTIC | 0 refills | Status: DC

## 2023-04-29 MED ORDER — METOPROLOL SUCCINATE ER 25 MG PO TB24: 25.0000 mg | ORAL_TABLET | Freq: Every day | ORAL | 3 refills | Status: DC

## 2023-04-29 MED ORDER — UBRELVY 50 MG PO TABS: 50.0000 mg | ORAL_TABLET | Freq: Every day | ORAL | 6 refills | Status: DC | PRN

## 2023-04-29 MED ORDER — LOSARTAN POTASSIUM-HCTZ 100-25 MG PO TABS: 1.0000 | ORAL_TABLET | Freq: Every day | ORAL | 3 refills | Status: DC

## 2023-04-29 MED ORDER — TRAZODONE HCL 50 MG PO TABS
50.0000 mg | ORAL_TABLET | Freq: Every day | ORAL | 3 refills | Status: DC
Start: 2023-04-29 — End: 2024-06-13

## 2023-04-29 MED ORDER — OXYBUTYNIN CHLORIDE ER 5 MG PO TB24: ORAL_TABLET | ORAL | 3 refills | Status: DC

## 2023-04-29 MED ORDER — TOPIRAMATE 50 MG PO TABS
50.0000 mg | ORAL_TABLET | Freq: Every day | ORAL | 3 refills | Status: DC
Start: 2023-04-29 — End: 2023-09-20

## 2023-04-29 MED ORDER — PANTOPRAZOLE SODIUM 40 MG PO TBEC: 40.0000 mg | DELAYED_RELEASE_TABLET | Freq: Every day | ORAL | 3 refills | Status: DC

## 2023-04-29 MED ORDER — AMLODIPINE BESYLATE 10 MG PO TABS: 10.0000 mg | ORAL_TABLET | Freq: Every day | ORAL | 3 refills | Status: DC

## 2023-04-29 MED ORDER — MELOXICAM 15 MG PO TABS: 15.0000 mg | ORAL_TABLET | Freq: Every day | ORAL | 3 refills | Status: DC

## 2023-04-29 MED ORDER — OXYBUTYNIN CHLORIDE ER 10 MG PO TB24: 10.0000 mg | ORAL_TABLET | Freq: Every day | ORAL | 3 refills | Status: DC

## 2023-04-29 NOTE — Assessment & Plan Note (Signed)
Worsening symptoms increase oxybutynin to 10 mg xl daily.

## 2023-04-29 NOTE — Assessment & Plan Note (Signed)
BP at goal on metoprolol 25 mg daily and amlodipine 10 mg daily and losartan/hctz 100/25 mg daily. Checking CMP and adjust as needed.

## 2023-04-29 NOTE — Assessment & Plan Note (Signed)
Taking protonix 40 mg daily and controlled. Will continue.

## 2023-04-29 NOTE — Patient Instructions (Signed)
We have increased the dose of the bladder medicine oxybutynin to help more.  We have sent in topiramate to take daily in the evening to help reduce migraines and appetite.

## 2023-04-29 NOTE — Assessment & Plan Note (Signed)
Checking B12 level today. Adjust as needed

## 2023-04-29 NOTE — Assessment & Plan Note (Signed)
Weight is increasing. We will add topiramate 50 mg qhs and titrate as needed to help. Counseled about diet and exercise.

## 2023-04-29 NOTE — Progress Notes (Signed)
   Subjective:   Patient ID: Courtney Bates, female    DOB: [birth year], 57 y.o.   MRN: [id number]  HPI The patient is here for physical.  PMH, [facility], social history reviewed and updated  Review of Systems  Constitutional: Negative.   HENT: Negative.    Eyes: Negative.   Respiratory:  Negative for cough, chest tightness and shortness of breath.   Cardiovascular:  Negative for chest pain, palpitations and leg swelling.  Gastrointestinal:  Negative for abdominal distention, abdominal pain, constipation, diarrhea, nausea and vomiting.  Musculoskeletal: Negative.   Skin: Negative.   Neurological: Negative.   Psychiatric/Behavioral: Negative.      Objective:  Physical Exam Constitutional:      Appearance: She is well-developed. She is obese.  HENT:     Head: Normocephalic and atraumatic.  Cardiovascular:     Rate and Rhythm: Normal rate and regular rhythm.  Pulmonary:     Effort: Pulmonary effort is normal. No respiratory distress.     Breath sounds: Normal breath sounds. No wheezing or rales.  Abdominal:     General: Bowel sounds are normal. There is no distension.     Palpations: Abdomen is soft.     Tenderness: There is no abdominal tenderness. There is no rebound.  Musculoskeletal:     Cervical back: Normal range of motion.  Skin:    General: Skin is warm and dry.  Neurological:     Mental Status: She is alert and oriented to person, place, and time.     Coordination: Coordination normal.     Vitals:   04/29/23 1436  BP: 124/80  Pulse: 79  Temp: 98.6 F (37 C)  TempSrc: Oral  SpO2: 97%  Weight: (!) 315 lb (142.9 kg)  Height: 5' 6.5" (1.689 m)    Assessment & Plan:

## 2023-04-29 NOTE — Assessment & Plan Note (Signed)
Flu shot yearly. Shingrix had 1st declines 2nd. Tetanus up to date. Colonoscopy up to date. Mammogram up to date, pap smear up to date. Counseled about sun safety and mole surveillance. Counseled about the dangers of distracted driving. Given 10 year screening recommendations.

## 2023-04-29 NOTE — Assessment & Plan Note (Signed)
Overall worsening she is trying to lose weight to qualify for knee surgery.

## 2023-05-02 ENCOUNTER — Other Ambulatory Visit: Payer: Self-pay | Admitting: Internal Medicine

## 2023-05-02 MED ORDER — VITAMIN D (ERGOCALCIFEROL) 1.25 MG (50000 UNIT) PO CAPS: 50000.0000 [IU] | ORAL_CAPSULE | ORAL | 0 refills | Status: DC

## 2023-05-10 ENCOUNTER — Other Ambulatory Visit (HOSPITAL_COMMUNITY): Payer: Self-pay

## 2023-05-13 DIAGNOSIS — M79661 Pain in right lower leg: Secondary | ICD-10-CM | POA: Diagnosis not present

## 2023-05-13 DIAGNOSIS — M79605 Pain in left leg: Secondary | ICD-10-CM | POA: Diagnosis not present

## 2023-05-13 DIAGNOSIS — M79604 Pain in right leg: Secondary | ICD-10-CM | POA: Diagnosis not present

## 2023-05-13 DIAGNOSIS — M25561 Pain in right knee: Secondary | ICD-10-CM | POA: Diagnosis not present

## 2023-05-14 ENCOUNTER — Encounter (HOSPITAL_BASED_OUTPATIENT_CLINIC_OR_DEPARTMENT_OTHER): Payer: Self-pay | Admitting: Emergency Medicine

## 2023-05-14 ENCOUNTER — Emergency Department (HOSPITAL_BASED_OUTPATIENT_CLINIC_OR_DEPARTMENT_OTHER): Payer: BLUE CROSS/BLUE SHIELD

## 2023-05-14 ENCOUNTER — Other Ambulatory Visit (HOSPITAL_BASED_OUTPATIENT_CLINIC_OR_DEPARTMENT_OTHER): Payer: Self-pay

## 2023-05-14 ENCOUNTER — Other Ambulatory Visit: Payer: Self-pay

## 2023-05-14 ENCOUNTER — Emergency Department (HOSPITAL_BASED_OUTPATIENT_CLINIC_OR_DEPARTMENT_OTHER)
Admission: EM | Admit: 2023-05-14 | Discharge: 2023-05-14 | Disposition: A | Discharge status: Home / Self Care | Payer: BLUE CROSS/BLUE SHIELD | Attending: Emergency Medicine | Admitting: Emergency Medicine

## 2023-05-14 DIAGNOSIS — M79661 Pain in right lower leg: Secondary | ICD-10-CM | POA: Diagnosis not present

## 2023-05-14 DIAGNOSIS — M79604 Pain in right leg: Secondary | ICD-10-CM | POA: Insufficient documentation

## 2023-05-14 DIAGNOSIS — I10 Essential (primary) hypertension: Secondary | ICD-10-CM | POA: Insufficient documentation

## 2023-05-14 DIAGNOSIS — Z79899 Other long term (current) drug therapy: Secondary | ICD-10-CM | POA: Diagnosis not present

## 2023-05-14 DIAGNOSIS — M25561 Pain in right knee: Secondary | ICD-10-CM | POA: Diagnosis not present

## 2023-05-14 MED ORDER — KETOROLAC TROMETHAMINE 30 MG/ML IJ SOLN
30.0000 mg | Freq: Once | INTRAMUSCULAR | Status: AC
  Administered 2023-05-14: 30 mg via INTRAMUSCULAR
  Filled 2023-05-14: qty 1

## 2023-05-14 NOTE — Discharge Instructions (Addendum)
As discussed, ultrasound was negative for blood clot in the leg.  Recommend taking prednisone taper prescribed by orthopedics as this should help with your right knee pain/swelling.  Recommend follow-up with orthopedic for reassessment of your symptoms.  Please do not hesitate to return to emergency department if the worrisome signs and symptoms we discussed become apparent.

## 2023-05-14 NOTE — ED Triage Notes (Addendum)
Pt arrives to ED with c/o bilateral posterior knee pain and leg for several days. Was seen by [facility] and sent to ED for rule out bakers cyst/DVT in the right.

## 2023-05-14 NOTE — ED Notes (Signed)
Pt resting in bed; provided pain medication; reiterated POC; call light in reach.

## 2023-05-14 NOTE — ED Provider Notes (Signed)
[place] EMERGENCY DEPARTMENT AT [facility] Provider Note   CSN: [id number] Arrival date & time: 05/14/23  1200     History  Chief Complaint  Patient presents with   Knee Pain   Leg Pain    Courtney Bates is a 57 y.o. female.   Knee Pain Leg Pain   57 year old female presents emergency department with complaints of swelling behind right knee as well as her right knee pain and right calf pain.  Patient states that she has known arthritis in her right knee which intermittently "flares up."  States that over the past 2 days has had some tightness in her right calf as well as swelling in bilateral lower extremities.  Was seen by orthopedics yesterday with x-ray obtained which showed no acute bony abnormality but with some joint swelling.  She was discharged with prednisone at that time.  Given calf tenderness as well as some intermittent lower extremity swelling, orthopedic recommended evaluation in the emergency department for DVT rule out.  Denies any history of DVT/PE, recent surgery/immobilization/travel, known coagulopathy/malignancy, hormonal therapy.  Denies any trauma to affected leg.  Past medical history significant for GERD, hypertension, obesity, depression, OSA  Home Medications Prior to Admission medications   Medication Sig Start Date End Date Taking? Authorizing Provider  Vitamin D, Ergocalciferol, (DRISDOL) 1.25 MG (50000 UNIT) CAPS capsule Take 1 capsule (50,000 Units total) by mouth every 7 (seven) days. 05/02/23   Myrlene Broker, MD  amLODipine (NORVASC) 10 MG tablet Take 1 tablet (10 mg total) by mouth daily. 04/29/23   Myrlene Broker, MD  escitalopram (LEXAPRO) 5 MG tablet TAKE 1 TABLET (5 MG TOTAL) BY MOUTH DAILY. 12/28/22   Olivia Mackie, NP  losartan-hydrochlorothiazide (HYZAAR) 100-25 MG tablet Take 1 tablet by mouth daily. 04/29/23   Myrlene Broker, MD  meloxicam (MOBIC) 15 MG tablet Take 1 tablet (15 mg total) by mouth daily.  04/29/23   Myrlene Broker, MD  metoprolol succinate (TOPROL-XL) 25 MG 24 hr tablet Take 1 tablet (25 mg total) by mouth daily. 04/29/23   Myrlene Broker, MD  NEOMYCIN-POLYMYXIN-HYDROCORTISONE (CORTISPORIN) 1 % SOLN OTIC solution Place 3 drops into both ears every 8 (eight) hours. 04/29/23   Myrlene Broker, MD  oxybutynin (DITROPAN-XL) 10 MG 24 hr tablet Take 1 tablet (10 mg total) by mouth at bedtime. 04/29/23   Myrlene Broker, MD  pantoprazole (PROTONIX) 40 MG tablet Take 1 tablet (40 mg total) by mouth daily. 04/29/23   Myrlene Broker, MD  topiramate (TOPAMAX) 50 MG tablet Take 1 tablet (50 mg total) by mouth daily. 04/29/23   Myrlene Broker, MD  traZODone (DESYREL) 50 MG tablet Take 1 tablet (50 mg total) by mouth at bedtime. 04/29/23   Myrlene Broker, MD  Ubrogepant (UBRELVY) 50 MG TABS Take 1 tablet (50 mg total) by mouth daily as needed (migraine). 04/29/23   Myrlene Broker, MD      Allergies    Penicillins    Review of Systems   Review of Systems  All other systems reviewed and are negative.   Physical Exam Updated Vital Signs BP (!) 165/101 (BP Location: Right Arm)   Pulse 96   Temp 98.7 F (37.1 C) (Oral)   Resp 17   Ht 5' 6.5" (1.689 m)   Wt (!) 142.9 kg   LMP 09/27/2017   SpO2 98%   BMI 50.08 kg/m  Physical Exam Vitals and nursing note reviewed.  Constitutional:  General: She is not in acute distress.    Appearance: She is well-developed.  HENT:     Head: Normocephalic and atraumatic.  Eyes:     Conjunctiva/sclera: Conjunctivae normal.  Cardiovascular:     Rate and Rhythm: Normal rate and regular rhythm.     Heart sounds: No murmur heard. Pulmonary:     Effort: Pulmonary effort is normal. No respiratory distress.     Breath sounds: Normal breath sounds.  Abdominal:     Palpations: Abdomen is soft.     Tenderness: There is no abdominal tenderness.  Musculoskeletal:        General: No swelling.      Cervical back: Neck supple.     Right lower leg: No edema.     Left lower leg: No edema.     Comments: Patient with mild medial joint line tenderness to palpation.  Swelling appreciated in popliteal fossa.  No obvious overlying erythema.  Patient full range of motion of right knee.  Pedal pulses 2+ bilaterally.  No obvious tenderness to the calf or palpable nodule/cord.  Skin:    General: Skin is warm and dry.     Capillary Refill: Capillary refill takes less than 2 seconds.  Neurological:     Mental Status: She is alert.  Psychiatric:        Mood and Affect: Mood normal.     ED Results / Procedures / Treatments   Labs (all labs ordered are listed, but only abnormal results are displayed) Labs Reviewed - No data to display  EKG None  Radiology US Venous Img Lower Right (DVT Study)  Result Date: 05/14/2023 CLINICAL DATA:  Right calf pain EXAM: RIGHT LOWER EXTREMITY VENOUS DOPPLER ULTRASOUND TECHNIQUE: Gray-scale sonography with compression, as well as color and duplex ultrasound, were performed to evaluate the deep venous system(s) from the level of the common femoral vein through the popliteal and proximal calf veins. COMPARISON:  10/16/2021 FINDINGS: VENOUS Normal compressibility of the common femoral, superficial femoral, and popliteal veins, as well as the visualized calf veins. Visualized portions of profunda femoral vein and great saphenous vein unremarkable. No filling defects to suggest DVT on grayscale or color Doppler imaging. Doppler waveforms show normal direction of venous flow, normal respiratory plasticity and response to augmentation. Limited views of the contralateral common femoral vein are unremarkable. OTHER Right popliteal fossa fluid collection measuring up to 4.2 cm, likely Baker's cyst. Limitations: none IMPRESSION: 1. No evidence of right lower extremity DVT. 2. Baker's cyst. Electronically Signed   By: Duanne Guess D.O.   On: 05/14/2023 13:12     Procedures Procedures    Medications Ordered in ED Medications  ketorolac (TORADOL) 30 MG/ML injection 30 mg (30 mg Intramuscular Given 05/14/23 1304)    ED Course/ Medical Decision Making/ A&P                             Medical Decision Making Risk Prescription drug management.   This patient presents to the ED for concern of right knee pain, this involves an extensive number of treatment options, and is a complaint that carries with it a high risk of complications and morbidity.  The differential diagnosis includes fracture, dislocation, strain/sprain, osteoarthritis, septic arthritis, DVT, PAD   Co morbidities that complicate the patient evaluation  See HPI   Additional history obtained:  Additional history obtained from EMR External records from outside source obtained and reviewed including hospital records  Lab Tests:  N/a   Imaging Studies ordered:  I ordered imaging studies including right lower extremity ultrasound I independently visualized and interpreted imaging which showed Baker's cyst, negative DVT I agree with the radiologist interpretation   Cardiac Monitoring: / EKG:  The patient was maintained on a cardiac monitor.  I personally viewed and interpreted the cardiac monitored which showed an underlying rhythm of: Sinus rhythm   Consultations Obtained:  Na/   Problem List / ED Course / Critical interventions / Medication management  Right leg pain Reevaluation of the patient  showed that the patient stayed the same I have reviewed the patients home medicines and have made adjustments as needed   Social Determinants of Health:  Denies tobacco, illicit drug use   Test / Admission - Considered:  Right leg pain Vitals signs significant for hypertension with blood pressure 165/101. Otherwise within normal range and stable throughout visit. Imaging studies significant for: See above 57 year old female presents emergency department  with complaints of right knee pain as well as some right calf pain and remittent lower extremity swelling.  Patient had x-ray performed by orthopedics in the outpatient setting yesterday of which showed medial compartment osteoarthritis per patient but was sent to the emergency department due to calf pain with intermittent right lower extremity swelling.  Patient with DVT ultrasound negative for DVT.  Already placed on prednisone by orthopedics for patient's right knee pain.  Will recommend continued treatment of pain with medications prescribed, elevation, rest, compression therapy and recommend follow-up with orthopedics as scheduled for repeat assessment.  Treatment plan discussed at length with patient and she acknowledged understanding was agreeable to said plan. Worrisome signs and symptoms were discussed with the patient, and the patient acknowledged understanding to return to the ED if noticed. Patient was stable upon discharge.          Final Clinical Impression(s) / ED Diagnoses Final diagnoses:  Right leg pain    Rx / DC Orders ED Discharge Orders     None         Peter Garter, [place] 05/14/23 1705    Ernie Avena, MD 05/15/23 [phone]

## 2023-05-14 NOTE — ED Notes (Signed)
Pt received AVS; Educated on pain management and RICE; Pt verbalized understanding and had no further question upon discharge.

## 2023-05-16 ENCOUNTER — Other Ambulatory Visit (HOSPITAL_BASED_OUTPATIENT_CLINIC_OR_DEPARTMENT_OTHER): Payer: Self-pay | Admitting: Medical

## 2023-05-16 DIAGNOSIS — M79604 Pain in right leg: Secondary | ICD-10-CM

## 2023-06-08 ENCOUNTER — Encounter: Payer: Self-pay | Admitting: Internal Medicine

## 2023-06-17 ENCOUNTER — Telehealth: Payer: Self-pay

## 2023-06-17 NOTE — Telephone Encounter (Signed)
*  Primary  PA request received Ubrelvy 50MG  tablets  PA submitted to [employer] via [employer] and is pending additional questions/determination  Key: [id number]

## 2023-06-25 ENCOUNTER — Other Ambulatory Visit (HOSPITAL_COMMUNITY): Payer: Self-pay

## 2023-06-25 NOTE — Telephone Encounter (Signed)
Pharmacy Patient Advocate Encounter  Received notification from [facility] that Prior Authorization for Ubrelvy 50MG  tablets has been APPROVED from 06-23-2023 to 09-15-2023.Marland Kitchen  PA #/Case ID/Reference #: [id number] Key: [id number]  Copay is $0.00 with insurance max 16 tablets per 30 days

## 2023-09-15 ENCOUNTER — Telehealth: Payer: Self-pay | Admitting: Pharmacist

## 2023-09-15 NOTE — Telephone Encounter (Signed)
Pharmacy Patient Advocate Encounter   Received notification from [employer] that prior authorization for Ubrelvy 50MG  tablets is required/requested.   Insurance verification completed.   The patient is insured through [facility] .   Per test claim: PA required; PA started via CoverMyMeds. KEY [id number] . Waiting for clinical questions to populate.

## 2023-09-15 NOTE — Telephone Encounter (Signed)
Clinical questions have been submitted.

## 2023-09-16 ENCOUNTER — Other Ambulatory Visit (HOSPITAL_COMMUNITY): Payer: Self-pay

## 2023-09-16 NOTE — Telephone Encounter (Addendum)
Pharmacy Patient Advocate Encounter  Received notification from [facility] that Prior Authorization for Ubrelvy 50MG  tablets has been APPROVED from 09/16/23 to 09/14/24. Ran test claim, Copay is $0. This test claim was processed through [facility]- copay amounts may vary at other pharmacies due to pharmacy/plan contracts, or as the patient moves through the different stages of their insurance plan.   PA #/Case ID/Reference #: [id number]    Insurance will cover max #16 in 27 days

## 2023-09-20 ENCOUNTER — Ambulatory Visit: Payer: BLUE CROSS/BLUE SHIELD | Admitting: Internal Medicine

## 2023-09-20 ENCOUNTER — Encounter: Payer: Self-pay | Admitting: Internal Medicine

## 2023-09-20 VITALS — BP 160/100 | HR 109 | Temp 98.6°F | Ht 66.5 in | Wt 316.0 lb

## 2023-09-20 DIAGNOSIS — G43109 Migraine with aura, not intractable, without status migrainosus: Secondary | ICD-10-CM

## 2023-09-20 DIAGNOSIS — F321 Major depressive disorder, single episode, moderate: Secondary | ICD-10-CM

## 2023-09-20 DIAGNOSIS — I10 Essential (primary) hypertension: Secondary | ICD-10-CM

## 2023-09-20 MED ORDER — AIMOVIG 70 MG/ML ~~LOC~~ SOAJ: 70.0000 mg | SUBCUTANEOUS | 3 refills | Status: DC

## 2023-09-20 MED ORDER — METOPROLOL SUCCINATE ER 50 MG PO TB24: 50.0000 mg | ORAL_TABLET | Freq: Every day | ORAL | 3 refills | Status: DC

## 2023-09-20 MED ORDER — ESCITALOPRAM OXALATE 10 MG PO TABS: 10.0000 mg | ORAL_TABLET | Freq: Every day | ORAL | 3 refills | Status: DC

## 2023-09-20 NOTE — Progress Notes (Unsigned)
   Subjective:   Patient ID: Courtney Bates, female    DOB: 1966/04/12, 57 y.o.   MRN: [id number]  HPI The patient is a 57 YO female coming in for cough and headaches for several weeks now 3-4  Review of Systems  Constitutional: Negative.   HENT: Negative.    Eyes: Negative.   Respiratory:  Positive for cough. Negative for chest tightness and shortness of breath.   Cardiovascular:  Negative for chest pain, palpitations and leg swelling.  Gastrointestinal:  Negative for abdominal distention, abdominal pain, constipation, diarrhea, nausea and vomiting.  Musculoskeletal: Negative.   Skin: Negative.   Neurological:  Positive for headaches.  Psychiatric/Behavioral: Negative.      Objective:  Physical Exam Constitutional:      Appearance: She is well-developed.  HENT:     Head: Normocephalic and atraumatic.  Cardiovascular:     Rate and Rhythm: Normal rate and regular rhythm.  Pulmonary:     Effort: Pulmonary effort is normal. No respiratory distress.     Breath sounds: Normal breath sounds. No wheezing or rales.  Abdominal:     General: Bowel sounds are normal. There is no distension.     Palpations: Abdomen is soft.     Tenderness: There is no abdominal tenderness. There is no rebound.  Musculoskeletal:     Cervical back: Normal range of motion.  Skin:    General: Skin is warm and dry.  Neurological:     Mental Status: She is alert and oriented to person, place, and time.     Coordination: Coordination normal.     Vitals:   09/20/23 1521 09/20/23 1526  BP: (!) 160/100 (!) 160/100  Pulse: (!) 109   Temp: 98.6 F (37 C)   TempSrc: Oral   SpO2: 97%   Weight: (!) 316 lb (143.3 kg)   Height: 5' 6.5" (1.689 m)     Assessment & Plan:

## 2023-09-20 NOTE — Patient Instructions (Addendum)
We will start lexapro to take 1 pill daily to help with mood and anxiety. This can take 4 weeks to fully kick in.  We will increase metoprolol to 50 mg daily and have sent in a new prescription. Take 2 pills daily of what you have (2 of 25 mg tablets daily) until you run out.  We have sent in aimovig which is a monthly shot to help reduce or eliminate the migraines.

## 2023-09-22 DIAGNOSIS — F329 Major depressive disorder, single episode, unspecified: Secondary | ICD-10-CM | POA: Insufficient documentation

## 2023-09-22 NOTE — Assessment & Plan Note (Signed)
BP is high and having headaches lately. Increase metoprolol to 50 mg daily. Keep losartan/hydrochlorothiazide 100/25 mg daily and amlodipine 10 mg daily. Follow up 2-4 weeks.

## 2023-09-22 NOTE — Assessment & Plan Note (Signed)
With worsening headaches in the last year. Will rx aimovig to see if this is covered. In the meantime will increase metoprolol to 50 mg daily to see if this helps.

## 2023-09-22 NOTE — Assessment & Plan Note (Signed)
Rx lexapro 10 mg daily and follow up 1-2 months for efficacy.

## 2023-09-28 ENCOUNTER — Other Ambulatory Visit (HOSPITAL_COMMUNITY): Payer: Self-pay

## 2023-09-28 ENCOUNTER — Telehealth: Payer: Self-pay | Admitting: Pharmacist

## 2023-09-28 NOTE — Telephone Encounter (Signed)
Pharmacy Patient Advocate Encounter   Received notification from CoverMyMeds that prior authorization for Aimovig 70MG /ML auto-injectors is required/requested.   Insurance verification completed.   The patient is insured through [facility] .   Per test claim: PA required; PA started via CoverMyMeds. KEY [id number] . Waiting for clinical questions to populate.

## 2023-09-29 NOTE — Telephone Encounter (Signed)
 Clinical questions answered and PA submitted

## 2023-09-30 ENCOUNTER — Other Ambulatory Visit (HOSPITAL_COMMUNITY): Payer: Self-pay

## 2023-09-30 NOTE — Telephone Encounter (Signed)
Pharmacy Patient Advocate Encounter  Received notification from [facility] that Prior Authorization for Aimovig 70MG /ML auto-injectors has been APPROVED from 09/29/2023 to 12/22/2023. Ran test claim, Copay is $40.00. This test claim was processed through [facility]- copay amounts may vary at other pharmacies due to pharmacy/plan contracts, or as the patient moves through the different stages of their insurance plan.   PA #/Case ID/Reference #: [id number]

## 2023-10-16 ENCOUNTER — Other Ambulatory Visit: Payer: Self-pay | Admitting: Internal Medicine

## 2023-10-24 ENCOUNTER — Ambulatory Visit (INDEPENDENT_AMBULATORY_CARE_PROVIDER_SITE_OTHER): Payer: BLUE CROSS/BLUE SHIELD

## 2023-10-24 ENCOUNTER — Ambulatory Visit: Payer: BLUE CROSS/BLUE SHIELD | Admitting: Internal Medicine

## 2023-10-24 VITALS — BP 134/84 | HR 62 | Temp 97.9°F | Ht 66.5 in | Wt 311.0 lb

## 2023-10-24 DIAGNOSIS — G43109 Migraine with aura, not intractable, without status migrainosus: Secondary | ICD-10-CM | POA: Diagnosis not present

## 2023-10-24 DIAGNOSIS — R053 Chronic cough: Secondary | ICD-10-CM | POA: Diagnosis not present

## 2023-10-24 DIAGNOSIS — F321 Major depressive disorder, single episode, moderate: Secondary | ICD-10-CM | POA: Diagnosis not present

## 2023-10-24 DIAGNOSIS — R059 Cough, unspecified: Secondary | ICD-10-CM | POA: Diagnosis not present

## 2023-10-24 NOTE — Patient Instructions (Signed)
You can pick up the aimovig it was approved.  We will do the chest x-ray.

## 2023-10-24 NOTE — Progress Notes (Unsigned)
   Subjective:   Patient ID: Courtney Bates, female    DOB: 30-Jul-1966, 57 y.o.   MRN: [id number]  HPI The patient is a 57 YO female coming in for follow up migraine, depression and ongoing chronic cough.  Review of Systems  Constitutional: Negative.   HENT: Negative.    Eyes: Negative.   Respiratory:  Positive for cough. Negative for chest tightness, shortness of breath and wheezing.   Cardiovascular:  Negative for chest pain, palpitations and leg swelling.  Gastrointestinal:  Negative for abdominal distention, abdominal pain, constipation, diarrhea, nausea and vomiting.  Musculoskeletal: Negative.   Skin: Negative.   Neurological:  Positive for headaches.  Psychiatric/Behavioral: Negative.      Objective:  Physical Exam Constitutional:      Appearance: She is well-developed. She is obese.  HENT:     Head: Normocephalic and atraumatic.  Cardiovascular:     Rate and Rhythm: Normal rate and regular rhythm.  Pulmonary:     Effort: Pulmonary effort is normal. No respiratory distress.     Breath sounds: Normal breath sounds. No wheezing or rales.  Abdominal:     General: Bowel sounds are normal. There is no distension.     Palpations: Abdomen is soft.     Tenderness: There is no abdominal tenderness. There is no rebound.  Musculoskeletal:     Cervical back: Normal range of motion.  Skin:    General: Skin is warm and dry.  Neurological:     Mental Status: She is alert and oriented to person, place, and time.     Coordination: Coordination normal.     Vitals:   10/24/23 1426  BP: 134/84  Pulse: 62  Temp: 97.9 F (36.6 C)  TempSrc: Oral  SpO2: 98%  Weight: (!) 311 lb (141.1 kg)  Height: 5' 6.5" (1.689 m)    Assessment & Plan:

## 2023-10-25 DIAGNOSIS — R053 Chronic cough: Secondary | ICD-10-CM | POA: Insufficient documentation

## 2023-10-25 NOTE — Assessment & Plan Note (Signed)
Improving with lexapro 10 mg daily and she wishes to stay on this dose and see if this continues to improve. We discussed option to increase to 20 mg daily if needed and she will let us know if she decides to make this change. Still using trazodone 50 mg at bedtime as well for sleep.

## 2023-10-25 NOTE — Assessment & Plan Note (Signed)
Informed that aimovig was approved and she can get this at pharmacy. She will pick up. Increased metoprolol to 50 mg daily has helped BP stabilize but no significant change in headaches. Continue and using otc for pain as needed. <15 migraine days per month.

## 2023-10-25 NOTE — Assessment & Plan Note (Signed)
Suspect post nasal drip and/or GERD. No recent chest x-ray and ongoing for months without improvement. Ordered CXR today and treat as appropriate.

## 2023-11-25 DIAGNOSIS — R92323 Mammographic fibroglandular density, bilateral breasts: Secondary | ICD-10-CM | POA: Diagnosis not present

## 2023-11-25 DIAGNOSIS — Z1231 Encounter for screening mammogram for malignant neoplasm of breast: Secondary | ICD-10-CM | POA: Diagnosis not present

## 2023-11-25 LAB — HM MAMMOGRAPHY

## 2023-11-29 ENCOUNTER — Encounter: Payer: Self-pay | Admitting: Internal Medicine

## 2024-02-07 ENCOUNTER — Ambulatory Visit (INDEPENDENT_AMBULATORY_CARE_PROVIDER_SITE_OTHER): Payer: Commercial Managed Care - PPO | Admitting: Nurse Practitioner

## 2024-02-07 VITALS — BP 132/82 | HR 88 | Ht 66.0 in | Wt 311.0 lb

## 2024-02-07 DIAGNOSIS — K644 Residual hemorrhoidal skin tags: Secondary | ICD-10-CM | POA: Diagnosis not present

## 2024-02-07 DIAGNOSIS — Z01419 Encounter for gynecological examination (general) (routine) without abnormal findings: Secondary | ICD-10-CM

## 2024-02-07 DIAGNOSIS — Z1331 Encounter for screening for depression: Secondary | ICD-10-CM | POA: Diagnosis not present

## 2024-02-07 DIAGNOSIS — Z780 Asymptomatic menopausal state: Secondary | ICD-10-CM | POA: Diagnosis not present

## 2024-02-07 DIAGNOSIS — F321 Major depressive disorder, single episode, moderate: Secondary | ICD-10-CM

## 2024-02-07 NOTE — Progress Notes (Signed)
 Courtney Bates [birth year] [id number]   History:  58 y.o. G1P0012 presents for annual exam. Postmenopausal - no HRT, no bleeding. Normal pap and mammogram history. HTN, OAB, migraines, depression managed by PCP. Complains of hemorrhoids. Using OTC cream occasionally. Has constipation. No discomfort, just notices small amount of blood with wiping.   Gynecologic History No LMP recorded. Patient is postmenopausal. Contraception: post menopausal status   Sexually active: No  Health maintenance Last Pap: 11/14/2019. Results were: Normal neg HPV Last mammogram: 11/25/2023. Results were: Normal Last colonoscopy: 02/07/2017. Results were: Normal, 10-year recall Last Dexa: Never  Flowsheet Row Office Visit from 02/07/2024 in [facility]  PHQ-9 Total Score 11        Past medical history, past surgical history, family history and social history were all reviewed and documented in the EPIC chart. Single. 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 Son has  boys ages 55 and 58.   ROS:  A ROS was performed and pertinent positives and negatives are included.  Exam:  Vitals:   02/07/24 1453  BP: 132/82  Pulse: 88  SpO2: 100%  Weight: (!) 311 lb (141.1 kg)  Height: 5\' 6"  (1.676 m)      Body mass index is 50.2 kg/m.  General appearance:  Normal Thyroid:  Symmetrical, normal in size, without palpable masses or nodularity. Respiratory  Auscultation:  Clear without wheezing or rhonchi Cardiovascular  Auscultation:  Regular rate, without rubs, murmurs or gallops  Edema/varicosities:  Not grossly evident Abdominal  Soft,nontender, without masses, guarding or rebound.  Liver/spleen:  No organomegaly noted  Hernia:  None appreciated  Skin  Inspection:  Grossly normal Breasts: Examined lying and sitting.   Right: Without masses, retractions, nipple discharge or  axillary adenopathy.   Left: Without masses, retractions, nipple discharge or axillary adenopathy. Pelvic: External genitalia:  no lesions              Urethra:  normal appearing urethra with no masses, tenderness or lesions              Bartholins and Skenes: normal                 Vagina: normal appearing vagina with normal color and discharge, no lesions              Cervix: no lesions Bimanual Exam:  Uterus:  no masses or tenderness              Adnexa: no mass, fullness, tenderness              Rectovaginal: Deferred              Anus:  1 non-bleeding hemorrhoid  Patient informed chaperone available to be present for breast and pelvic exam. Patient has requested no chaperone to be present. Patient has been advised what will be completed during breast and pelvic exam.   Assessment/Plan:  58 y.o. [id number] for annual exam.   Well female exam with routine gynecological exam - Education provided on SBEs, importance of preventative screenings, current guidelines, high calcium diet, regular exercise, and multivitamin daily. Labs with PCP.   Postmenopausal - no HRT, no bleeding  External hemorrhoid - non-bleeding. Recommend preparation-H BID x 7 days during flare. Avoid constipation, use stool softeners, increase water and fiber intake.   Current moderate episode of major depressive disorder without prior episode (HCC) - On  Lexapro. Denies SI/HI. PCP managing.   Screening for cervical cancer - Normal Pap history.  Will repeat at 5-year interval per guidelines.  Screening for breast cancer - Normal mammogram history.  Continue annual screenings.  Normal breast exam today.   Screening for colon cancer - Normal screening colonoscopy in 2018.  She will repeat at 10-year interval per GI recommendation.   Return in about 1 year (around 02/06/2025) for Annual.        Olivia Mackie [facility], 3:16 PM 02/07/2024

## 2024-03-08 ENCOUNTER — Other Ambulatory Visit: Payer: Self-pay | Admitting: Internal Medicine

## 2024-03-19 ENCOUNTER — Ambulatory Visit: Admitting: Internal Medicine

## 2024-03-19 ENCOUNTER — Encounter: Payer: Self-pay | Admitting: Internal Medicine

## 2024-03-19 VITALS — BP 168/100 | HR 107 | Temp 98.4°F | Ht 66.0 in | Wt 311.0 lb

## 2024-03-19 DIAGNOSIS — N3281 Overactive bladder: Secondary | ICD-10-CM

## 2024-03-19 DIAGNOSIS — R350 Frequency of micturition: Secondary | ICD-10-CM | POA: Diagnosis not present

## 2024-03-19 DIAGNOSIS — I10 Essential (primary) hypertension: Secondary | ICD-10-CM

## 2024-03-19 DIAGNOSIS — M170 Bilateral primary osteoarthritis of knee: Secondary | ICD-10-CM

## 2024-03-19 LAB — POCT URINALYSIS DIPSTICK
Bilirubin, UA: POSITIVE
Blood, UA: POSITIVE
Glucose, UA: NEGATIVE
Ketones, UA: NEGATIVE
Leukocytes, UA: NEGATIVE
Nitrite, UA: NEGATIVE
Protein, UA: POSITIVE — AB
Spec Grav, UA: >=1.03 — AB (ref 1.010–1.025)
Urobilinogen, UA: 1 U/dL — NL
pH, UA: 6 (ref 5.0–8.0)

## 2024-03-19 MED ORDER — SOLIFENACIN SUCCINATE 10 MG PO TABS: 10.0000 mg | ORAL_TABLET | Freq: Every day | ORAL | 3 refills | Status: DC

## 2024-03-19 NOTE — Patient Instructions (Addendum)
 We have sent in vesicare to take 1 pill daily. It is okay to stop the oxybutynin.  You do not have a urine infection today.   Let us know in 2-3 weeks how this new medicine is doing.

## 2024-03-19 NOTE — Progress Notes (Unsigned)
   Subjective:   Patient ID: Courtney Bates, female    DOB: 1966/06/17, 58 y.o.   MRN: [id number]  HPI The patent is a 58 YO female coming in for urinary issues. Cannot hold and having accidents. This is worsening over time. She did increase oxybuynin last year and this did not help any more with change in dose.   Review of Systems  Constitutional: Negative.   HENT: Negative.    Eyes: Negative.   Respiratory:  Negative for cough, chest tightness and shortness of breath.   Cardiovascular:  Negative for chest pain, palpitations and leg swelling.  Gastrointestinal:  Negative for abdominal distention, abdominal pain, constipation, diarrhea, nausea and vomiting.  Genitourinary:  Positive for frequency and urgency.       Incontinence  Musculoskeletal:  Positive for arthralgias and gait problem.  Skin: Negative.   Psychiatric/Behavioral: Negative.      Objective:  Physical Exam Constitutional:      Appearance: She is well-developed. She is obese.  HENT:     Head: Normocephalic and atraumatic.  Cardiovascular:     Rate and Rhythm: Normal rate and regular rhythm.  Pulmonary:     Effort: Pulmonary effort is normal. No respiratory distress.     Breath sounds: Normal breath sounds. No wheezing or rales.  Abdominal:     General: Bowel sounds are normal. There is no distension.     Palpations: Abdomen is soft.     Tenderness: There is no abdominal tenderness. There is no rebound.  Musculoskeletal:        General: Tenderness present.     Cervical back: Normal range of motion.  Skin:    General: Skin is warm and dry.  Neurological:     Mental Status: She is alert and oriented to person, place, and time.     Coordination: Coordination normal.     Vitals:   03/19/24 1502  BP: (!) 168/100  Pulse: (!) 107  Temp: 98.4 F (36.9 C)  TempSrc: Oral  SpO2: 96%  Weight: (!) 311 lb (141.1 kg)  Height: 5\' 6"  (1.676 m)    Assessment & Plan:

## 2024-03-22 NOTE — Assessment & Plan Note (Signed)
 U/A POC done to rule out infection. We will stop oxybutynin and start vesicare 10 mg daily.

## 2024-03-22 NOTE — Assessment & Plan Note (Signed)
 Weight stable with BMI 50 and likely contributing to her overactive bladder. We discussed with more weight there is more pressure on the bladder. Counseled about diet. Unable to exercise due to arthritis.

## 2024-03-22 NOTE — Assessment & Plan Note (Signed)
 Unable to exercise currently.

## 2024-03-22 NOTE — Assessment & Plan Note (Signed)
 BP normal at home and did forget to take meds today. Continue metoprolol 50 mg daily and losartan/hydrochlorothiazide 100/25 and amlodipine 10 mg daily.

## 2024-05-25 ENCOUNTER — Other Ambulatory Visit: Payer: Self-pay | Admitting: Internal Medicine

## 2024-05-25 DIAGNOSIS — K219 Gastro-esophageal reflux disease without esophagitis: Secondary | ICD-10-CM

## 2024-06-07 ENCOUNTER — Other Ambulatory Visit: Payer: Self-pay | Admitting: Internal Medicine

## 2024-06-07 DIAGNOSIS — I10 Essential (primary) hypertension: Secondary | ICD-10-CM

## 2024-07-14 ENCOUNTER — Other Ambulatory Visit: Payer: Self-pay | Admitting: Internal Medicine

## 2024-08-23 ENCOUNTER — Other Ambulatory Visit: Payer: Self-pay | Admitting: Internal Medicine

## 2024-09-08 ENCOUNTER — Other Ambulatory Visit: Payer: Self-pay | Admitting: Internal Medicine

## 2024-09-08 DIAGNOSIS — I10 Essential (primary) hypertension: Secondary | ICD-10-CM

## 2024-09-11 ENCOUNTER — Encounter: Payer: Self-pay | Admitting: Internal Medicine

## 2024-09-11 ENCOUNTER — Other Ambulatory Visit: Payer: Self-pay | Admitting: Internal Medicine

## 2024-09-11 ENCOUNTER — Ambulatory Visit: Admitting: Internal Medicine

## 2024-09-11 VITALS — BP 140/80 | HR 72 | Ht 66.0 in | Wt 312.0 lb

## 2024-09-11 DIAGNOSIS — F321 Major depressive disorder, single episode, moderate: Secondary | ICD-10-CM | POA: Diagnosis not present

## 2024-09-11 DIAGNOSIS — I10 Essential (primary) hypertension: Secondary | ICD-10-CM

## 2024-09-11 DIAGNOSIS — Z0000 Encounter for general adult medical examination without abnormal findings: Secondary | ICD-10-CM | POA: Diagnosis not present

## 2024-09-11 DIAGNOSIS — Z23 Encounter for immunization: Secondary | ICD-10-CM

## 2024-09-11 DIAGNOSIS — E538 Deficiency of other specified B group vitamins: Secondary | ICD-10-CM | POA: Diagnosis not present

## 2024-09-11 DIAGNOSIS — M170 Bilateral primary osteoarthritis of knee: Secondary | ICD-10-CM | POA: Diagnosis not present

## 2024-09-11 DIAGNOSIS — K219 Gastro-esophageal reflux disease without esophagitis: Secondary | ICD-10-CM

## 2024-09-11 LAB — LIPID PANEL
Cholesterol: 207 mg/dL — ABNORMAL HIGH (ref 0–200)
HDL: 37.3 mg/dL — ABNORMAL LOW (ref >39.00)
LDL Cholesterol: 126 mg/dL — ABNORMAL HIGH (ref 0–99)
NonHDL: 169.29
Total CHOL/HDL Ratio: 6
Triglycerides: 214 mg/dL — ABNORMAL HIGH (ref 0.0–149.0)
VLDL: 42.8 mg/dL — ABNORMAL HIGH (ref 0.0–40.0)

## 2024-09-11 LAB — CBC
HCT: 42.8 % (ref 36.0–46.0)
Hemoglobin: 14.5 g/dL (ref 12.0–15.0)
MCHC: 33.9 g/dL (ref 30.0–36.0)
MCV: 81.1 fl (ref 78.0–100.0)
Platelets: 329 K/uL (ref 150.0–400.0)
RBC: 5.28 Mil/uL — ABNORMAL HIGH (ref 3.87–5.11)
RDW: 13.5 % (ref 11.5–15.5)
WBC: 10.5 K/uL (ref 4.0–10.5)

## 2024-09-11 LAB — COMPREHENSIVE METABOLIC PANEL WITH GFR
ALT: 10 U/L (ref 0–35)
AST: 15 U/L (ref 0–37)
Albumin: 4.6 g/dL (ref 3.5–5.2)
Alkaline Phosphatase: 93 U/L (ref 39–117)
BUN: 15 mg/dL (ref 6–23)
CO2: 30 meq/L (ref 19–32)
Calcium: 9.6 mg/dL (ref 8.4–10.5)
Chloride: 104 meq/L (ref 96–112)
Creatinine, Ser: 1.1 mg/dL (ref 0.40–1.20)
GFR: 55.68 mL/min — ABNORMAL LOW (ref >60.00)
Glucose, Bld: 98 mg/dL (ref 70–99)
Potassium: 3.7 meq/L (ref 3.5–5.1)
Sodium: 142 meq/L (ref 135–145)
Total Bilirubin: 0.5 mg/dL (ref 0.2–1.2)
Total Protein: 7.1 g/dL (ref 6.0–8.3)

## 2024-09-11 LAB — VITAMIN D 25 HYDROXY (VIT D DEFICIENCY, FRACTURES): VITD: 15.01 ng/mL — ABNORMAL LOW (ref 30.00–100.00)

## 2024-09-11 LAB — VITAMIN B12: Vitamin B-12: 248 pg/mL (ref 211–911)

## 2024-09-11 LAB — HEMOGLOBIN A1C: Hgb A1c MFr Bld: 5.1 % (ref 4.6–6.5)

## 2024-09-11 MED ORDER — PANTOPRAZOLE SODIUM 40 MG PO TBEC: 40.0000 mg | DELAYED_RELEASE_TABLET | Freq: Every day | ORAL | 3 refills | Status: AC

## 2024-09-11 MED ORDER — ZEPBOUND 10 MG/0.5ML ~~LOC~~ SOAJ: 10.0000 mg | SUBCUTANEOUS | 0 refills | Status: AC

## 2024-09-11 MED ORDER — ZEPBOUND 2.5 MG/0.5ML ~~LOC~~ SOAJ: 2.5000 mg | SUBCUTANEOUS | 0 refills | Status: DC

## 2024-09-11 MED ORDER — AMLODIPINE BESYLATE 10 MG PO TABS: 10.0000 mg | ORAL_TABLET | Freq: Every day | ORAL | 3 refills | Status: AC

## 2024-09-11 MED ORDER — SOLIFENACIN SUCCINATE 10 MG PO TABS: 10.0000 mg | ORAL_TABLET | Freq: Every day | ORAL | 3 refills | Status: DC

## 2024-09-11 MED ORDER — ZEPBOUND 7.5 MG/0.5ML ~~LOC~~ SOAJ: 7.5000 mg | SUBCUTANEOUS | 0 refills | Status: AC

## 2024-09-11 MED ORDER — LOSARTAN POTASSIUM-HCTZ 100-25 MG PO TABS: 1.0000 | ORAL_TABLET | Freq: Every day | ORAL | 3 refills | Status: AC

## 2024-09-11 MED ORDER — ZEPBOUND 5 MG/0.5ML ~~LOC~~ SOAJ: 5.0000 mg | SUBCUTANEOUS | 0 refills | Status: DC

## 2024-09-11 MED ORDER — MELOXICAM 15 MG PO TABS: 15.0000 mg | ORAL_TABLET | Freq: Every day | ORAL | 3 refills | Status: AC

## 2024-09-11 MED ORDER — TRAZODONE HCL 50 MG PO TABS: 50.0000 mg | ORAL_TABLET | Freq: Every day | ORAL | 3 refills | Status: DC

## 2024-09-11 MED ORDER — METOPROLOL SUCCINATE ER 50 MG PO TB24: 50.0000 mg | ORAL_TABLET | Freq: Every day | ORAL | 3 refills | Status: AC

## 2024-09-11 MED ORDER — TOPIRAMATE 50 MG PO TABS: 50.0000 mg | ORAL_TABLET | Freq: Every day | ORAL | 3 refills | Status: DC

## 2024-09-11 MED ORDER — ESCITALOPRAM OXALATE 20 MG PO TABS: 20.0000 mg | ORAL_TABLET | Freq: Every day | ORAL | 3 refills | Status: AC

## 2024-09-11 NOTE — Progress Notes (Unsigned)
   Subjective:   Patient ID: Courtney Bates, female    DOB: [birth year], 58 y.o.   MRN: [id number]  Discussed the use of AI scribe software for clinical note transcription with the patient, who gave verbal consent to proceed.  History of Present Illness Courtney Bates is a 58 year old female who presents with worsening knee pain following a fall.  She has been experiencing worsening bilateral knee pain, which has gradually increased over time and became more pronounced after a fall on 2025-08-23coinciding with a stressful family event. The pain is described as a sensation of 'vacuums on the back' of her knees, with a pulling sensation. She has previously received injections for her knee pain, but it has been a while since her last treatment.  She reports increased anxiety and mood disturbances since the family event in August, with heightened anxiety during tasks such as driving at night and crossing bridges. She is currently taking escitalopram , initially at 10 mg, but has noted a decrease in its effectiveness over time.  No new chest pain, tightness, pressure, or breathing troubles. She continues to experience heartburn if she does not take her medication, which she finds effective when taken regularly.  Her social history includes significant stress related to her daughter's recent stillbirth, which has impacted her emotional well-being. She is trying to support her daughter and son-in-law through this difficult time.  Review of Systems  Objective:  Physical Exam  Vitals:   09/11/24 1521 09/11/24 1523  BP: (!) 140/80 (!) 140/80  Pulse: 72   TempSrc: Oral   SpO2: 98%   Weight: (!) 312 lb (141.5 kg)   Height: 5' 6 (1.676 m)     Assessment and Plan Assessment & Plan Bilateral knee pain with swelling, worsening since fall   Chronic bilateral knee pain with swelling has worsened since a fall. Previous injections provided relief, so another round is being considered. She will be  referred to sports medicine for evaluation and possible injections.  Depression and anxiety, currently managed with escitalopram    Depression and anxiety have worsened following a family tragedy. Although escitalopram  was initially effective, increased anxiety and mood disturbances suggest a need for dosage adjustment. Increase escitalopram  to 20 mg daily and monitor for symptom improvement over the next few weeks. Consider alternative medication if there is no improvement with the increased dose.  Gastroesophageal reflux disease, controlled with medication   GERD is well-controlled with medication, though symptoms recur if doses are missed.  Vitamin D  deficiency, suspected   Suspected vitamin D  deficiency due to fatigue and low energy, possibly stress-related. Order blood work to assess vitamin D  levels.  General Health Maintenance   Discussed pneumonia vaccine eligibility for those aged 50+, explaining benefits and minimal side effects. Administer the pneumonia vaccine and check blood work to assess overall health status.

## 2024-09-12 ENCOUNTER — Other Ambulatory Visit (HOSPITAL_COMMUNITY): Payer: Self-pay

## 2024-09-12 ENCOUNTER — Telehealth: Payer: Self-pay

## 2024-09-12 NOTE — Telephone Encounter (Signed)
 Pharmacy Patient Advocate Encounter   Received notification from CoverMyMeds that prior authorization for Zepbound 5MG /0.5ML pen-injectors is required/requested.   Insurance verification completed.   The patient is insured through [facility].   Per test claim: PA required; PA submitted to above mentioned insurance via Latent Key/confirmation #/EOC [id number] Status is pending  SENT TO [email or web address] 09/13/2024 TO START A PRIOR AUTHORIZATION PA submitted to above mentioned insurance via Prompt PA Key/confirmation #/EOC [id number] Status is pending

## 2024-09-13 ENCOUNTER — Ambulatory Visit: Payer: Self-pay | Admitting: Internal Medicine

## 2024-09-13 MED ORDER — VITAMIN D (ERGOCALCIFEROL) 1.25 MG (50000 UNIT) PO CAPS: 50000.0000 [IU] | ORAL_CAPSULE | ORAL | 0 refills | Status: DC

## 2024-09-14 ENCOUNTER — Other Ambulatory Visit (HOSPITAL_COMMUNITY): Payer: Self-pay

## 2024-09-14 NOTE — Assessment & Plan Note (Signed)
 Rx zepbound first 3 months to help with weight. She is having chronic knee pain.

## 2024-09-14 NOTE — Telephone Encounter (Signed)
 Pharmacy Patient Advocate Encounter  Received notification from [employer] that Prior Authorization for Zepbound 5MG /0.5ML pen-injectors has been APPROVED from 09/13/24 to 09/12/25   PA #/Case ID/Reference #: [id number]

## 2024-09-14 NOTE — Assessment & Plan Note (Signed)
Checking B12 level and adjust as needed.  

## 2024-09-14 NOTE — Assessment & Plan Note (Signed)
 Worsening lately. Depression and anxiety have worsened following a family tragedy. Although escitalopram  was initially effective, increased anxiety and mood disturbances suggest a need for dosage adjustment. Increase escitalopram  to 20 mg daily and monitor for symptom improvement over the next few weeks. Consider alternative medication if there is no improvement with the increased dose.

## 2024-09-14 NOTE — Assessment & Plan Note (Signed)
 Chronic bilateral knee pain with swelling has worsened since a fall. Previous injections provided relief, so another round is being considered. She will be referred to sports medicine for evaluation and possible injections.

## 2024-09-14 NOTE — Assessment & Plan Note (Signed)
 Flu shot yearly counseled. Pneumonia given 20. Shingrix complete. Tetanus up to date. Colonoscopy up to date. Mammogram up to date, pap smear up to date. Counseled about sun safety and mole surveillance. Counseled about the dangers of distracted driving. Given 10 year screening recommendations.

## 2024-09-24 ENCOUNTER — Other Ambulatory Visit (HOSPITAL_COMMUNITY): Payer: Self-pay

## 2024-10-01 ENCOUNTER — Other Ambulatory Visit: Payer: Self-pay | Admitting: Internal Medicine

## 2024-10-03 ENCOUNTER — Ambulatory Visit (INDEPENDENT_AMBULATORY_CARE_PROVIDER_SITE_OTHER)

## 2024-10-03 ENCOUNTER — Other Ambulatory Visit: Payer: Self-pay

## 2024-10-03 ENCOUNTER — Encounter: Payer: Self-pay | Admitting: Family Medicine

## 2024-10-03 ENCOUNTER — Ambulatory Visit: Admitting: Family Medicine

## 2024-10-03 VITALS — BP 130/88 | HR 85 | Ht 66.0 in | Wt 311.0 lb

## 2024-10-03 DIAGNOSIS — G8929 Other chronic pain: Secondary | ICD-10-CM | POA: Diagnosis not present

## 2024-10-03 DIAGNOSIS — M170 Bilateral primary osteoarthritis of knee: Secondary | ICD-10-CM | POA: Diagnosis not present

## 2024-10-03 DIAGNOSIS — M25561 Pain in right knee: Secondary | ICD-10-CM | POA: Diagnosis not present

## 2024-10-03 DIAGNOSIS — M25562 Pain in left knee: Secondary | ICD-10-CM | POA: Diagnosis not present

## 2024-10-03 NOTE — Patient Instructions (Addendum)
 Thank you for coming in today.   You received an injection today. Seek immediate medical attention if the joint becomes red, extremely painful, or is oozing fluid.   Please get an Xray today before you leave.  Let us  know if your improvement doesn't last 12 weeks and we can discuss next steps.   See you back as needed.

## 2024-10-03 NOTE — Progress Notes (Unsigned)
 I, Leotis Batter, CMA acting as a scribe for Artist Lloyd, MD.  Courtney Bates is a 58 y.o. female who presents to [facility] today for bilat knee pain progressively worsening since a fall on 8/3. Pt locates pain to joint line. Swelling present bilaterally. Sx worse with sit-to-sand, stairs, knee flexion.   Knee swelling: bilaterally Mechanical symptoms: bilaterally Radiates: n/t right thigh with prolonged standing.  Aggravates: knee flexion, sit-to-stand, stairs Treatments tried: topical analgesics, IBU, Tylenol , compression, bracing,   Dx testing: 05/14/23 R LE vasc US  02/19/20 R knee XR  Pertinent review of systems: No fevers or chills  Relevant historical information: Hypertension   Exam:  BP 130/88   Pulse 85   Ht 5' 6 (1.676 m)   Wt (!) 311 lb (141.1 kg)   LMP 09/27/2017   SpO2 97%   BMI 50.20 kg/m  General: Well Developed, well nourished, and in no acute distress.   MSK: Bilateral knees mild effusion normal.  Otherwise normal motion with crepitation.    Lab and Radiology Results  Procedure: Real-time Ultrasound Guided Injection of right knee joint superior lateral patella space Device: Philips Affiniti 50G/GE Logiq Images permanently stored and available for review in PACS Verbal informed consent obtained.  Discussed risks and benefits of procedure. Warned about infection, bleeding, hyperglycemia damage to structures among others. Patient expresses understanding and agreement Time-out conducted.   Noted no overlying erythema, induration, or other signs of local infection.   Skin prepped in a sterile fashion.   Local anesthesia: Topical Ethyl chloride.   With sterile technique and under real time ultrasound guidance: 40 mg of Depo-Medrol  and 2 mL of Marcaine  injected into knee joint. Fluid seen entering the joint capsule.   Completed without difficulty   Pain immediately resolved suggesting accurate placement of the medication.    Advised to call if fevers/chills, erythema, induration, drainage, or persistent bleeding.   Images permanently stored and available for review in the ultrasound unit.  Impression: Technically successful ultrasound guided injection.   Procedure: Real-time Ultrasound Guided Injection of left knee joint superior lateral patella space Device: Philips Affiniti 50G/GE Logiq Images permanently stored and available for review in PACS Verbal informed consent obtained.  Discussed risks and benefits of procedure. Warned about infection, bleeding, hyperglycemia damage to structures among others. Patient expresses understanding and agreement Time-out conducted.   Noted no overlying erythema, induration, or other signs of local infection.   Skin prepped in a sterile fashion.   Local anesthesia: Topical Ethyl chloride.   With sterile technique and under real time ultrasound guidance: 40 mg of Depo-Medrol  and 2 mL of Marcaine  injected into knee joint. Fluid seen entering the joint capsule.   Completed without difficulty   Pain immediately resolved suggesting accurate placement of the medication.   Advised to call if fevers/chills, erythema, induration, drainage, or persistent bleeding.   Images permanently stored and available for review in the ultrasound unit.  Impression: Technically successful ultrasound guided injection.      X-ray images bilateral knees obtained today personally and independently interpreted.  Right knee: Severe medial DJD.  No acute fractures.  Severe patellofemoral DJD.  Left knee: Severe medial DJD.  No acute fractures.  Await formal radiology review  Assessment and Plan: 58 y.o. female with bilateral knee pain due to severe DJD.  Plan for steroid injection today. If this injection does not last 3 months or longer her next step would be Zilretta  or gel injections.  She is  also working on weight loss which should help.  PDMP not reviewed this encounter. Orders Placed  This Encounter  Procedures   US  LIMITED JOINT SPACE STRUCTURES LOW BILAT(NO LINKED CHARGES)    Reason for Exam (SYMPTOM  OR DIAGNOSIS REQUIRED):   bilat knee pain    Preferred imaging location?:   [facility] Knee AP/LAT W/Sunrise Left    Standing Status:   Future    Number of Occurrences:   1    Expiration Date:   11/03/2024    Reason for Exam (SYMPTOM  OR DIAGNOSIS REQUIRED):   bilat knee pain    Preferred imaging location?:   [place]    Is patient pregnant?:   No   DG Knee AP/LAT W/Sunrise Right    Standing Status:   Future    Number of Occurrences:   1    Expiration Date:   11/03/2024    Reason for Exam (SYMPTOM  OR DIAGNOSIS REQUIRED):   bilat knee pain    Preferred imaging location?:   [place]    Is patient pregnant?:   No   No orders of the defined types were placed in this encounter.    Discussed warning signs or symptoms. Please see discharge instructions. Patient expresses understanding.   The above documentation has been reviewed and is accurate and complete Artist Lloyd, M.D.

## 2024-10-04 ENCOUNTER — Other Ambulatory Visit: Payer: Self-pay | Admitting: Internal Medicine

## 2024-10-08 ENCOUNTER — Ambulatory Visit: Payer: Self-pay | Admitting: Family Medicine

## 2024-10-08 NOTE — Progress Notes (Signed)
Right knee x-ray shows severe arthritis

## 2024-10-08 NOTE — Progress Notes (Signed)
 Left knee x-ray shows severe arthritis.

## 2024-10-15 NOTE — Telephone Encounter (Signed)
 Forwarding to Dr. Denyse Amass to review and advise.

## 2024-10-24 ENCOUNTER — Other Ambulatory Visit: Payer: Self-pay | Admitting: Internal Medicine

## 2024-10-25 ENCOUNTER — Telehealth: Payer: Self-pay

## 2024-10-25 NOTE — Telephone Encounter (Signed)
 Please check benefits for Zilretta  for bilat knee OA.   New start  Pt would like to be contacted ASAP about scheduling once benefits verification has been completed.

## 2024-10-25 NOTE — Telephone Encounter (Signed)
 Josetta KANDICE Shelter to [facility] Clinical (supporting Artist GORMAN Lloyd, MD) (Selected Message)     10/24/24  6:25 PM Yes sir, you can go ahead and plan for it ASAP. Thank you.  I'm not sure when I should make the appointment for it DG Knee AP/LAT W/Sunrise Right Lloyd Artist GORMAN, MD to LAELA DEVINEY     10/16/24  6:48 AM My plan at the last visit was if the cortisone injection did not last long we would switch to Zilretta .  Would you like me to work on Zilretta  injections which might work more effectively for you?  Last read by Josetta KANDICE Shelter at 6:22PM on 10/24/2024. DG Knee AP/LAT W/Sunrise Right

## 2024-10-25 NOTE — Telephone Encounter (Signed)
 Patient ran for Zilretta  for bilateral knees on 10/25/24. Case ID: [id number]. Pending approval.

## 2024-10-26 NOTE — Telephone Encounter (Signed)
 Please schedule patient when mediation is stocked   Zilretta  authorized for bilateral knee NO PRE CERT REQUIRED Copay $50 Deductible does not apply OOP MAX $3000 Has met $476.22 Once OOP has been met coverage goes to 100% Reference # [id number]

## 2024-10-26 NOTE — Telephone Encounter (Signed)
 Scheduled 01/04/24

## 2024-11-13 ENCOUNTER — Other Ambulatory Visit: Payer: Self-pay

## 2024-11-13 ENCOUNTER — Ambulatory Visit: Admitting: Family Medicine

## 2024-11-13 VITALS — BP 146/92 | HR 91 | Ht 66.0 in | Wt 304.0 lb

## 2024-11-13 DIAGNOSIS — M25562 Pain in left knee: Secondary | ICD-10-CM | POA: Diagnosis not present

## 2024-11-13 DIAGNOSIS — G8929 Other chronic pain: Secondary | ICD-10-CM | POA: Diagnosis not present

## 2024-11-13 DIAGNOSIS — M170 Bilateral primary osteoarthritis of knee: Secondary | ICD-10-CM | POA: Diagnosis not present

## 2024-11-13 DIAGNOSIS — M25561 Pain in right knee: Secondary | ICD-10-CM

## 2024-11-13 MED ORDER — TRIAMCINOLONE ACETONIDE 32 MG IX SRER
32.0000 mg | Freq: Once | INTRA_ARTICULAR | Status: AC
  Administered 2024-11-13: 32 mg via INTRA_ARTICULAR

## 2024-11-13 NOTE — Progress Notes (Unsigned)
 LILLETTE Ileana Collet, PhD, LAT, ATC acting as a scribe for Artist Lloyd, MD.  Courtney Bates is a 58 y.o. female who presents to [facility] at [facility] [address] [facility] today for exacerbation of her bilat knee pain. Pt was last seen by Dr. Lloyd on 10/03/24 and was given bilat knee steroid injections.  She sent a MyChart message on 10/12/24 cont'd knee pain and Zilretta  was authorized.  Today, pt reports prior CSI didn't provide any relief. She is wanting to try something to help w/ her knee pain.  Dx testing: 10/03/24 R & L knee XR 05/14/23 R LE vasc US  02/19/20 R knee XR  Pertinent review of systems: No fevers or chills  Relevant historical information: Obesity migraine GERD   Exam:  BP (!) 146/92   Pulse 91   Ht 5' 6 (1.676 m)   Wt (!) 304 lb (137.9 kg)   LMP 09/27/2017   SpO2 96%   BMI 49.07 kg/m  General: Well Developed, well nourished, and in no acute distress.   MSK: Bilateral knees mild effusion normal motion with crepitation.    Lab and Radiology Results   Zilretta  injection bilateral knee Procedure: Real-time Ultrasound Guided Injection of right knee joint superior lateral patellar space Device: Philips Affiniti 50G Images permanently stored and available for review in PACS Verbal informed consent obtained.  Discussed risks and benefits of procedure. Warned about infection, hyperglycemia bleeding, damage to structures among others. Patient expresses understanding and agreement Time-out conducted.   Noted no overlying erythema, induration, or other signs of local infection.   Skin prepped in a sterile fashion.   Local anesthesia: Topical Ethyl chloride.   With sterile technique and under real time ultrasound guidance: Zilretta  32 mg injected into knee joint. Fluid seen entering the joint capsule.   Completed without difficulty   Advised to call if fevers/chills, erythema, induration, drainage, or persistent bleeding.   Images permanently stored and available  for review in the ultrasound unit.  Impression: Technically successful ultrasound guided injection.  Procedure: Real-time Ultrasound Guided Injection of left knee joint superior lateral patellar space Device: Philips Affiniti 50G Images permanently stored and available for review in PACS Verbal informed consent obtained.  Discussed risks and benefits of procedure. Warned about infection, hyperglycemia bleeding, damage to structures among others. Patient expresses understanding and agreement Time-out conducted.   Noted no overlying erythema, induration, or other signs of local infection.   Skin prepped in a sterile fashion.   Local anesthesia: Topical Ethyl chloride.   With sterile technique and under real time ultrasound guidance: Zilretta  32 mg injected into knee joint. Fluid seen entering the joint capsule.   Completed without difficulty   Advised to call if fevers/chills, erythema, induration, drainage, or persistent bleeding.   Images permanently stored and available for review in the ultrasound unit.  Impression: Technically successful ultrasound guided injection.  Lot number: [id number]     Assessment and Plan: 58 y.o. female with bilateral knee pain due to DJD.  Plan for Zilretta  injections today.  Could repeat this every 3 months if needed.   PDMP not reviewed this encounter. Orders Placed This Encounter  Procedures   US  LIMITED JOINT SPACE STRUCTURES LOW BILAT(NO LINKED CHARGES)    Reason for Exam (SYMPTOM  OR DIAGNOSIS REQUIRED):   bilateral knee OA    Preferred imaging location?:    [facility] ordered this encounter  Medications   Triamcinolone  Acetonide (ZILRETTA ) intra-articular injection 32 mg   Triamcinolone   Acetonide (ZILRETTA ) intra-articular injection 32 mg     Discussed warning signs or symptoms. Please see discharge instructions. Patient expresses understanding.   The above documentation has been reviewed and is accurate and  complete Artist Lloyd, M.D.

## 2024-11-13 NOTE — Patient Instructions (Signed)
 Thank you for coming in today.   You received an injection today. Seek immediate medical attention if the joint becomes red, extremely painful, or is oozing fluid.

## 2024-11-21 ENCOUNTER — Other Ambulatory Visit: Payer: Self-pay | Admitting: Internal Medicine

## 2024-11-21 LAB — HM MAMMOGRAPHY

## 2024-11-22 ENCOUNTER — Encounter: Payer: Self-pay | Admitting: Internal Medicine

## 2024-11-25 ENCOUNTER — Encounter: Payer: Self-pay | Admitting: Internal Medicine

## 2024-11-25 ENCOUNTER — Encounter: Payer: Self-pay | Admitting: Family Medicine

## 2024-11-25 DIAGNOSIS — G8929 Other chronic pain: Secondary | ICD-10-CM

## 2024-11-25 DIAGNOSIS — M170 Bilateral primary osteoarthritis of knee: Secondary | ICD-10-CM

## 2024-11-26 NOTE — Telephone Encounter (Signed)
 Bilat Zilretta  injections on 11/13/24.   Forwarding to Dr. Joane to review and advise.

## 2024-11-27 NOTE — Addendum Note (Signed)
 Addended by: JOANE ARTIST RAMAN on: 11/27/2024 07:19 AM   Modules accepted: Orders

## 2024-11-28 ENCOUNTER — Telehealth: Payer: Self-pay | Admitting: Family Medicine

## 2024-11-28 NOTE — Telephone Encounter (Signed)
 Montie with [employer] called and stated that she called to inform that patient has been enrolled in copay program and  once office received eob for insurance could we upload to the portal. Case ID [id number]. Call back number is [phone] ask for cynthia. They have additional questions.

## 2024-11-30 NOTE — Telephone Encounter (Signed)
 Called [employer] back to find out what additional information was needed from the office to proceed with this patient's case. Sherlean stated that the previous phone call was just to inform the office that the patient qualified for the co-pay program. However, to complete the paperwork for this savings card/ co-pay program, they would need a copy of the patient's EOB. I informed him that the office itself does not receive this information. He stated that maybe billing would have this information. [employer] would like a copy faxed to [phone].

## 2024-12-05 NOTE — Telephone Encounter (Signed)
 Tylenol  is the safest medicine you can take.

## 2024-12-14 ENCOUNTER — Other Ambulatory Visit (HOSPITAL_COMMUNITY)
Admission: RE | Admit: 2024-12-14 | Discharge: 2024-12-14 | Disposition: A | Discharge status: Home / Self Care | Source: Ambulatory Visit | Attending: Nurse Practitioner | Admitting: Nurse Practitioner

## 2024-12-14 ENCOUNTER — Ambulatory Visit (INDEPENDENT_AMBULATORY_CARE_PROVIDER_SITE_OTHER): Admitting: Nurse Practitioner

## 2024-12-14 ENCOUNTER — Encounter: Payer: Self-pay | Admitting: Nurse Practitioner

## 2024-12-14 VITALS — BP 120/82 | HR 96

## 2024-12-14 DIAGNOSIS — N950 Postmenopausal bleeding: Secondary | ICD-10-CM | POA: Diagnosis not present

## 2024-12-14 DIAGNOSIS — Z124 Encounter for screening for malignant neoplasm of cervix: Secondary | ICD-10-CM | POA: Insufficient documentation

## 2024-12-14 NOTE — Progress Notes (Signed)
" ° °  Acute Office Visit  Subjective:    Patient ID: Courtney Bates, female    DOB: March 30, 1966, 59 y.o.   MRN: [id number]   HPI 59 y.o. presents today for vaginal spotting a few weeks ago. Lasted a couple of days, only with wiping. Postmenopausal - no HRT. LMP 2018. Denies vaginal itching, discharge or odor. Not sexually active. Aunt being treated for uterine cancer. Normal pap history. Due now.   Patient's last menstrual period was 09/27/2017.    Review of Systems  Constitutional: Negative.   Genitourinary:  Positive for vaginal bleeding (Spotting - resolved). Negative for dysuria, flank pain, frequency, genital sores, hematuria, pelvic pain, urgency, vaginal discharge and vaginal pain.       Objective:    Physical Exam Exam conducted with a chaperone present.  Constitutional:      Appearance: Normal appearance. She is obese.  Genitourinary:    General: Normal vulva.     Vagina: Normal.     Cervix: Normal.     Uterus: Normal.      Adnexa: Right adnexa normal and left adnexa normal.     BP 120/82   Pulse 96   LMP 09/27/2017   SpO2 96%  Wt Readings from Last 3 Encounters:  11/13/24 (!) 304 lb (137.9 kg)  10/03/24 (!) 311 lb (141.1 kg)  09/11/24 (!) 312 lb (141.5 kg)        Zada Louder, CMA present as chaperone.   Assessment & Plan:   Problem List Items Addressed This Visit   None Visit Diagnoses       PMB (postmenopausal bleeding)    -  Primary   Relevant Orders   US  PELVIS TRANSVAGINAL NON-OB (TV ONLY)     Cervical cancer screening       Relevant Orders   Cytology - PAP( [facility])      Plan: Schedule ultrasound. Pap pending.     Annabella DELENA Shutter DNP, 3:42 PM 12/14/2024 "

## 2024-12-17 NOTE — Telephone Encounter (Signed)
 Courtney Bates checked into this and the recent EOB stated that the insurance paid everything for the visit except the patients copay. No need to send the EOB to [employer] to complete the co-pay program because the patient may not be approved for it since insurance did pay for everything.

## 2024-12-18 ENCOUNTER — Other Ambulatory Visit

## 2024-12-18 DIAGNOSIS — N950 Postmenopausal bleeding: Secondary | ICD-10-CM | POA: Diagnosis not present

## 2024-12-18 LAB — CYTOLOGY - PAP
Comment: NEGATIVE
Diagnosis: NEGATIVE
High risk HPV: NEGATIVE

## 2024-12-19 ENCOUNTER — Ambulatory Visit: Payer: Self-pay | Admitting: Nurse Practitioner

## 2024-12-19 DIAGNOSIS — N950 Postmenopausal bleeding: Secondary | ICD-10-CM

## 2024-12-19 DIAGNOSIS — R9389 Abnormal findings on diagnostic imaging of other specified body structures: Secondary | ICD-10-CM

## 2024-12-25 ENCOUNTER — Encounter: Payer: Self-pay | Admitting: Nurse Practitioner

## 2024-12-25 ENCOUNTER — Ambulatory Visit: Admitting: Nurse Practitioner

## 2024-12-25 ENCOUNTER — Other Ambulatory Visit (HOSPITAL_COMMUNITY)
Admission: RE | Admit: 2024-12-25 | Discharge: 2024-12-25 | Disposition: A | Discharge status: Home / Self Care | Source: Ambulatory Visit | Attending: Nurse Practitioner | Admitting: Nurse Practitioner

## 2024-12-25 VITALS — BP 118/80 | HR 103

## 2024-12-25 DIAGNOSIS — N950 Postmenopausal bleeding: Secondary | ICD-10-CM | POA: Diagnosis not present

## 2024-12-25 DIAGNOSIS — R9389 Abnormal findings on diagnostic imaging of other specified body structures: Secondary | ICD-10-CM | POA: Diagnosis present

## 2024-12-25 NOTE — Progress Notes (Signed)
" ° ° ° ° °  ENDOMETRIAL BIOPSY       Courtney Bates 59 y.o. presents for endometrial biopsy. Reason for biopsy: PMB, mildly thickened endo 4.78 mm.   The indications for endometrial biopsy were reviewed.    Risks of the biopsy including cramping, bleeding, infection, uterine perforation, inadequate specimen and need for additional procedures were discussed.  The patient states she understands and agrees to undergo procedure today. Consent obtained.  Time out was performed.   Procedure Speculum inserted into the vagina, cervix visualized and was prepped with Betadine. Sterile gloves applied. A single-toothed tenaculum was placed on the posterior lip of the cervix to stabilize it. Uterus sounded.  The 3 mm pipelle was introduced into the endometrial cavity without difficulty to a depth of 9 cm, suction initiated and a moderate amount of tissue was obtained and sent to pathology.  The instruments were removed from the patient's vagina.  Minimal bleeding from the cervix was noted.  The patient tolerated the procedure well.   Zada Louder, CMA present for exam  Assessment/Plan: PMB (postmenopausal bleeding) - Plan: Endometrial biopsy, Surgical pathology  Thickened endometrium - Plan: Endometrial biopsy, Surgical pathology  Routine post-procedure instructions were given to the patient.   Will contact with results of biopsy.    Annabella DELENA Shutter DNP, 2:47 PM 12/25/2024  "

## 2024-12-27 LAB — SURGICAL PATHOLOGY

## 2024-12-28 ENCOUNTER — Ambulatory Visit: Payer: Self-pay | Admitting: Nurse Practitioner

## 2025-02-07 ENCOUNTER — Ambulatory Visit: Payer: BLUE CROSS/BLUE SHIELD | Admitting: Nurse Practitioner
